# Patient Record
Sex: Female | Born: 1950 | Race: White | Hispanic: No | Smoking: Never smoker
Health system: Southern US, Community
[De-identification: ages and names within clinical notes are randomized; demographics above are authoritative.]

## PROBLEM LIST (undated history)

## (undated) DIAGNOSIS — F411 Generalized anxiety disorder: Secondary | ICD-10-CM

## (undated) DIAGNOSIS — E785 Hyperlipidemia, unspecified: Secondary | ICD-10-CM

## (undated) DIAGNOSIS — R61 Generalized hyperhidrosis: Secondary | ICD-10-CM

## (undated) DIAGNOSIS — J209 Acute bronchitis, unspecified: Secondary | ICD-10-CM

## (undated) DIAGNOSIS — E039 Hypothyroidism, unspecified: Secondary | ICD-10-CM

## (undated) DIAGNOSIS — R269 Unspecified abnormalities of gait and mobility: Secondary | ICD-10-CM

## (undated) DIAGNOSIS — G245 Blepharospasm: Secondary | ICD-10-CM

## (undated) DIAGNOSIS — C801 Malignant (primary) neoplasm, unspecified: Secondary | ICD-10-CM

## (undated) DIAGNOSIS — G4700 Insomnia, unspecified: Secondary | ICD-10-CM

## (undated) DIAGNOSIS — E669 Obesity, unspecified: Secondary | ICD-10-CM

## (undated) DIAGNOSIS — G244 Idiopathic orofacial dystonia: Secondary | ICD-10-CM

## (undated) DIAGNOSIS — K219 Gastro-esophageal reflux disease without esophagitis: Secondary | ICD-10-CM

## (undated) DIAGNOSIS — F329 Major depressive disorder, single episode, unspecified: Secondary | ICD-10-CM

## (undated) DIAGNOSIS — R002 Palpitations: Secondary | ICD-10-CM

## (undated) DIAGNOSIS — H269 Unspecified cataract: Secondary | ICD-10-CM

## (undated) HISTORY — DX: Insomnia, unspecified: G47.00

## (undated) HISTORY — PX: TONSILLECTOMY: SUR1361

## (undated) HISTORY — DX: Acute bronchitis, unspecified: J20.9

## (undated) HISTORY — DX: Obesity, unspecified: E66.9

## (undated) HISTORY — DX: Generalized anxiety disorder: F41.1

## (undated) HISTORY — DX: Hyperlipidemia, unspecified: E78.5

## (undated) HISTORY — DX: Major depressive disorder, single episode, unspecified: F32.9

## (undated) HISTORY — DX: Malignant (primary) neoplasm, unspecified: C80.1

## (undated) HISTORY — DX: Palpitations: R00.2

## (undated) HISTORY — DX: Blepharospasm: G24.5

## (undated) HISTORY — DX: Idiopathic orofacial dystonia: G24.4

## (undated) HISTORY — DX: Gastro-esophageal reflux disease without esophagitis: K21.9

## (undated) HISTORY — DX: Generalized hyperhidrosis: R61

## (undated) HISTORY — DX: Unspecified abnormalities of gait and mobility: R26.9

## (undated) HISTORY — DX: Hypothyroidism, unspecified: E03.9

## (undated) HISTORY — PX: COLONOSCOPY: SHX174

## (undated) HISTORY — DX: Unspecified cataract: H26.9

---

## 1997-08-14 HISTORY — PX: THYROIDECTOMY: SHX17

## 1999-03-31 ENCOUNTER — Other Ambulatory Visit: Admission: RE | Admit: 1999-03-31 | Discharge: 1999-03-31 | Payer: Self-pay | Admitting: Obstetrics & Gynecology

## 1999-12-19 ENCOUNTER — Ambulatory Visit (HOSPITAL_COMMUNITY): Admission: RE | Admit: 1999-12-19 | Discharge: 1999-12-19 | Payer: Self-pay | Admitting: Endocrinology

## 1999-12-19 ENCOUNTER — Encounter: Payer: Self-pay | Admitting: Endocrinology

## 2000-01-23 ENCOUNTER — Encounter: Payer: Self-pay | Admitting: Endocrinology

## 2000-01-23 ENCOUNTER — Ambulatory Visit (HOSPITAL_COMMUNITY): Admission: RE | Admit: 2000-01-23 | Discharge: 2000-01-23 | Payer: Self-pay | Admitting: Endocrinology

## 2000-02-09 ENCOUNTER — Other Ambulatory Visit: Admission: RE | Admit: 2000-02-09 | Discharge: 2000-02-09 | Payer: Self-pay | Admitting: Endocrinology

## 2000-03-05 ENCOUNTER — Encounter (INDEPENDENT_AMBULATORY_CARE_PROVIDER_SITE_OTHER): Payer: Self-pay | Admitting: *Deleted

## 2000-03-05 ENCOUNTER — Ambulatory Visit (HOSPITAL_COMMUNITY): Admission: RE | Admit: 2000-03-05 | Discharge: 2000-03-07 | Payer: Self-pay

## 2000-07-09 ENCOUNTER — Encounter: Payer: Self-pay | Admitting: Endocrinology

## 2000-07-09 ENCOUNTER — Ambulatory Visit (HOSPITAL_COMMUNITY): Admission: RE | Admit: 2000-07-09 | Discharge: 2000-07-09 | Payer: Self-pay | Admitting: Endocrinology

## 2000-07-11 ENCOUNTER — Ambulatory Visit (HOSPITAL_COMMUNITY): Admission: RE | Admit: 2000-07-11 | Discharge: 2000-07-11 | Payer: Self-pay | Admitting: Endocrinology

## 2001-07-01 ENCOUNTER — Ambulatory Visit (HOSPITAL_COMMUNITY): Admission: RE | Admit: 2001-07-01 | Discharge: 2001-07-01 | Payer: Self-pay | Admitting: Endocrinology

## 2001-07-01 ENCOUNTER — Encounter: Payer: Self-pay | Admitting: Endocrinology

## 2001-07-05 ENCOUNTER — Ambulatory Visit (HOSPITAL_COMMUNITY): Admission: RE | Admit: 2001-07-05 | Discharge: 2001-07-05 | Payer: Self-pay | Admitting: Endocrinology

## 2001-07-05 ENCOUNTER — Encounter: Payer: Self-pay | Admitting: Endocrinology

## 2002-02-25 ENCOUNTER — Other Ambulatory Visit: Admission: RE | Admit: 2002-02-25 | Discharge: 2002-02-25 | Payer: Self-pay | Admitting: Obstetrics & Gynecology

## 2002-05-12 ENCOUNTER — Encounter: Payer: Self-pay | Admitting: Endocrinology

## 2002-05-12 ENCOUNTER — Ambulatory Visit (HOSPITAL_COMMUNITY): Admission: RE | Admit: 2002-05-12 | Discharge: 2002-05-12 | Payer: Self-pay | Admitting: Endocrinology

## 2002-05-16 ENCOUNTER — Ambulatory Visit (HOSPITAL_COMMUNITY): Admission: RE | Admit: 2002-05-16 | Discharge: 2002-05-16 | Payer: Self-pay | Admitting: Endocrinology

## 2003-06-22 ENCOUNTER — Ambulatory Visit (HOSPITAL_COMMUNITY): Admission: RE | Admit: 2003-06-22 | Discharge: 2003-06-22 | Payer: Self-pay | Admitting: Specialist

## 2003-06-22 ENCOUNTER — Encounter (INDEPENDENT_AMBULATORY_CARE_PROVIDER_SITE_OTHER): Payer: Self-pay | Admitting: *Deleted

## 2003-06-22 ENCOUNTER — Ambulatory Visit (HOSPITAL_BASED_OUTPATIENT_CLINIC_OR_DEPARTMENT_OTHER): Admission: RE | Admit: 2003-06-22 | Discharge: 2003-06-22 | Payer: Self-pay | Admitting: Specialist

## 2003-12-15 ENCOUNTER — Other Ambulatory Visit: Admission: RE | Admit: 2003-12-15 | Discharge: 2003-12-15 | Payer: Self-pay | Admitting: Obstetrics & Gynecology

## 2004-06-21 ENCOUNTER — Ambulatory Visit: Payer: Self-pay | Admitting: Internal Medicine

## 2004-08-11 ENCOUNTER — Ambulatory Visit: Payer: Self-pay | Admitting: Internal Medicine

## 2004-11-03 ENCOUNTER — Ambulatory Visit: Payer: Self-pay | Admitting: Internal Medicine

## 2005-03-17 ENCOUNTER — Ambulatory Visit: Payer: Self-pay | Admitting: Internal Medicine

## 2005-03-17 ENCOUNTER — Ambulatory Visit (HOSPITAL_COMMUNITY): Admission: RE | Admit: 2005-03-17 | Discharge: 2005-03-17 | Payer: Self-pay | Admitting: Internal Medicine

## 2005-05-31 ENCOUNTER — Ambulatory Visit: Payer: Self-pay | Admitting: Internal Medicine

## 2005-06-15 ENCOUNTER — Other Ambulatory Visit: Admission: RE | Admit: 2005-06-15 | Discharge: 2005-06-15 | Payer: Self-pay | Admitting: Obstetrics & Gynecology

## 2005-07-13 ENCOUNTER — Inpatient Hospital Stay (HOSPITAL_COMMUNITY): Admission: EM | Admit: 2005-07-13 | Discharge: 2005-07-16 | Payer: Self-pay | Admitting: Emergency Medicine

## 2005-07-13 ENCOUNTER — Ambulatory Visit: Payer: Self-pay | Admitting: Internal Medicine

## 2005-07-24 ENCOUNTER — Ambulatory Visit: Payer: Self-pay | Admitting: Internal Medicine

## 2005-08-08 ENCOUNTER — Ambulatory Visit: Payer: Self-pay | Admitting: Internal Medicine

## 2005-08-14 DIAGNOSIS — A0472 Enterocolitis due to Clostridium difficile, not specified as recurrent: Secondary | ICD-10-CM

## 2005-08-14 HISTORY — DX: Enterocolitis due to Clostridium difficile, not specified as recurrent: A04.72

## 2005-08-14 LAB — HM COLONOSCOPY

## 2005-08-16 ENCOUNTER — Ambulatory Visit: Payer: Self-pay | Admitting: Internal Medicine

## 2005-08-23 ENCOUNTER — Ambulatory Visit: Payer: Self-pay | Admitting: Internal Medicine

## 2005-09-05 ENCOUNTER — Ambulatory Visit: Payer: Self-pay | Admitting: Internal Medicine

## 2005-10-05 ENCOUNTER — Ambulatory Visit: Payer: Self-pay | Admitting: Internal Medicine

## 2005-10-24 ENCOUNTER — Ambulatory Visit: Payer: Self-pay | Admitting: Internal Medicine

## 2005-11-08 ENCOUNTER — Ambulatory Visit: Payer: Self-pay | Admitting: Internal Medicine

## 2005-12-01 ENCOUNTER — Ambulatory Visit: Payer: Self-pay | Admitting: Internal Medicine

## 2005-12-01 ENCOUNTER — Encounter: Payer: Self-pay | Admitting: Internal Medicine

## 2005-12-01 ENCOUNTER — Encounter (INDEPENDENT_AMBULATORY_CARE_PROVIDER_SITE_OTHER): Payer: Self-pay | Admitting: Specialist

## 2006-01-01 ENCOUNTER — Ambulatory Visit: Payer: Self-pay | Admitting: Internal Medicine

## 2006-09-18 ENCOUNTER — Ambulatory Visit: Payer: Self-pay | Admitting: Internal Medicine

## 2007-01-17 ENCOUNTER — Ambulatory Visit: Payer: Self-pay | Admitting: Internal Medicine

## 2007-01-17 LAB — CONVERTED CEMR LAB
ALT: 20 units/L (ref 0–40)
AST: 22 units/L (ref 0–37)
Bacteria, UA: NEGATIVE
Basophils Relative: 0.6 % (ref 0.0–1.0)
Bilirubin Urine: NEGATIVE
Bilirubin, Direct: 0.1 mg/dL (ref 0.0–0.3)
Calcium: 9.3 mg/dL (ref 8.4–10.5)
Chloride: 109 meq/L (ref 96–112)
Cholesterol: 244 mg/dL (ref 0–200)
Creatinine, Ser: 0.8 mg/dL (ref 0.4–1.2)
Crystals: NEGATIVE
Eosinophils Relative: 3.9 % (ref 0.0–5.0)
GFR calc non Af Amer: 79 mL/min
Glucose, Bld: 96 mg/dL (ref 70–99)
HCT: 40.7 % (ref 36.0–46.0)
Hemoglobin: 14.5 g/dL (ref 12.0–15.0)
Lymphocytes Relative: 36.3 % (ref 12.0–46.0)
Monocytes Absolute: 0.6 10*3/uL (ref 0.2–0.7)
Monocytes Relative: 9.5 % (ref 3.0–11.0)
Neutro Abs: 2.9 10*3/uL (ref 1.4–7.7)
Neutrophils Relative %: 49.7 % (ref 43.0–77.0)
Nitrite: NEGATIVE
RDW: 12.6 % (ref 11.5–14.6)
Specific Gravity, Urine: 1.025 (ref 1.000–1.03)
Total Bilirubin: 0.9 mg/dL (ref 0.3–1.2)
Total CHOL/HDL Ratio: 7.8
Total Protein, Urine: NEGATIVE mg/dL
Urine Glucose: NEGATIVE mg/dL
Urobilinogen, UA: 0.2 (ref 0.0–1.0)
VLDL: 50 mg/dL — ABNORMAL HIGH (ref 0–40)

## 2007-01-30 ENCOUNTER — Ambulatory Visit: Payer: Self-pay | Admitting: Internal Medicine

## 2007-08-26 ENCOUNTER — Encounter: Payer: Self-pay | Admitting: Internal Medicine

## 2007-12-04 ENCOUNTER — Encounter: Payer: Self-pay | Admitting: Internal Medicine

## 2008-02-21 ENCOUNTER — Encounter: Payer: Self-pay | Admitting: Internal Medicine

## 2008-03-17 ENCOUNTER — Ambulatory Visit: Payer: Self-pay | Admitting: Internal Medicine

## 2008-03-17 DIAGNOSIS — E669 Obesity, unspecified: Secondary | ICD-10-CM

## 2008-03-17 DIAGNOSIS — R61 Generalized hyperhidrosis: Secondary | ICD-10-CM

## 2008-03-17 DIAGNOSIS — F329 Major depressive disorder, single episode, unspecified: Secondary | ICD-10-CM

## 2008-03-17 DIAGNOSIS — F411 Generalized anxiety disorder: Secondary | ICD-10-CM

## 2008-03-17 DIAGNOSIS — F3289 Other specified depressive episodes: Secondary | ICD-10-CM

## 2008-03-17 DIAGNOSIS — F4323 Adjustment disorder with mixed anxiety and depressed mood: Secondary | ICD-10-CM | POA: Insufficient documentation

## 2008-03-17 DIAGNOSIS — E039 Hypothyroidism, unspecified: Secondary | ICD-10-CM

## 2008-03-17 DIAGNOSIS — G4700 Insomnia, unspecified: Secondary | ICD-10-CM | POA: Insufficient documentation

## 2008-03-17 DIAGNOSIS — F419 Anxiety disorder, unspecified: Secondary | ICD-10-CM | POA: Insufficient documentation

## 2008-03-17 HISTORY — DX: Insomnia, unspecified: G47.00

## 2008-03-17 HISTORY — DX: Generalized anxiety disorder: F41.1

## 2008-03-17 HISTORY — DX: Generalized hyperhidrosis: R61

## 2008-03-17 HISTORY — DX: Other specified depressive episodes: F32.89

## 2008-03-17 HISTORY — DX: Major depressive disorder, single episode, unspecified: F32.9

## 2008-03-17 HISTORY — DX: Obesity, unspecified: E66.9

## 2008-03-17 HISTORY — DX: Hypothyroidism, unspecified: E03.9

## 2008-03-23 ENCOUNTER — Ambulatory Visit: Payer: Self-pay | Admitting: Internal Medicine

## 2008-03-24 LAB — CONVERTED CEMR LAB
Alkaline Phosphatase: 81 units/L (ref 39–117)
Bilirubin Urine: NEGATIVE
Bilirubin, Direct: 0.1 mg/dL (ref 0.0–0.3)
CO2: 28 meq/L (ref 19–32)
Crystals: NEGATIVE
GFR calc Af Amer: 95 mL/min
Glucose, Bld: 98 mg/dL (ref 70–99)
HDL: 32.3 mg/dL — ABNORMAL LOW (ref >39.0)
Lymphocytes Relative: 37.7 % (ref 12.0–46.0)
Monocytes Relative: 6.8 % (ref 3.0–12.0)
Mucus, UA: NEGATIVE
Neutrophils Relative %: 51.7 % (ref 43.0–77.0)
Nitrite: NEGATIVE
Platelets: 340 10*3/uL (ref 150–400)
Potassium: 4.1 meq/L (ref 3.5–5.1)
RDW: 12.7 % (ref 11.5–14.6)
Sodium: 144 meq/L (ref 135–145)
Total CHOL/HDL Ratio: 7
Total Protein: 7.2 g/dL (ref 6.0–8.3)
Triglycerides: 336 mg/dL (ref 0–149)
pH: 5.5 (ref 5.0–8.0)

## 2008-10-01 ENCOUNTER — Ambulatory Visit: Payer: Self-pay | Admitting: Internal Medicine

## 2008-10-01 DIAGNOSIS — J209 Acute bronchitis, unspecified: Secondary | ICD-10-CM

## 2008-10-01 DIAGNOSIS — R002 Palpitations: Secondary | ICD-10-CM

## 2008-10-01 HISTORY — DX: Palpitations: R00.2

## 2008-10-01 HISTORY — DX: Acute bronchitis, unspecified: J20.9

## 2009-03-31 ENCOUNTER — Encounter: Payer: Self-pay | Admitting: Internal Medicine

## 2009-06-11 ENCOUNTER — Ambulatory Visit: Payer: Self-pay | Admitting: Internal Medicine

## 2009-06-11 DIAGNOSIS — G245 Blepharospasm: Secondary | ICD-10-CM

## 2009-06-11 DIAGNOSIS — R269 Unspecified abnormalities of gait and mobility: Secondary | ICD-10-CM

## 2009-06-11 HISTORY — DX: Unspecified abnormalities of gait and mobility: R26.9

## 2009-06-11 HISTORY — DX: Blepharospasm: G24.5

## 2009-06-11 LAB — CONVERTED CEMR LAB: Vit D, 25-Hydroxy: 37 ng/mL (ref 30–89)

## 2009-06-14 LAB — CONVERTED CEMR LAB
ALT: 27 units/L (ref 0–35)
BUN: 12 mg/dL (ref 6–23)
Basophils Relative: 0.9 % (ref 0.0–3.0)
Chloride: 104 meq/L (ref 96–112)
Cholesterol: 218 mg/dL — ABNORMAL HIGH (ref 0–200)
Eosinophils Relative: 4.3 % (ref 0.0–5.0)
HCT: 39.9 % (ref 36.0–46.0)
Hemoglobin: 13.7 g/dL (ref 12.0–15.0)
Lymphs Abs: 2.7 10*3/uL (ref 0.7–4.0)
MCV: 97.4 fL (ref 78.0–100.0)
Monocytes Absolute: 0.5 10*3/uL (ref 0.1–1.0)
Neutro Abs: 4.2 10*3/uL (ref 1.4–7.7)
Platelets: 287 10*3/uL (ref 150.0–400.0)
Potassium: 4 meq/L (ref 3.5–5.1)
RBC: 4.1 M/uL (ref 3.87–5.11)
Total Protein: 7.6 g/dL (ref 6.0–8.3)
Triglycerides: 329 mg/dL — ABNORMAL HIGH (ref 0.0–149.0)
WBC: 7.8 10*3/uL (ref 4.5–10.5)

## 2009-06-29 ENCOUNTER — Ambulatory Visit: Payer: Self-pay | Admitting: Internal Medicine

## 2009-07-05 ENCOUNTER — Telehealth (INDEPENDENT_AMBULATORY_CARE_PROVIDER_SITE_OTHER): Payer: Self-pay | Admitting: *Deleted

## 2009-07-12 ENCOUNTER — Encounter: Payer: Self-pay | Admitting: Internal Medicine

## 2010-06-24 ENCOUNTER — Ambulatory Visit: Payer: Self-pay | Admitting: Internal Medicine

## 2010-08-02 ENCOUNTER — Encounter: Payer: Self-pay | Admitting: Internal Medicine

## 2010-09-11 LAB — CONVERTED CEMR LAB: TSH: 0.08 microintl units/mL — ABNORMAL LOW (ref 0.35–5.50)

## 2010-09-13 NOTE — Assessment & Plan Note (Signed)
Summary: rx refill-lb   Vital Signs:  Patient profile:   60 year old female Height:      64 inches Weight:      187 pounds BMI:     32.21 Temp:     98.4 degrees F oral Pulse rate:   72 / minute Pulse rhythm:   regular Resp:     16 per minute BP sitting:   114 / 86  (left arm) Cuff size:   regular  Vitals Entered By: Lanier Prude, Beverly Gust) (June 24, 2010 4:48 PM) CC: f/u Is Patient Diabetic? No Comments pt needs rf on Levothyroxine   CC:  f/u.  History of Present Illness: F/u hypothyroidism   Current Medications (verified): 1)  Levothyroxine Sodium 125 Mcg Tabs (Levothyroxine Sodium) .[name].. 1 By Mouth Once Daily 2)  Alprazolam 0.5 Mg  Tabs (Alprazolam) .... 1/2 Tab Once Daily Prn 3)  Vitamin D3 1000 Unit  Tabs (Cholecalciferol) .[name].. 1 By Mouth Daily 4)  Tylenol Pm Extra Strength 500-25 Mg  Tabs (Diphenhydramine-Apap (Sleep)) .[name].. 1 At [facility] Prn 5)  Carbidopa-Levodopa 10-100 Mg Tabs (Carbidopa-Levodopa) .[name].. 1 By Mouth Bid 6)  Lexapro 10 Mg Tabs (Escitalopram Oxalate) .... Take 1 Tab By Mouth Daily 7)  Tussionex Pennkinetic Er 8-10 Mg/10ml Lqcr (Chlorpheniramine-Hydrocodone) .... 5 Ml By Mouth Bib As Needed Cough  Allergies (verified): 1)  ! Penicillin V Potassium (Penicillin V Potassium)  Review of Systems       Lost wt on diet  Physical Exam  General:  alert and less overweight-appearing Mouth:  Erythematous throat mucosa and intranasal erythema.  Neck:  No deformities, masses, or tenderness noted. Lungs:  Normal respiratory effort, chest expands symmetrically. Lungs are clear to auscultation, no crackles or wheezes. Heart:  Normal rate and regular rhythm. S1 and S2 normal without gallop, murmur, click, rub or other extra sounds. Extremities:  no edema   Impression & Recommendations:  Problem # 1:  HYPOTHYROIDISM (ICD-244.9) Assessment Unchanged  Her updated medication list for this problem includes:    Levothyroxine Sodium 125 Mcg Tabs (Levothyroxine  sodium) .[name]... 1 by mouth once daily  Complete Medication List: 1)  Levothyroxine Sodium 125 Mcg Tabs (Levothyroxine sodium) .[name].. 1 by mouth once daily 2)  Alprazolam 0.5 Mg Tabs (Alprazolam) .... 1/2 tab once daily prn 3)  Vitamin D3 1000 Unit Tabs (Cholecalciferol) .[name].. 1 by mouth daily 4)  Tylenol Pm Extra Strength 500-25 Mg Tabs (Diphenhydramine-apap (sleep)) .[name].. 1 at hs prn 5)  Zoloft 100 Mg Tabs (Sertraline hcl) .[name].. 1 by mouth qd  Patient Instructions: 1)  Please schedule a follow-up appointment in 3 months well w/labs. Prescriptions: LEVOTHYROXINE SODIUM 125 MCG TABS (LEVOTHYROXINE SODIUM) 1 by mouth once daily  #90 Tablet x 3   Entered and Authorized by:   Tresa Garter MD   Signed by:   Tresa Garter MD on 06/24/2010   Method used:   [facility]        [facility] [id number]       [address]       [place], [place]  [address]       Ph: [phone] or [phone]       Fax: [phone]   RxID:   [id number]    Orders Added: 1)  Est. Patient Level III [10626]   Immunization History:  Influenza Immunization History:    Influenza:  historical (04/28/2010)   Immunization History:  Influenza Immunization History:    Influenza:  Historical (04/28/2010)

## 2010-09-23 ENCOUNTER — Encounter (INDEPENDENT_AMBULATORY_CARE_PROVIDER_SITE_OTHER): Payer: Self-pay | Admitting: *Deleted

## 2010-09-23 ENCOUNTER — Other Ambulatory Visit: Payer: BC Managed Care – PPO

## 2010-09-23 ENCOUNTER — Other Ambulatory Visit: Payer: Self-pay | Admitting: Internal Medicine

## 2010-09-23 DIAGNOSIS — E785 Hyperlipidemia, unspecified: Secondary | ICD-10-CM

## 2010-09-23 DIAGNOSIS — Z0000 Encounter for general adult medical examination without abnormal findings: Secondary | ICD-10-CM

## 2010-09-23 LAB — BASIC METABOLIC PANEL
CO2: 30 mEq/L (ref 19–32)
Calcium: 9.3 mg/dL (ref 8.4–10.5)
Chloride: 106 mEq/L (ref 96–112)
Sodium: 142 mEq/L (ref 135–145)

## 2010-09-23 LAB — CBC WITH DIFFERENTIAL/PLATELET
Basophils Relative: 0.6 % (ref 0.0–3.0)
Eosinophils Absolute: 0.2 10*3/uL (ref 0.0–0.7)
Eosinophils Relative: 3.5 % (ref 0.0–5.0)
Hemoglobin: 14.1 g/dL (ref 12.0–15.0)
Lymphocytes Relative: 43.7 % (ref 12.0–46.0)
MCHC: 35.1 g/dL (ref 30.0–36.0)
MCV: 95.5 fl (ref 78.0–100.0)
Neutro Abs: 2.9 10*3/uL (ref 1.4–7.7)
RBC: 4.2 Mil/uL (ref 3.87–5.11)
WBC: 6.5 10*3/uL (ref 4.5–10.5)

## 2010-09-23 LAB — URINALYSIS
Bilirubin Urine: NEGATIVE
Hgb urine dipstick: NEGATIVE
Ketones, ur: NEGATIVE
Leukocytes, UA: NEGATIVE
Nitrite: NEGATIVE
pH: 8 (ref 5.0–8.0)

## 2010-09-23 LAB — LIPID PANEL
Cholesterol: 219 mg/dL — ABNORMAL HIGH (ref 0–200)
HDL: 36.6 mg/dL — ABNORMAL LOW (ref >39.00)
Total CHOL/HDL Ratio: 6
Triglycerides: 203 mg/dL — ABNORMAL HIGH (ref 0.0–149.0)
VLDL: 40.6 mg/dL — ABNORMAL HIGH (ref 0.0–40.0)

## 2010-09-23 LAB — HEPATIC FUNCTION PANEL
ALT: 17 U/L (ref 0–35)
Albumin: 3.8 g/dL (ref 3.5–5.2)
Alkaline Phosphatase: 65 U/L (ref 39–117)
Total Protein: 6.7 g/dL (ref 6.0–8.3)

## 2010-09-26 ENCOUNTER — Encounter: Payer: Self-pay | Admitting: Internal Medicine

## 2010-09-26 ENCOUNTER — Encounter (INDEPENDENT_AMBULATORY_CARE_PROVIDER_SITE_OTHER): Payer: BC Managed Care – PPO | Admitting: Internal Medicine

## 2010-09-26 DIAGNOSIS — Z0000 Encounter for general adult medical examination without abnormal findings: Secondary | ICD-10-CM

## 2010-09-27 ENCOUNTER — Encounter: Payer: Self-pay | Admitting: Internal Medicine

## 2010-10-11 NOTE — Assessment & Plan Note (Signed)
Summary: CPX  BCBS   #  CD   Vital Signs:  Patient profile:   60 year old female Weight:      182 pounds O2 Sat:      98 % Pulse rate:   72 / minute BP sitting:   120 / 80  History of Present Illness: The patient presents for a preventive health examination   Allergies: 1)  ! Penicillin V Potassium (Penicillin V Potassium)  Past History:  Social History: Last updated: 03/17/2008 Occupation: HR manager Married Never Smoked  Past Medical History: Blepharospasm  Dr Debarah Crape Anxiety Depression Dr Nolen Mu Hypothyroidism, post XRT, surgery  Dr Juleen China (Thyroid ca 1999)  Past Surgical History: Thyroidectomy 1999  Family History: Family History Hypertension Family History Depression Family History Diabetes 1st degree relative  Review of Systems  The patient denies anorexia, fever, weight loss, weight gain, vision loss, decreased hearing, hoarseness, chest pain, syncope, dyspnea on exertion, peripheral edema, prolonged cough, headaches, hemoptysis, abdominal pain, melena, hematochezia, severe indigestion/heartburn, hematuria, incontinence, genital sores, muscle weakness, suspicious skin lesions, transient blindness, difficulty walking, depression, unusual weight change, abnormal bleeding, enlarged lymph nodes, angioedema, and breast masses.         Lost wt on diet  Physical Exam  General:  alert and less overweight-appearing Head:  Normocephalic and atraumatic without obvious abnormalities. No apparent alopecia or balding. Eyes:  No corneal or conjunctival inflammation noted. EOMI. Perrla. Ears:  External ear exam shows no significant lesions or deformities.  Otoscopic examination reveals clear canals, tympanic membranes are intact bilaterally without bulging, retraction, inflammation or discharge. Hearing is grossly normal bilaterally. Nose:  External nasal examination shows no deformity or inflammation. Nasal mucosa are pink and moist without lesions or exudates. Mouth:   Erythematous throat mucosa and intranasal erythema.  Neck:  No deformities, masses, or tenderness noted. Lungs:  Normal respiratory effort, chest expands symmetrically. Lungs are clear to auscultation, no crackles or wheezes. Heart:  Normal rate and regular rhythm. S1 and S2 normal without gallop, murmur, click, rub or other extra sounds. Abdomen:  Bowel sounds positive,abdomen soft and non-tender without masses, organomegaly or hernias noted. Msk:  No deformity or scoliosis noted of thoracic or lumbar spine.   Pulses:  R and L carotid,radial,femoral,dorsalis pedis and posterior tibial pulses are full and equal bilaterally Extremities:  No clubbing, cyanosis, edema, or deformity noted with normal full range of motion of all joints.   Neurologic:  No cranial nerve deficits noted. Station and gait are normal. Plantar reflexes are down-going bilaterally. DTRs are symmetrical throughout. Sensory, motor and coordinative functions appear intact. Skin:  Intact without suspicious lesions or rashes Cervical Nodes:  No lymphadenopathy noted Psych:  Cognition and judgment appear intact. Alert and cooperative with normal attention span and concentration. No apparent delusions, illusions, hallucinations   Impression & Recommendations:  Problem # 1:  HEALTH MAINTENANCE EXAM (ICD-V70.0) Health and age related issues were discussed. Available screening tests and vaccinations were discussed as well. Healthy life style including good diet and exercise was discussed.  The labs were reviewed with the patient.  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 1 by mouth once daily Her updated medication list for this problem includes:    Synthroid  100 Mcg Tabs (Levothyroxine sodium) .Marland KitchenMarland KitchenMarland KitchenMarland Kitchen  1 by mouth qd  Problem # 4:  DEPRESSION (ICD-311) Assessment: Improved  Her updated medication list for this problem includes:    Alprazolam 0.5 Mg Tabs (Alprazolam) .[name]... 1/2 tab once daily prn    Zoloft 100 Mg Tabs (Sertraline hcl) .[name]... 1 by mouth qd  Problem # 5:  ANXIETY (ICD-300.00) Assessment: Improved  Her updated medication list for this problem includes:    Alprazolam 0.5 Mg Tabs (Alprazolam) .[name]... 1/2 tab once daily prn    Zoloft 100 Mg Tabs (Sertraline hcl) .[name]... 1 by mouth qd  Problem # 6:  SWEATING (ICD-780.8) Assessment: Improved  Complete Medication List: 1)  Alprazolam 0.5 Mg Tabs (Alprazolam) .... 1/2 tab once daily prn 2)  Zoloft 100 Mg Tabs (Sertraline hcl) .[name].. 1 by mouth qd 3)  Synthroid 100 Mcg Tabs (Levothyroxine sodium) .[name].. 1 by mouth qd 4)  Zolpidem Tartrate 10 Mg Tabs (Zolpidem tartrate) .... 1/2-1 tab at bedtime as needed insomnia 5)  Vitamin D3 1000 Unit Tabs (Cholecalciferol) .[name].. 1 by mouth daily 6)  Tylenol Pm Extra Strength 500-25 Mg Tabs (Diphenhydramine-apap (sleep)) .[name].. 1 at hs prn 7)  Senokot S 8.6-50 Mg Tabs (Sennosides-docusate sodium) .[name].. 1-2 a day 8)  Miralax Powd (Polyethylene glycol 3350) .[name].. 1 scoop once daily as needed for constipation  Patient Instructions: 1)  TSH prior to visit, ICD-9:244.80 2)  Please schedule a follow-up appointment in 6 months. 3)  Valerian root Prescriptions: SYNTHROID 100 MCG TABS (LEVOTHYROXINE SODIUM) 1 by mouth qd Brand medically necessary #30 x 11   Entered and Authorized by:   Tresa Garter MD   Signed by:   Tresa Garter MD on 09/26/2010   Method used:   [facility]        [facility] [id number]       [address]       [place], [place]  [address]       Ph: [phone] or [phone]       Fax: [phone]   RxID:   [id number] MIRALAX  POWD (POLYETHYLENE GLYCOL 3350) 1 scoop once daily as needed for constipation   #1 x 12   Entered and Authorized by:   Tresa Garter MD   Signed by:   Tresa Garter MD on 09/26/2010   Method used:   [facility]        [phone][facility] (retail)       [address]       [place], [place]  [address]       Ph: [phone]       Fax: [phone]   RxID:   [id number] SYNTHROID 100 MCG TABS (LEVOTHYROXINE SODIUM) 1 by mouth qd Brand medically necessary #30 x 11   Entered and Authorized by:   Tresa Garter MD   Signed by:   Tresa Garter MD on 09/26/2010   Method used:   [facility]        [facility] [phone] (retail[facility]       [address]       [place], [place]  [address]       Ph: [phone]       Fax: [phone]   RxID:   [id number]    Orders Added: 1)  Est. Patient 40-64 years [[address]]    Contraindications/Deferment of Procedures/Staging:    Test/Procedure: DPT vaccine  Reason for deferment: declined

## 2010-10-26 ENCOUNTER — Other Ambulatory Visit: Payer: BC Managed Care – PPO

## 2010-12-27 NOTE — Assessment & Plan Note (Signed)
[facility]                           PRIMARY CARE OFFICE NOTE   NAME:Linda Hayes, Linda Hayes                       MRN:          [id number]  DATE:01/30/2007                            DOB:          1951-07-03    The patient is a 60 year old female who presents for a wellness  examination.   PAST MEDICAL HISTORY:  Severe C.diff colitis a couple of years ago,  history of panic attacks, remote history of rheumatic fever, irritable  bowel disease, GERD with hiatal hernia, fibromyalgia, history of thyroid  cancer in 2001, status post thyroidectomy, history of cervical  dysplasia, status post conization, a remote history of mononucleosis.   FAMILY HISTORY:  Father with coronary disease, diabetes, deceased.  Mother is alive with hypertension, depression, and anxiety.   CURRENT MEDICATIONS:  1. Claritin p.r.n.  2. Synthroid 150 mg daily.  3. Zoloft 100 mg daily.   SOCIAL HISTORY:  She does not smoke or drink.  She is married.  Her  stepson was killed in a car accident 2 weeks ago.   HISTORY OF PRESENT ILLNESS:  She has been stressed out lately, gaining  weight, not able to exercise.  Denies chest pain, shortness of breath,  no syncope, no neurological complaints.  The rest of the 18-point review  of systems is negative.  She does have regular GYN care by Dr. Aldona Bar.   PHYSICAL EXAMINATION:  Blood pressure 119/65, pulse 85, temp 98.3,  weight 200 pounds.  She looks well.  HEENT:  Moist mucosa.  NECK:  Supple, no thyromegaly or bruit.  LUNGS:  Clear to auscultation and percussion, no wheezes or rales.  CARDIAC:  S1, S2, no murmur, no gallop.  ABDOMEN:  Soft, nontender, no organomegaly, no mass.  EXTREMITIES:  Lower extremities without edema.  NEUROLOGIC:  She is alert, oriented, and cooperative.  Denies being  depressed.  NECK:  Supple, no masses.  SKIN:  Clear.   LABORATORY DATA:  On January 17, 2007, CBC normal, BMET normal, cholesterol  244, LDL of 160,  TSH 0.15.   EKG with normal sinus rhythm.   Urinalysis normal.   ASSESSMENT AND PLAN:  1. Normal wellness examination.  Age/health related issues discussed.      Healthy lifestyle discussed. Regular GYN care with Dr. Aldona Bar.      Mammogram every year.  She is up to date on colonoscopy exam.      Advised a Tetanus shot.  She will check at home when she had it      last.  I will see her back in 3 months.  2. Weight gain due to stress-related overeating.  She has been on      Zoloft chronically.  She will try to stay on low-calorie      diet/smaller portions.  Will start phentermine 37.5 mg daily in the      morning #30 with 2 refills.  The risks, side effects, benefits, all      discussed.  She will discontinue if chest pain, shortness of      breath, syncope, weakness, etc.  3. Hypothyroidism  status post thyroidectomy for thyroid cancer in      2001.  She has been seeing Dr. Juleen China.  At present will reduce      Synthroid to 137 mcg per day and will recheck TSH in 2-3 months.     Georgina Quint. Plotnikov, MD  Electronically Signed    AVP/MedQ  DD: 01/31/2007  DT: 01/31/2007  Job #: [id number]   cc:   Gerrit Friends. Aldona Bar, M.D.  Brooke Bonito, M.D.

## 2010-12-30 NOTE — Discharge Summary (Signed)
[name], [name] NO.:  [id number]   MEDICAL RECORD NO.:  [id number]          PATIENT TYPE:  INP   LOCATION:  1326                         FACILITY:  [facility]   PHYSICIAN:  Wanda Plump, MD [facility]    DATE OF BIRTH:  12/31/1950   DATE OF ADMISSION:  07/13/2005  DATE OF DISCHARGE:  07/16/2005                                 DISCHARGE SUMMARY   PRIMARY CARE PHYSICIAN:  Georgina Quint. Plotnikov, M.D.   ADMISSION DIAGNOSIS:  Diarrhea, abdominal pain and vomiting.   DISCHARGE DIAGNOSIS:  C Dif colitis.   BRIEF HISTORY AND PHYSICAL:  [name] is a 60 year old female who  developed nausea, vomiting, and diarrhea with mucus, abdominal pain, fever  and chills before the admission to the hospital. She was undergoing an  evaluation by Dr. Brunilda Payor for recurrent urinary tract infection and a CAT scan  was done that revealed pancolitis.   PHYSICAL EXAMINATION:  VITAL SIGNS:  Temperature 101.2, heart rate 110,  respirations 10.  LUNGS:  Clear.  CARDIOVASCULAR:  Regular rate and rhythm.  ABDOMEN:  Soft, very sensitive throughout but rebound.   LABORATORY AND X-RAYS:  C Dif of the abdomen showed pancolitis. Initial CBC  showed a white count of 16.4, hemoglobin of 13.6, platelets of 334. Followup  CBC showed a white count of 10.8 with a hemoglobin of 11, platelets of 257.  The decrease of hemoglobin is thought to be dilutional. Potassium was 3.7,  creatinine 0.9. LFTs were normal. Stool cultures showed C Dif. Other  cultures are pending. We also did white blood cells in the stools and they  were negative. Sed rate was 40 which is high, TSH was normal.   HOSPITAL COURSE:  [name] was admitted to the hospital with pancolitis.  She was started on IV fluids and supportive care. She was prescribed Cipro  and Flagyl. Once the culture showed C Dif, Cipro was discontinued. She also  had a great deal of emotional distress. She was continued with Zoloft and  placed on ativan and this  subsided during this admission. On further  questioning, she did have antibiotics in the recent past for a UTI. Her  father who is now deceased was diagnosed with C Diff. a few weeks ago. At  the time of discharge, she is doing much better but she will be discharged  home with the following instructions:   1.  Continue with Zoloft and Synthroid as before.  2.  Flagyl 500 mg 1 p.o. b.i.d. for the next 7 days to complete 10 day      treatment.  3.  Call your primary care doctor if the symptoms resurface.  4.  Do soft diet including soup, crackers and ginger ale for a few days.      Wanda Plump, MD [facility]  Electronically Signed     JEP/MEDQ  D:  07/16/2005  T:  07/17/2005  Job:  [id number]   cc:   Georgina Quint. Plotnikov, M.D. [facility]  [address]  [place]  [place] [address]

## 2010-12-30 NOTE — H&P (Signed)
[name], Hayes NO.:  [id number]   MEDICAL RECORD NO.:  [id number]          PATIENT TYPE:  EMS   LOCATION:  ED                           FACILITY:  [facility]   PHYSICIAN:  Linda Hayes, M.D. LHCDATE OF BIRTH:  08-10-1951   DATE OF ADMISSION:  07/13/2005  DATE OF DISCHARGE:                                HISTORY & PHYSICAL   CHIEF COMPLAINT:  Severe diarrhea, abdominal pain, vomiting.   HISTORY OF PRESENT ILLNESS:  The patient is a 60 year old female who got  acutely sick on Monday/Tuesday this week with nausea, vomiting x2, and  frequent stools with mucus, abdominal pain, chills, fever, occasional stool  incontinence, nonstop for 3 days. She has been getting progressively worse  with weakness, unable to keep any of her medicines down. She was seen today  by Dr. Brunilda Payor for recurrent urinary tract infection and had a CAT scan done  which revealed pancolitis. Dr. Brunilda Payor called me and asked me to see the  patient.   ALLERGIES:  PENICILLIN and ERYTHROMYCIN.   CURRENT MEDICINES:  Ripley Fraise for a bladder infection by Dr. Aldona Bar for a  week or so.  She is currently on Zoloft 150 mg daily and Synthroid 150 mg  daily; however, unable to keep them down over the past couple of days.   PAST MEDICAL HISTORY:  Depression.   FAMILY HISTORY:  Positive for diabetes and coronary disease.   REVIEW OF SYSTEMS:  Recurrent urinary tract infections lately. No previous  problems with diarrhea or inflammatory bowel disease. Has been extremely  upset lately about the death of her father and the suicide of her nephew a  week ago. Has been having nervous breakdowns. No chest pain, no syncope. The  rest is negative.   PHYSICAL EXAMINATION:  VITAL SIGNS:  Temperature 101.2, heart rate 110,  respirations 18, blood pressure not done, weight 177 pounds.  GENERAL:  She is in mild to moderate acute distress. She is extremely  tearful and upset, appears to be in emotional distress.  HEENT:  With dry oral mucosa.  NECK:  Supple, no meningeal signs.  LUNGS:  Clear, no wheezes or rales.  HEART:  S1, S2, tachycardic.  ABDOMEN:  Soft, sensitive throughout. No rebound symptoms, no masses felt.  LOWER EXTREMITIES:  Without edema.  NEUROLOGIC:  She is alert, oriented, cooperative, very depressed and  tearful.   LABORATORY DATA:  CT scan of the abdomen with pancolitis per Dr. Brunilda Payor. I was  not able to open the CD with my computer.   ASSESSMENT AND PLAN:  1.  Severe diarrhea with pancolitis on CAT scan. Etiology unclear. Obtain C.      difficile, clear liquids, start Flagyl. May need a GI consult.  2.  Pancolitis. Plan as above.  3.  Dehydration. Will treat with IV fluids.  4.  Emotional breakdown/severe grief reaction. Will restart Zoloft, give      Ativan IV.  5.  Abdominal pain due to problem #1. Will use morphine as needed.  6.  Frequent recent urinary tract infections. Under the care of Dr. Brunilda Payor.  ______________________________  Linda Quint Hayes, M.D. [facility]     AVP/MEDQ  D:  07/13/2005  T:  07/13/2005  Job:  [id number]   cc:   Linda Hayes, M.D.  Fax: [phone]   Gerrit Friends. Aldona Bar, M.D.  Fax: [phone]

## 2010-12-30 NOTE — Op Note (Signed)
[place]  Patient:    Linda Hayes, Linda Hayes                       MRN: [id number] Proc. Date: 03/05/00 Adm. Date:  [id number] Attending:  Gennie Alma Dictator:   [phone] CC:         Bernadene Person, M.D.             Amil Amen L. Lenon Ahmadi, M.D.                           Operative Report  CCS# [id number]  PREOPERATIVE DIAGNOSIS:  Dominant cold nodule of left lobe of thyroid.  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  OPERATION PERFORMED:  Total thyroidectomy.  SURGEON:  Milus Mallick, M.D.  ASSISTANT:  Dr. Chevis Pretty.  ANESTHESIA:  General endotracheal.  INDICATIONS FOR PROCEDURE:  Under adequate general endotracheal anesthesia, the patients neck was extended over a shoulder roll.  The neck was prepared and draped in the usual fashion.  The low collar incision was made and carried down through the platysma muscle.  Superior and inferior platysmal flaps were developed using a Bovie electrocoagulation.  The Mahorner self-retaining retractor was inserted.  The middle cervical fascia was exposed and midline was incised longitudinally.  The left-sided strap muscles were retracted off of the left thyroid lobe and the surgical plane of the thyroid was entered. By blunt and sharp dissection, the lobe was freed up.  A middle thyroid vein was divided over quadruple clip technique.  This gave more mobility to the left lobe which was able to be retracted medially and rotated anteriorly. Almost immediately we saw the left inferior parathyroid gland and it was totally within the capsule of the thyroid.  It was excised and placed in saline pending pathologic study of the thyroid nodule.  There was a very large thyroid nodule in the lower pole of the thyroid gland that was approximately 3 to 3.5 cm in diameter.  It was quite soft, however.  Next, superior pole vessels were approached.  The sternothyroid  muscle was dissected off of the superior pole of the thyroid using Bovie electrocoagulation.  The superior pole vessels were then controlled with ligature and clip on the stay side and ligature on the specimen side. Ligatures were 2-0 silk.  This gave great mobility to the left thyroid lobe. The recurrent laryngeal nerve was identified and spared of any injury.  The branches of the inferior thyroid artery were divided over small hemoclips. The attachments of the lobe to the thyroid gland were divided over small hemoclips and also with Bovie electrocoagulation until the isthmus was dissected off the anterior wall of the trachea.  The isthmus was divided using a hemostat.  The hemostat was controlled with horizontal mattress suture of 4-0 Vicryl.  Specimen was sent for frozen section study and was found to be most likely follicular variant of papillary carcinoma.  We then proceeded to do a right thyroid lobectomy as well.  The strap muscles were dissected off of the right thyroid lobe and the thyroid was freed up from the surrounding tissue.  The middle thyroid vein was divided over triple hemoclip technique giving more mobility to the lobe.  The right inferior parathyroid gland was also within the capsule of the thyroid gland and was devascularized as the lobe was dissected up.  It was  saved in iced saline to be autotransplanted at the end of the procedure.  The superior pole vessles were handled similarly on the right side as on the left side.  This freed up the superior pole and gave great mobility to the gland.  The superior parathyroid gland was also identified and it also was intimately involved with the superior pole of the thyroid and some of the thyroid tissue was left adjacent to it so as to protect its blood supply.  The right recurrent laryngeal nerve was identified and spared of any injury.  The anterior thyroid artery branches were divided over small Hemoclips close to the  gland.  The gland was then dissected off of the trachea using electrocoagulation and in some instances, Hemoclips.  The specimen was removed from the operative field.  Hemostasis was ascertained.  Small bleeders around the left recurrent laryngeal nerve were clipped with small Hemoclips.  Some Surgicel was placed adjacent to the recurrent laryngeal nerve on the left. Both the paratracheal fossae were irrigated with sterile saline solution until clear.  The strap muscles were reapproximated in the midline with continuous suture of 3-0 Vicryl.  The right anterior parathyroid gland was then inserted into the right sternocleidomastoid muscle after mincing it into approximately four or five pieces.  The muscle was closed with a figure-of-eight of 4-0 Vicryl.  The platysma layer was reapproximated with interrupted sutures of 4-0 Vicryl and the skin was approximated with a generic skin stapler 35-W.  A sterile dressing was applied.  Estimated blood loss for this procedure was approximately 100 cc.  The patient tolerated the procedure well and left the operating room in satisfactory condition.  DESCRIPTION OF PROCEDURE: DD:  03/05/00 TD:  03/06/00 Job: [id number]

## 2010-12-30 NOTE — Op Note (Signed)
NAMEKHADEJA, ABT                          ACCOUNT NO.:  [id number]   MEDICAL RECORD NO.:  [id number]                   PATIENT TYPE:  AMB   LOCATION:  DSC                                  FACILITY:  [facility]   PHYSICIAN:  Earvin Hansen L. Shon Hough, M.D.           DATE OF BIRTH:  March 25, 1951   DATE OF PROCEDURE:  06/22/2003  DATE OF DISCHARGE:                                 OPERATIVE REPORT   OPERATION PERFORMED:  Bilateral breast reductions using the inferior pedicle  technique.   SURGEON:  Yaakov Guthrie. Shon Hough, M.D.   ASSISTANT:  Alethia Berthold, CFA-PAO   ANESTHESIA:  General.   INDICATIONS FOR PROCEDURE:  The patient is a 61 year old lady who had severe  macromastia, back and shoulder pain secondary to large pendulous breasts,  has intertrigo, pitting of both shoulders, has used lotions and other types  of treatments for the intertriginous changes under her breast tissue.  She  wears special bras and has had increased pain secondary to the pitting of  both shoulders.   DESCRIPTION OF PROCEDURE:  The patient was taken to the operating room.  In  the sitting position, she had drawings done for the inferior pedicle  reduction mammoplasty, re-marking nipple areolar complexes back up to 20 cm  from the suprasternal notch.  The patient demonstrated increased asymmetry  and severe macromastia with the right breast larger than the left.  She also  has accessory breast tissue in the right and left axillary and latissimus  dorsi regions.  She then underwent general anesthesia, intubated orally.  Prep was done to the chest, breast areas in the routine fashion and abdomen  using Hibiclens solution, walled off with sterile towels and drapes so as to  make as sterile field.  0.25% Xylocaine with epinephrine was injected  locally, per side 1:400,000 concentration.  This was allowed to set up  and then the wounds were then scored with #15 blades.  The skin over the  inferior pedicle was  de-epithelialized with #20 blades.  After this, the  medial and lateral fatty dermal pedicles were excised down to underlying  fascia. Again, hemostasis was maintained with the Bovie unit on coagulation.  Next, a new key hole area was debulked and out more laterally, more  accessory breast tissue was removed.  After proper hemostasis the flaps were  transposed and stayed with 3-0 Prolene.  Subcutaneous closure was done with  3-0 Monocryl times two levels and then a running subcuticular stitch of 3-0  Monocryl and 5-0 Monocryl throughout the inverted T. The wounds were drained  with a #10 Blake drain fully fluted, placed in the depths of the wound and  brought out through the lateralmost portion of the incision secured with 3-0  Prolene.  The same procedure was carried out on both sides removing over  100g on the left and over another 100g on the right.  The wounds were  cleansed, Steri-Strips and soft dressings were applied to all the areas  including 4 x 4's ABD's, HypaFix and Xeroform gauze.  She withstood the  procedures very well, left the operating room in good condition, nipple  areolar complexes supple, with excellent blood supply.                                               Yaakov Guthrie. Shon Hough, M.D.    Cathie Hoops  D:  06/22/2003  T:  06/22/2003  Job:  [id number]

## 2011-02-14 ENCOUNTER — Other Ambulatory Visit: Payer: Self-pay | Admitting: Obstetrics & Gynecology

## 2011-03-28 ENCOUNTER — Ambulatory Visit: Payer: BC Managed Care – PPO | Admitting: Internal Medicine

## 2011-05-01 ENCOUNTER — Telehealth: Payer: Self-pay | Admitting: *Deleted

## 2011-05-01 ENCOUNTER — Other Ambulatory Visit (INDEPENDENT_AMBULATORY_CARE_PROVIDER_SITE_OTHER): Payer: BC Managed Care – PPO

## 2011-05-01 ENCOUNTER — Encounter: Payer: Self-pay | Admitting: Internal Medicine

## 2011-05-01 ENCOUNTER — Ambulatory Visit (INDEPENDENT_AMBULATORY_CARE_PROVIDER_SITE_OTHER): Payer: BC Managed Care – PPO | Admitting: Internal Medicine

## 2011-05-01 VITALS — BP 122/76 | HR 74 | Temp 98.4°F | Wt 195.0 lb

## 2011-05-01 DIAGNOSIS — F411 Generalized anxiety disorder: Secondary | ICD-10-CM

## 2011-05-01 DIAGNOSIS — E039 Hypothyroidism, unspecified: Secondary | ICD-10-CM

## 2011-05-01 DIAGNOSIS — J019 Acute sinusitis, unspecified: Secondary | ICD-10-CM

## 2011-05-01 MED ORDER — LEVOFLOXACIN 500 MG PO TABS: 500.0000 mg | ORAL_TABLET | Freq: Every day | ORAL | Status: AC

## 2011-05-01 NOTE — Patient Instructions (Signed)
Take all new medications as prescribed Continue all other medications as before  

## 2011-05-01 NOTE — Assessment & Plan Note (Addendum)
stable overall by hx and exam but c/w hyperthyroid,thyroid med reduced feb 2012, most recent data reviewed with pt, and pt to continue medical treatment as before, f/u as planned with repeat tsh  Lab Results  Component Value Date   TSH 0.04* 09/23/2010

## 2011-05-01 NOTE — Assessment & Plan Note (Signed)
stable overall by hx and exam, most recent data reviewed with pt, and pt to continue medical treatment as before  Lab Results  Component Value Date   WBC 6.5 09/23/2010   HGB 14.1 09/23/2010   HCT 40.1 09/23/2010   PLT 291.0 09/23/2010   CHOL 219* 09/23/2010   TRIG 203.0* 09/23/2010   HDL 36.60* 09/23/2010   LDLDIRECT 147.8 09/23/2010   ALT 17 09/23/2010   AST 20 09/23/2010   NA 142 09/23/2010   K 4.4 09/23/2010   CL 106 09/23/2010   CREATININE 0.8 09/23/2010   BUN 12 09/23/2010   CO2 30 09/23/2010   TSH 0.04* 09/23/2010

## 2011-05-01 NOTE — Progress Notes (Signed)
  Subjective:    Patient ID: Linda Hayes, female    DOB: 03-21-1951, 60 y.o.   MRN: [id number]  HPI   Here with 3 days acute onset fever, facial pain, pressure, general weakness and malaise, and greenish d/c, with slight ST, but little to no cough and Pt denies chest pain, increased sob or doe, wheezing, orthopnea, PND, increased LE swelling, palpitations, dizziness or syncope. May have had some increased allergy symtpoms in the past 2 wks, but not clear as she has not been bothered in the past.  Pt denies chest pain, increased sob or doe, wheezing, orthopnea, PND, increased LE swelling, palpitations, dizziness or syncope.  Pt denies new neurological symptoms such as new headache, or facial or extremity weakness or numbness   Pt denies polydipsia, polyuria. Denies hyper or hypo thyroid symptoms such as voice, skin or hair change.  Past Medical History  Diagnosis Date  . ANXIETY 03/17/2008  . ASTHMATIC BRONCHITIS, ACUTE 10/01/2008  . 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 DEPRESSION 03/17/2008  . GAIT ATAXIA 06/11/2009  . HYPOTHYROIDISM 03/17/2008  . INSOMNIA, PERSISTENT 03/17/2008  . OBESITY 03/17/2008  . Palpitations 10/01/2008  . SWEATING 03/17/2008   Past Surgical History  Procedure Date  . Thyroidectomy 1999    reports that she has never smoked. She does not have any smokeless tobacco history on file. Her alcohol and drug histories not on file. family history includes Depression in her other; Diabetes in her other; and Hypertension in her other. Allergies  Allergen Reactions  . Penicillins    No current outpatient prescriptions on file prior to visit.   Review of Systems Review of Systems  Constitutional: Negative for diaphoresis and unexpected weight change.  HENT: Negative for drooling and tinnitus.   Eyes: Negative for photophobia and visual disturbance.  Respiratory: Negative for choking and stridor.   Gastrointestinal: Negative for vomiting and blood in stool.  Genitourinary: Negative for  hematuria and decreased urine volume.     Objective:   Physical Exam BP 122/76  Pulse 74  Temp(Src) 98.4 F (36.9 C) (Oral)  Wt 195 lb (88.451 kg)  SpO2 97% Physical Exam  VS noted, mild ill appearing Constitutional: Pt appears well-developed and well-nourished.  HENT: Head: Normocephalic.  Right Ear: External ear normal.  Left Ear: External ear normal.  Bilat tm's mild erythema.  Sinus tender bilat.  Pharynx mild erythema Eyes: Conjunctivae and EOM are normal. Pupils are equal, round, and reactive to light.  Neck: Normal range of motion. Neck supple.  Cardiovascular: Normal rate and regular rhythm.   Pulmonary/Chest: Effort normal and breath sounds normal.  Neurological: Pt is alert. No cranial nerve deficit.  Skin: Skin is warm. No erythema.  Psychiatric: Pt behavior is normal. Thought content normal.         Assessment & Plan:

## 2011-05-01 NOTE — Telephone Encounter (Signed)
Fu labs

## 2011-05-08 ENCOUNTER — Ambulatory Visit: Payer: BC Managed Care – PPO | Admitting: Internal Medicine

## 2011-10-17 ENCOUNTER — Other Ambulatory Visit: Payer: Self-pay | Admitting: Internal Medicine

## 2012-04-25 ENCOUNTER — Other Ambulatory Visit: Payer: Self-pay | Admitting: Internal Medicine

## 2012-05-24 ENCOUNTER — Encounter: Payer: Self-pay | Admitting: Internal Medicine

## 2012-05-24 ENCOUNTER — Other Ambulatory Visit: Payer: Self-pay | Admitting: Internal Medicine

## 2012-05-24 ENCOUNTER — Ambulatory Visit (INDEPENDENT_AMBULATORY_CARE_PROVIDER_SITE_OTHER): Payer: BC Managed Care – PPO | Admitting: Internal Medicine

## 2012-05-24 VITALS — BP 124/90 | HR 72 | Temp 97.2°F | Resp 16 | Wt 196.0 lb

## 2012-05-24 DIAGNOSIS — B029 Zoster without complications: Secondary | ICD-10-CM

## 2012-05-24 DIAGNOSIS — E039 Hypothyroidism, unspecified: Secondary | ICD-10-CM

## 2012-05-24 DIAGNOSIS — R21 Rash and other nonspecific skin eruption: Secondary | ICD-10-CM

## 2012-05-24 DIAGNOSIS — M545 Low back pain, unspecified: Secondary | ICD-10-CM

## 2012-05-24 MED ORDER — ACYCLOVIR 800 MG PO TABS: 800.0000 mg | ORAL_TABLET | Freq: Every day | ORAL | Status: DC

## 2012-05-24 MED ORDER — TRIAMCINOLONE ACETONIDE 0.5 % EX CREA: TOPICAL_CREAM | Freq: Three times a day (TID) | CUTANEOUS | Status: DC

## 2012-05-24 MED ORDER — GABAPENTIN 100 MG PO CAPS: 100.0000 mg | ORAL_CAPSULE | Freq: Three times a day (TID) | ORAL | Status: DC | PRN

## 2012-05-24 MED ORDER — IBUPROFEN 600 MG PO TABS: 600.0000 mg | ORAL_TABLET | Freq: Three times a day (TID) | ORAL | Status: DC | PRN

## 2012-05-24 NOTE — Progress Notes (Signed)
Subjective:    Patient ID: Linda Hayes, female    DOB: Aug 01, 1951, 61 y.o.   MRN: [id number]  Rash This is a new problem. The current episode started in the past 7 days. The problem has been gradually worsening since onset. The affected locations include the back (L LS spine). The rash is characterized by blistering, burning and itchiness. She was exposed to nothing. Pertinent negatives include no congestion, cough, diarrhea, eye pain, fatigue, fever, rhinorrhea, shortness of breath or sore throat. The treatment provided no relief. There is no history of eczema.       Past Medical History  Diagnosis Date  . ANXIETY 03/17/2008  . ASTHMATIC BRONCHITIS, ACUTE 10/01/2008  . 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 DEPRESSION 03/17/2008  . GAIT ATAXIA 06/11/2009  . HYPOTHYROIDISM 03/17/2008  . INSOMNIA, PERSISTENT 03/17/2008  . OBESITY 03/17/2008  . Palpitations 10/01/2008  . SWEATING 03/17/2008   Past Surgical History  Procedure Date  . Thyroidectomy 1999    reports that she has never smoked. She does not have any smokeless tobacco history on file. Her alcohol and drug histories not on file. family history includes Depression in her other; Diabetes in her other; and Hypertension in her other. Allergies  Allergen Reactions  . Penicillins    Current Outpatient Prescriptions on File Prior to Visit  Medication Sig Dispense Refill  . ALPRAZolam (XANAX) 0.5 MG tablet 1/2 tablet once daily prn       . cholecalciferol (VITAMIN D) 1000 UNITS tablet Take 1,000 Units by mouth daily.        . sennosides-docusate sodium (SENOKOT-S) 8.6-50 MG tablet Take 1 tablet by mouth daily.        . sertraline (ZOLOFT) 100 MG tablet Take 100 mg by mouth daily.        [name] zolpidem (AMBIEN) 10 MG tablet 1/2 - 1 tablet at bedtime as needed for insomnia       . DISCONTD: SYNTHROID 100 MCG tablet TAKE 1 TABLET BY MOUTH EVERY DAY  30 tablet  0  . diphenhydramine-acetaminophen (TYLENOL PM) 25-500 MG TABS Take 1 tablet by mouth at bedtime  as needed.        . polyethylene glycol powder (GLYCOLAX/MIRALAX) powder Take 17 g by mouth daily.         Review of Systems  Constitutional: Negative.  Negative for fever, chills, diaphoresis, activity change, appetite change, fatigue and unexpected weight change.  HENT: Negative for hearing loss, ear pain, nosebleeds, congestion, sore throat, facial swelling, rhinorrhea, sneezing, mouth sores, trouble swallowing, neck pain, neck stiffness, postnasal drip, sinus pressure and tinnitus.   Eyes: Negative for pain, discharge, redness, itching and visual disturbance.  Respiratory: Negative for cough, chest tightness, shortness of breath, wheezing and stridor.   Cardiovascular: Negative for chest pain, palpitations and leg swelling.  Gastrointestinal: Negative for nausea, diarrhea, constipation, blood in stool, abdominal distention, anal bleeding and rectal pain.  Genitourinary: Negative for dysuria, urgency, frequency, hematuria, flank pain, vaginal bleeding, vaginal discharge, difficulty urinating, genital sores and pelvic pain.  Musculoskeletal: Positive for back pain. Negative for joint swelling, arthralgias and gait problem.  Skin: Positive for rash.  Neurological: Negative for dizziness, tremors, seizures, syncope, speech difficulty, weakness, numbness and headaches.  Hematological: Negative for adenopathy. Does not bruise/bleed easily.  Psychiatric/Behavioral: Negative for suicidal ideas, behavioral problems, disturbed wake/sleep cycle, dysphoric mood and decreased concentration. The patient is not nervous/anxious.        Objective:   Physical Exam  Constitutional: She appears well-developed. No  distress.  HENT:  Head: Normocephalic.  Right Ear: External ear normal.  Left Ear: External ear normal.  Nose: Nose normal.  Mouth/Throat: Oropharynx is clear and moist.  Eyes: Conjunctivae normal are normal. Pupils are equal, round, and reactive to light. Right eye exhibits no discharge. Left  eye exhibits no discharge.  Neck: Normal range of motion. Neck supple. No JVD present. No tracheal deviation present. No thyromegaly present.  Cardiovascular: Normal rate, regular rhythm and normal heart sounds.   Pulmonary/Chest: No stridor. No respiratory distress. She has no wheezes.  Abdominal: Soft. Bowel sounds are normal. She exhibits no distension and no mass. There is no tenderness. There is no rebound and no guarding.  Musculoskeletal: She exhibits no edema and no tenderness.  Lymphadenopathy:    She has no cervical adenopathy.  Neurological: She displays normal reflexes. No cranial nerve deficit. She exhibits normal muscle tone. Coordination normal.  Skin: Rash (R LS spine with  a large area of erythema with vesicles) noted. No erythema.  Psychiatric: She has a normal mood and affect. Her behavior is normal. Judgment and thought content normal.   BP 124/90  Pulse 72  Temp 97.2 F (36.2 C) (Oral)  Resp 16  Wt 196 lb (88.905 kg)         Assessment & Plan:

## 2012-05-26 ENCOUNTER — Encounter: Payer: Self-pay | Admitting: Internal Medicine

## 2012-05-27 DIAGNOSIS — B029 Zoster without complications: Secondary | ICD-10-CM | POA: Insufficient documentation

## 2012-05-27 DIAGNOSIS — R21 Rash and other nonspecific skin eruption: Secondary | ICD-10-CM | POA: Insufficient documentation

## 2012-05-27 DIAGNOSIS — M545 Low back pain, unspecified: Secondary | ICD-10-CM | POA: Insufficient documentation

## 2012-05-27 NOTE — Assessment & Plan Note (Signed)
See meds 

## 2012-05-27 NOTE — Assessment & Plan Note (Signed)
Gabapentin Ibuprofen

## 2012-05-27 NOTE — Assessment & Plan Note (Signed)
Acyclovir x 10 d 

## 2012-06-18 ENCOUNTER — Other Ambulatory Visit (INDEPENDENT_AMBULATORY_CARE_PROVIDER_SITE_OTHER): Payer: BC Managed Care – PPO

## 2012-06-18 DIAGNOSIS — E039 Hypothyroidism, unspecified: Secondary | ICD-10-CM

## 2012-06-18 LAB — TSH: TSH: 2.18 u[IU]/mL (ref 0.35–5.50)

## 2012-06-18 LAB — BASIC METABOLIC PANEL
BUN: 17 mg/dL (ref 6–23)
Calcium: 9.1 mg/dL (ref 8.4–10.5)
Creatinine, Ser: 0.8 mg/dL (ref 0.4–1.2)

## 2012-07-05 ENCOUNTER — Ambulatory Visit (INDEPENDENT_AMBULATORY_CARE_PROVIDER_SITE_OTHER): Payer: BC Managed Care – PPO | Admitting: Internal Medicine

## 2012-07-05 ENCOUNTER — Telehealth: Payer: Self-pay | Admitting: Internal Medicine

## 2012-07-05 ENCOUNTER — Encounter: Payer: Self-pay | Admitting: Internal Medicine

## 2012-07-05 VITALS — BP 124/84 | HR 90 | Temp 97.6°F | Ht 64.0 in | Wt 197.0 lb

## 2012-07-05 DIAGNOSIS — L2089 Other atopic dermatitis: Secondary | ICD-10-CM

## 2012-07-05 DIAGNOSIS — R21 Rash and other nonspecific skin eruption: Secondary | ICD-10-CM

## 2012-07-05 DIAGNOSIS — L209 Atopic dermatitis, unspecified: Secondary | ICD-10-CM

## 2012-07-05 MED ORDER — PREDNISONE 10 MG PO TABS: 10.0000 mg | ORAL_TABLET | Freq: Every day | ORAL | Status: DC

## 2012-07-05 NOTE — Progress Notes (Signed)
Subjective:    Patient ID: Linda Hayes, female    DOB: March 27, 1951, 61 y.o.   MRN: [id number]  HPI  Pt presents to the clinic with c/o rash. The rash is located behind both of her knees. The rash is very itchy, but sore and painful to touch. She has concerns that this may be shingles. She was seen by Dr. Posey Rea a couple of weeks ago for the same type of rash. He gave her triamcinolone cream and Zovirax. The rash went away, and has not had any problems until this new rash developed. She does take hot bathes everyday and feels like the rash is worse after that. She has been applying some of the triamcinolone cream to the area but the rash has not gone away completely. She does not have a history of allergies or asthma.   Review of Systems  Past Medical History  Diagnosis Date  . ANXIETY 03/17/2008  . ASTHMATIC BRONCHITIS, ACUTE 10/01/2008  . 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 DEPRESSION 03/17/2008  . GAIT ATAXIA 06/11/2009  . HYPOTHYROIDISM 03/17/2008  . INSOMNIA, PERSISTENT 03/17/2008  . OBESITY 03/17/2008  . Palpitations 10/01/2008  . SWEATING 03/17/2008    Current Outpatient Prescriptions  Medication Sig Dispense Refill  . acyclovir (ZOVIRAX) 800 MG tablet Take 1 tablet (800 mg total) by mouth 5 (five) times daily.  50 tablet  0  . ALPRAZolam (XANAX) 0.5 MG tablet 1/2 tablet once daily prn       . cholecalciferol (VITAMIN D) 1000 UNITS tablet Take 1,000 Units by mouth daily.        [name] ibuprofen (ADVIL,MOTRIN) 600 MG tablet Take 1 tablet (600 mg total) by mouth every 8 (eight) hours as needed for pain.  60 tablet  0  . sertraline (ZOLOFT) 100 MG tablet Take 100 mg by mouth daily.        [name] SYNTHROID 100 MCG tablet TAKE 1 TABLET BY MOUTH EVERY DAY  30 tablet  5  . triamcinolone cream (KENALOG) 0.5 % Apply topically 3 (three) times daily.  60 g  1  . zolpidem (AMBIEN) 10 MG tablet 1/2 - 1 tablet at bedtime as needed for insomnia       . gabapentin (NEURONTIN) 100 MG capsule Take 1 capsule (100 mg  total) by mouth 3 (three) times daily as needed (pain).  90 capsule  3  . predniSONE (DELTASONE) 10 MG tablet Take 1 tablet (10 mg total) by mouth daily.  10 tablet  0    Allergies  Allergen Reactions  . Penicillins     Family History  Problem Relation Age of Onset  . Hypertension Other   . Depression Other   . Diabetes Other     History   Social History  . Marital Status: Married    Spouse Name: N/A    Number of Children: N/A  . Years of Education: N/A   Occupational History  . Not on file.   Social History Main Topics  . Smoking status: Never Smoker   . Smokeless tobacco: Not on file  . Alcohol Use: Not on file  . Drug Use: Not on file  . Sexually Active: Not on file   Other Topics Concern  . Not on file   Social History Narrative  . No narrative on file     Constitutional: Denies fever, malaise, fatigue, headache or abrupt weight changes.  Skin: Pt reports red rash on the back of both knees. Denies lesions or ulcercations.   No other  specific complaints in a complete review of systems (except as listed in HPI above).     Objective:   Physical Exam  BP 124/84  Pulse 90  Temp 97.6 F (36.4 C) (Oral)  Ht 5\' 4"  (1.626 m)  Wt 197 lb (89.359 kg)  BMI 33.82 kg/m2  SpO2 95% Wt Readings from Last 3 Encounters:  07/05/12 197 lb (89.359 kg)  05/24/12 196 lb (88.905 kg)  05/01/11 195 lb (88.451 kg)    General: Appears her stated age, well developed, well nourished in NAD. Skin:  Erythematous rash with grouped papules noted on bilateral posterior knees. No lesions or ulcerations noted. Cardiovascular: Normal rate and rhythm. S1,S2 noted.  No murmur, rubs or gallops noted. No JVD or BLE edema. No carotid bruits noted. Pulmonary/Chest: Normal effort and positive vesicular breath sounds. No respiratory distress. No wheezes, rales or ronchi noted.       Assessment & Plan:   Atopic Dermatitis, new onset with additional workup required:  Avoid hot baths. Pat  the area dry after bathing. Use moisturizing cream such as Eucerin which can be bought OTC. Continue applying triamcinolone cream to the area, call for refill if needed. Prednisone 10 mg x 10 days.  RTC if symptoms persist or get worse.

## 2012-07-05 NOTE — Telephone Encounter (Signed)
Patient Information:  Caller Name: Ceazia  Phone: [phone]  Patient: Linda Hayes, Linda Hayes  Gender: Female  DOB: 07-04-1951  Age: 61 Years  PCP: Plotnikov, Alex (Adults only)   Symptoms  Reason For Call & Symptoms: shingles?  seen a month ago and took zovirax and cream.  Reviewed Health History In EMR: Yes  Reviewed Medications In EMR: Yes  Reviewed Allergies In EMR: Yes  Date of Onset of Symptoms: 07/02/2012  Guideline(s) Used:  Rash or Redness - Localized  Disposition Per Guideline:   See Today in Office  Reason For Disposition Reached:   Localized rash is very painful (no fever)  Advice Given:  N/A  Office Follow Up:  Does the office need to follow up with this patient?: No  Instructions For The Office: N/A  Appointment Scheduled:  07/05/2012 11:00:00  RN Note:  Appt scheduled 07/05/12 1100 with Ms. Sampson Si, as Dr. Loren Racer schedule is full krs/can

## 2012-07-05 NOTE — Patient Instructions (Addendum)

## 2012-11-12 ENCOUNTER — Ambulatory Visit: Payer: BC Managed Care – PPO | Admitting: Internal Medicine

## 2012-11-12 ENCOUNTER — Ambulatory Visit (INDEPENDENT_AMBULATORY_CARE_PROVIDER_SITE_OTHER): Payer: BC Managed Care – PPO | Admitting: Internal Medicine

## 2012-11-12 ENCOUNTER — Encounter: Payer: Self-pay | Admitting: Internal Medicine

## 2012-11-12 VITALS — BP 100/70 | HR 68 | Temp 98.0°F | Resp 16 | Wt 199.0 lb

## 2012-11-12 DIAGNOSIS — J019 Acute sinusitis, unspecified: Secondary | ICD-10-CM

## 2012-11-12 MED ORDER — DOXYCYCLINE HYCLATE 100 MG PO TABS: 100.0000 mg | ORAL_TABLET | Freq: Two times a day (BID) | ORAL | Status: DC

## 2012-11-12 MED ORDER — METHYLPREDNISOLONE ACETATE 80 MG/ML IJ SUSP
80.0000 mg | Freq: Once | INTRAMUSCULAR | Status: AC
  Administered 2012-11-12: 80 mg via INTRAMUSCULAR

## 2012-11-12 NOTE — Progress Notes (Signed)
Patient ID: Linda Hayes, female   DOB: [birth year], 62 y.o.   MRN: [id number]   Subjective:    Patient ID: Linda Hayes, female    DOB: May 11, 1951, 62 y.o.   MRN: [id number]  Sinusitis This is a new problem. The current episode started 1 to 4 weeks ago. The problem has been gradually worsening since onset. There has been no fever. The pain is mild. Associated symptoms include ear pain. Pertinent negatives include no chills, diaphoresis, headaches, neck pain, sinus pressure or sneezing. Past treatments include oral decongestants.  Otalgia  There is pain in the right ear. The current episode started 1 to 4 weeks ago. Pertinent negatives include no headaches, hearing loss or neck pain. There is no history of a chronic ear infection.       Past Medical History  Diagnosis Date  . ANXIETY 03/17/2008  . ASTHMATIC BRONCHITIS, ACUTE 10/01/2008  . 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 DEPRESSION 03/17/2008  . GAIT ATAXIA 06/11/2009  . HYPOTHYROIDISM 03/17/2008  . INSOMNIA, PERSISTENT 03/17/2008  . OBESITY 03/17/2008  . Palpitations 10/01/2008  . SWEATING 03/17/2008   Past Surgical History  Procedure Laterality Date  . Thyroidectomy  1999    reports that she has never smoked. She does not have any smokeless tobacco history on file. Her alcohol and drug histories are not on file. family history includes Depression in her other; Diabetes in her other; and Hypertension in her other. Allergies  Allergen Reactions  . Penicillins    Current Outpatient Prescriptions on File Prior to Visit  Medication Sig Dispense Refill  . ALPRAZolam (XANAX) 0.5 MG tablet 1/2 tablet once daily prn       . cholecalciferol (VITAMIN D) 1000 UNITS tablet Take 1,000 Units by mouth daily.        [name] ibuprofen (ADVIL,MOTRIN) 600 MG tablet Take 1 tablet (600 mg total) by mouth every 8 (eight) hours as needed for pain.  60 tablet  0  . predniSONE (DELTASONE) 10 MG tablet Take 1 tablet (10 mg total) by mouth daily.  10 tablet  0  . sertraline  (ZOLOFT) 100 MG tablet Take 100 mg by mouth daily.        [name] SYNTHROID 100 MCG tablet TAKE 1 TABLET BY MOUTH EVERY DAY  30 tablet  5  . triamcinolone cream (KENALOG) 0.5 % Apply topically 3 (three) times daily.  60 g  1  . zolpidem (AMBIEN) 10 MG tablet 1/2 - 1 tablet at bedtime as needed for insomnia       . acyclovir (ZOVIRAX) 800 MG tablet Take 1 tablet (800 mg total) by mouth 5 (five) times daily.  50 tablet  0  . gabapentin (NEURONTIN) 100 MG capsule Take 1 capsule (100 mg total) by mouth 3 (three) times daily as needed (pain).  90 capsule  3   No current facility-administered medications on file prior to visit.   Review of Systems  Constitutional: Negative.  Negative for chills, diaphoresis, activity change, appetite change and unexpected weight change.  HENT: Positive for ear pain. Negative for hearing loss, nosebleeds, facial swelling, sneezing, mouth sores, trouble swallowing, neck pain, neck stiffness, postnasal drip, sinus pressure and tinnitus.   Eyes: Negative for discharge, redness, itching and visual disturbance.  Respiratory: Negative for chest tightness, wheezing and stridor.   Cardiovascular: Negative for chest pain, palpitations and leg swelling.  Gastrointestinal: Negative for nausea, constipation, blood in stool, abdominal distention, anal bleeding and rectal pain.  Genitourinary: Negative for dysuria, urgency, frequency, hematuria,  flank pain, vaginal bleeding, vaginal discharge, difficulty urinating, genital sores and pelvic pain.  Musculoskeletal: Positive for back pain. Negative for joint swelling, arthralgias and gait problem.  Neurological: Negative for dizziness, tremors, seizures, syncope, speech difficulty, weakness, numbness and headaches.  Hematological: Negative for adenopathy. Does not bruise/bleed easily.  Psychiatric/Behavioral: Negative for suicidal ideas, behavioral problems, sleep disturbance, dysphoric mood and decreased concentration. The patient is not  nervous/anxious.        Objective:   Physical Exam  Constitutional: She appears well-developed. No distress.  HENT:  Head: Normocephalic.  Right Ear: External ear normal.  Left Ear: External ear normal.  Nose: Nose normal.  Mouth/Throat: Oropharynx is clear and moist.  eryth nasal mucosa L upper eyelid is droopy  Eyes: Conjunctivae are normal. Pupils are equal, round, and reactive to light. Right eye exhibits no discharge. Left eye exhibits no discharge.  Neck: Normal range of motion. Neck supple. No JVD present. No tracheal deviation present. No thyromegaly present.  Cardiovascular: Normal rate, regular rhythm and normal heart sounds.   Pulmonary/Chest: No stridor. No respiratory distress. She has no wheezes.  Abdominal: Soft. Bowel sounds are normal. She exhibits no distension and no mass. There is no tenderness. There is no rebound and no guarding.  Musculoskeletal: She exhibits no edema and no tenderness.  Lymphadenopathy:    She has no cervical adenopathy.  Neurological: She displays normal reflexes. No cranial nerve deficit. She exhibits normal muscle tone. Coordination normal.  blepharospasms  Skin: Rash (R LS spine with  a large area of erythema with vesicles) noted. No erythema.  Psychiatric: She has a normal mood and affect. Her behavior is normal. Judgment and thought content normal.   BP 100/70  Pulse 68  Temp(Src) 98 F (36.7 C) (Oral)  Resp 16  Wt 199 lb (90.266 kg)  BMI 34.14 kg/m2         Assessment & Plan:

## 2012-11-12 NOTE — Assessment & Plan Note (Addendum)
Doxy x 10 d Depo 80 mg im

## 2012-11-12 NOTE — Patient Instructions (Addendum)

## 2012-11-12 NOTE — Addendum Note (Signed)
Addended by: Merrilyn Puma on: 11/12/2012 03:18 PM   Modules accepted: Orders

## 2012-11-15 ENCOUNTER — Other Ambulatory Visit: Payer: Self-pay | Admitting: Internal Medicine

## 2013-03-08 ENCOUNTER — Emergency Department: Payer: Self-pay | Admitting: Emergency Medicine

## 2013-03-08 LAB — URINALYSIS, COMPLETE
Ketone: NEGATIVE
Leukocyte Esterase: NEGATIVE
Nitrite: NEGATIVE
Ph: 5 (ref 4.5–8.0)
Protein: NEGATIVE
Specific Gravity: 1.015 (ref 1.003–1.030)
WBC UR: 1 /HPF (ref 0–5)

## 2013-03-08 LAB — COMPREHENSIVE METABOLIC PANEL
BUN: 13 mg/dL (ref 7–18)
Bilirubin,Total: 0.7 mg/dL (ref 0.2–1.0)
Calcium, Total: 7.6 mg/dL — ABNORMAL LOW (ref 8.5–10.1)
Chloride: 112 mmol/L — ABNORMAL HIGH (ref 98–107)
Creatinine: 0.64 mg/dL (ref 0.60–1.30)
Glucose: 104 mg/dL — ABNORMAL HIGH (ref 65–99)
Potassium: 3.4 mmol/L — ABNORMAL LOW (ref 3.5–5.1)
SGOT(AST): 30 U/L (ref 15–37)

## 2013-03-08 LAB — LIPASE, BLOOD: Lipase: 67 U/L — ABNORMAL LOW (ref 73–393)

## 2013-03-08 LAB — CBC
MCH: 30.6 pg (ref 26.0–34.0)
MCHC: 31.9 g/dL — ABNORMAL LOW (ref 32.0–36.0)
Platelet: 186 10*3/uL (ref 150–440)

## 2013-03-13 ENCOUNTER — Encounter: Payer: Self-pay | Admitting: Internal Medicine

## 2013-03-13 ENCOUNTER — Ambulatory Visit (INDEPENDENT_AMBULATORY_CARE_PROVIDER_SITE_OTHER): Payer: BC Managed Care – PPO | Admitting: Internal Medicine

## 2013-03-13 VITALS — BP 110/76 | HR 72 | Temp 98.1°F | Resp 16 | Wt 200.0 lb

## 2013-03-13 DIAGNOSIS — K529 Noninfective gastroenteritis and colitis, unspecified: Secondary | ICD-10-CM

## 2013-03-13 DIAGNOSIS — E039 Hypothyroidism, unspecified: Secondary | ICD-10-CM

## 2013-03-13 DIAGNOSIS — K5289 Other specified noninfective gastroenteritis and colitis: Secondary | ICD-10-CM

## 2013-03-13 DIAGNOSIS — D649 Anemia, unspecified: Secondary | ICD-10-CM | POA: Insufficient documentation

## 2013-03-13 DIAGNOSIS — G245 Blepharospasm: Secondary | ICD-10-CM

## 2013-03-13 DIAGNOSIS — F411 Generalized anxiety disorder: Secondary | ICD-10-CM

## 2013-03-13 MED ORDER — ONDANSETRON HCL 4 MG PO TABS: 4.0000 mg | ORAL_TABLET | Freq: Three times a day (TID) | ORAL | Status: DC | PRN

## 2013-03-13 NOTE — Assessment & Plan Note (Signed)
7/14 - ?viral 

## 2013-03-13 NOTE — Patient Instructions (Signed)
Nexium 1 a day 

## 2013-03-13 NOTE — Assessment & Plan Note (Signed)
Continue with current prescription therapy as reflected on the Med list.  

## 2013-03-13 NOTE — Progress Notes (Signed)
Subjective:    HPI     F/u ER visit at [facility] last Sat with n/v/d after eating at [facility]. Husband was sick too. C/o weakness and diarrhea twice a day. Imodium helped  C/o anemia Hgb 10.0 - she is a blood donor. Last colon 2007  Past Medical History  Diagnosis Date  . ANXIETY 03/17/2008  . ASTHMATIC BRONCHITIS, ACUTE 10/01/2008  . 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 DEPRESSION 03/17/2008  . GAIT ATAXIA 06/11/2009  . HYPOTHYROIDISM 03/17/2008  . INSOMNIA, PERSISTENT 03/17/2008  . OBESITY 03/17/2008  . Palpitations 10/01/2008  . SWEATING 03/17/2008   Past Surgical History  Procedure Laterality Date  . Thyroidectomy  1999    reports that she has never smoked. She does not have any smokeless tobacco history on file. Her alcohol and drug histories are not on file. family history includes Depression in her other; Diabetes in her other; and Hypertension in her other. Allergies  Allergen Reactions  . Erythromycin     Intolerance - GI  . Penicillins    Current Outpatient Prescriptions on File Prior to Visit  Medication Sig Dispense Refill  . ALPRAZolam (XANAX) 0.5 MG tablet 1/2 tablet once daily prn       . cholecalciferol (VITAMIN D) 1000 UNITS tablet Take 1,000 Units by mouth daily.        [name] ibuprofen (ADVIL,MOTRIN) 600 MG tablet Take 1 tablet (600 mg total) by mouth every 8 (eight) hours as needed for pain.  60 tablet  0  . sertraline (ZOLOFT) 100 MG tablet Take 100 mg by mouth daily.        [name] SYNTHROID 100 MCG tablet TAKE 1 TABLET BY MOUTH EVERY DAY  30 tablet  5  . SYNTHROID 100 MCG tablet TAKE 1 TABLET BY MOUTH EVERY DAY  30 tablet  5  . triamcinolone cream (KENALOG) 0.5 % Apply topically 3 (three) times daily.  60 g  1  . zolpidem (AMBIEN) 10 MG tablet 1/2 - 1 tablet at bedtime as needed for insomnia       . doxycycline (VIBRA-TABS) 100 MG tablet Take 1 tablet (100 mg total) by mouth 2 (two) times daily.  20 tablet  0   No current facility-administered medications on file  prior to visit.   Review of Systems  Constitutional: Negative.  Negative for chills, diaphoresis, activity change, appetite change and unexpected weight change.  HENT: Negative for hearing loss, ear pain, nosebleeds, facial swelling, sneezing, mouth sores, trouble swallowing, neck pain, neck stiffness, postnasal drip, sinus pressure and tinnitus.   Eyes: Negative for discharge, redness, itching and visual disturbance.  Respiratory: Negative for chest tightness, wheezing and stridor.   Cardiovascular: Negative for chest pain, palpitations and leg swelling.  Gastrointestinal: Positive for nausea and diarrhea. Negative for constipation, blood in stool, abdominal distention, anal bleeding and rectal pain.  Genitourinary: Negative for dysuria, urgency, frequency, hematuria, flank pain, vaginal bleeding, vaginal discharge, difficulty urinating, genital sores and pelvic pain.  Musculoskeletal: Negative for back pain, joint swelling, arthralgias and gait problem.  Neurological: Negative for dizziness, tremors, seizures, syncope, speech difficulty, weakness, numbness and headaches.  Hematological: Negative for adenopathy. Does not bruise/bleed easily.  Psychiatric/Behavioral: Negative for suicidal ideas, behavioral problems, sleep disturbance, dysphoric mood and decreased concentration. The patient is not nervous/anxious.        Objective:   Physical Exam  Constitutional: She appears well-developed. No distress.  Obese Tired  HENT:  Head: Normocephalic.  Right Ear: External ear normal.  Left Ear:  External ear normal.  Nose: Nose normal.  Mouth/Throat: Oropharynx is clear and moist.  Eyes: Conjunctivae are normal. Pupils are equal, round, and reactive to light. Right eye exhibits no discharge. Left eye exhibits no discharge.  Neck: Normal range of motion. Neck supple. No JVD present. No tracheal deviation present. No thyromegaly present.  Cardiovascular: Normal rate, regular rhythm and normal  heart sounds.   Pulmonary/Chest: No stridor. No respiratory distress. She has no wheezes.  Abdominal: Soft. Bowel sounds are normal. She exhibits no distension and no mass. There is no tenderness. There is no rebound and no guarding.  Musculoskeletal: She exhibits no edema and no tenderness.  Lymphadenopathy:    She has no cervical adenopathy.  Neurological: She displays normal reflexes. No cranial nerve deficit. She exhibits normal muscle tone. Coordination normal.  Skin: No rash noted. No erythema.  Psychiatric: She has a normal mood and affect. Her behavior is normal. Judgment and thought content normal.   BP 110/76  Pulse 72  Temp(Src) 98.1 F (36.7 C) (Oral)  Resp 16  Wt 200 lb (90.719 kg)  BMI 34.31 kg/m2         Assessment & Plan:

## 2013-03-13 NOTE — Assessment & Plan Note (Signed)
Continue with current prescription therapy as reflected on the Med list. Labs  

## 2013-03-13 NOTE — Assessment & Plan Note (Signed)
7/14 Hgb 10 She is a blood donor She is on iron po Labs in 1 mo

## 2013-03-26 ENCOUNTER — Ambulatory Visit (INDEPENDENT_AMBULATORY_CARE_PROVIDER_SITE_OTHER): Payer: BC Managed Care – PPO | Admitting: *Deleted

## 2013-03-26 DIAGNOSIS — G245 Blepharospasm: Secondary | ICD-10-CM

## 2013-03-26 DIAGNOSIS — K5289 Other specified noninfective gastroenteritis and colitis: Secondary | ICD-10-CM

## 2013-03-26 DIAGNOSIS — D649 Anemia, unspecified: Secondary | ICD-10-CM

## 2013-03-26 DIAGNOSIS — F411 Generalized anxiety disorder: Secondary | ICD-10-CM

## 2013-03-26 DIAGNOSIS — K529 Noninfective gastroenteritis and colitis, unspecified: Secondary | ICD-10-CM

## 2013-03-26 DIAGNOSIS — E039 Hypothyroidism, unspecified: Secondary | ICD-10-CM

## 2013-03-26 LAB — CBC WITH DIFFERENTIAL/PLATELET
Basophils Absolute: 0 10*3/uL (ref 0.0–0.1)
Basophils Relative: 0.6 % (ref 0.0–3.0)
Eosinophils Absolute: 0.1 10*3/uL (ref 0.0–0.7)
Hemoglobin: 13.5 g/dL (ref 12.0–15.0)
Lymphs Abs: 2.5 10*3/uL (ref 0.7–4.0)
MCHC: 33.5 g/dL (ref 30.0–36.0)
MCV: 98.5 fl (ref 78.0–100.0)
Monocytes Absolute: 0.6 10*3/uL (ref 0.1–1.0)
Neutro Abs: 4.6 10*3/uL (ref 1.4–7.7)
RBC: 4.1 Mil/uL (ref 3.87–5.11)
RDW: 14.2 % (ref 11.5–14.6)

## 2013-03-26 LAB — BASIC METABOLIC PANEL
BUN: 19 mg/dL (ref 6–23)
CO2: 24 mEq/L (ref 19–32)
Calcium: 9 mg/dL (ref 8.4–10.5)
Chloride: 108 mEq/L (ref 96–112)
Creatinine, Ser: 1 mg/dL (ref 0.4–1.2)
GFR: 62.04 mL/min (ref >60.00)
Glucose, Bld: 96 mg/dL (ref 70–99)
Potassium: 4.1 mEq/L (ref 3.5–5.1)
Sodium: 140 mEq/L (ref 135–145)

## 2013-03-26 LAB — HEPATIC FUNCTION PANEL
AST: 26 U/L (ref 0–37)
Alkaline Phosphatase: 55 U/L (ref 39–117)
Total Bilirubin: 0.8 mg/dL (ref 0.3–1.2)

## 2013-03-26 LAB — IBC PANEL
Saturation Ratios: 41.1 % (ref 20.0–50.0)
Transferrin: 192.9 mg/dL — ABNORMAL LOW (ref 212.0–360.0)

## 2013-03-26 MED ORDER — LEVOTHYROXINE SODIUM 125 MCG PO TABS: 125.0000 ug | ORAL_TABLET | Freq: Every day | ORAL | Status: DC

## 2013-03-27 ENCOUNTER — Encounter: Payer: Self-pay | Admitting: Internal Medicine

## 2013-04-02 ENCOUNTER — Ambulatory Visit: Payer: BC Managed Care – PPO | Admitting: Internal Medicine

## 2013-06-19 ENCOUNTER — Other Ambulatory Visit: Payer: Self-pay

## 2013-07-27 ENCOUNTER — Other Ambulatory Visit: Payer: Self-pay | Admitting: Internal Medicine

## 2013-08-16 ENCOUNTER — Other Ambulatory Visit: Payer: Self-pay | Admitting: Internal Medicine

## 2013-11-25 ENCOUNTER — Ambulatory Visit (INDEPENDENT_AMBULATORY_CARE_PROVIDER_SITE_OTHER): Payer: Medicare Other | Admitting: Internal Medicine

## 2013-11-25 ENCOUNTER — Encounter: Payer: Self-pay | Admitting: Internal Medicine

## 2013-11-25 VITALS — BP 108/80 | HR 94 | Temp 98.2°F | Resp 18 | Wt 199.5 lb

## 2013-11-25 DIAGNOSIS — B029 Zoster without complications: Secondary | ICD-10-CM

## 2013-11-25 MED ORDER — HYDROCODONE-ACETAMINOPHEN 10-325 MG PO TABS: 1.0000 | ORAL_TABLET | Freq: Three times a day (TID) | ORAL | Status: DC | PRN

## 2013-11-25 MED ORDER — IBUPROFEN 800 MG PO TABS: 800.0000 mg | ORAL_TABLET | Freq: Three times a day (TID) | ORAL | Status: DC | PRN

## 2013-11-25 MED ORDER — ACYCLOVIR 800 MG PO TABS: 800.0000 mg | ORAL_TABLET | Freq: Every day | ORAL | Status: DC

## 2013-11-25 MED ORDER — TRIAMCINOLONE ACETONIDE 0.5 % EX CREA: TOPICAL_CREAM | Freq: Three times a day (TID) | CUTANEOUS | Status: DC

## 2013-11-25 NOTE — Addendum Note (Signed)
Addended by: Crecencio Mc on: 11/25/2013 10:02 PM   Modules accepted: Level of Service

## 2013-11-25 NOTE — Assessment & Plan Note (Signed)
Acyclovir, vicodin prn .  Contact precautions discussed  Patient wants to postpone trip to the beach this week.  Note written.

## 2013-11-25 NOTE — Progress Notes (Addendum)
Patient ID: Linda Hayes, female   DOB: [birth year], 63 y.o.   MRN: [id number]    Patient Active Problem List   Diagnosis Date Noted  . Gastroenteritis, acute 03/13/2013  . Anemia 03/13/2013  . Acute sinusitis 11/12/2012  . Rash of back 05/27/2012  . Herpes zoster 05/27/2012  . Left LBP 05/27/2012  . Acute sinus infection 05/01/2011  . 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 GAIT ATAXIA 06/11/2009  . Palpitations 10/01/2008  . HYPOTHYROIDISM 03/17/2008  . OBESITY 03/17/2008  . ANXIETY 03/17/2008  . INSOMNIA, PERSISTENT 03/17/2008  . DEPRESSION 03/17/2008  . SWEATING 03/17/2008    Subjective:  CC:   Chief Complaint  Patient presents with  . Acute Visit    shingles    HPI:   Linda Hayes is a 63 y.o. female who presents for evaluation of a  Painful blistering rash on right side of face including inside of ear ,  mouth and cheekbone,  The rash started 2 days ago and has become quite painful.  She is using ibuprofen for pain.  She had a Prior episode 2 years ago that occurred on her thoracic region on her back,  Left side. [name]  Has not had the shingles vaccine.    Past Medical History  Diagnosis Date  . ANXIETY 03/17/2008  . ASTHMATIC BRONCHITIS, ACUTE 10/01/2008  . 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 DEPRESSION 03/17/2008  . GAIT ATAXIA 06/11/2009  . HYPOTHYROIDISM 03/17/2008  . INSOMNIA, PERSISTENT 03/17/2008  . OBESITY 03/17/2008  . Palpitations 10/01/2008  . SWEATING 03/17/2008    Past Surgical History  Procedure Laterality Date  . Thyroidectomy  1999       The following portions of the patient's history were reviewed and updated as appropriate: Allergies, current medications, and problem list.    Review of Systems:   Patient denies headache, fevers, malaise, unintentional weight loss, skin rash, eye pain, sinus congestion and sinus pain, sore throat, dysphagia,  hemoptysis , cough, dyspnea, wheezing, chest pain, palpitations, orthopnea, edema, abdominal pain, nausea, melena, diarrhea,  constipation, flank pain, dysuria, hematuria, urinary  Frequency, nocturia, numbness, tingling, seizures,  Focal weakness, Loss of consciousness,  Tremor, insomnia, depression, anxiety, and suicidal ideation.     History   Social History  . Marital Status: Married    Spouse Name: N/A    Number of Children: N/A  . Years of Education: N/A   Occupational History  . Not on file.   Social History Main Topics  . Smoking status: Never Smoker   . Smokeless tobacco: Not on file  . Alcohol Use: Not on file  . Drug Use: Not on file  . Sexual Activity: Not on file   Other Topics Concern  . Not on file   Social History Narrative  . No narrative on file    Objective:  Filed Vitals:   11/25/13 1338  BP: 108/80  Pulse: 94  Temp: 98.2 F (36.8 C)  Resp: 18     General appearance: alert, cooperative and appears stated age Ears: normal TM's and external ear canals both ears Throat: lips, mucosa, and tongue normal; teeth and gums normal Neck: no adenopathy, no carotid bruit, supple, symmetrical, trachea midline and thyroid not enlarged, symmetric, no tenderness/mass/nodules Back: symmetric, no curvature. ROM normal. No CVA tenderness. Lungs: clear to auscultation bilaterally Heart: regular rate and rhythm, S1, S2 normal, no murmur, click, rub or gallop Abdomen: soft, non-tender; bowel sounds normal; no masses,  no organomegaly Pulses: 2+ and symmetric Skin: herpet iformvesicular rash in  facial nerve distribution right side of face. Skin color, texture, turgor normal. N Lymph nodes: Cervical, supraclavicular, and axillary nodes normal.  Assessment and Plan:  Herpes zoster Acyclovir, vicodin prn .  Contact precautions discussed  Patient wants to postpone trip to the beach this week.  Note written.    Updated Medication List Outpatient Encounter Prescriptions as of 11/25/2013  Medication Sig  . ALPRAZolam (XANAX) 0.5 MG tablet 1/2 tablet once daily prn   . cholecalciferol  (VITAMIN D) 1000 UNITS tablet Take 1,000 Units by mouth daily.    [name] ibuprofen (ADVIL,MOTRIN) 800 MG tablet Take 1 tablet (800 mg total) by mouth every 8 (eight) hours as needed.  . sertraline (ZOLOFT) 100 MG tablet Take 100 mg by mouth daily.    [name] SYNTHROID 125 MCG tablet TAKE 1 TABLET (125 MCG TOTAL) BY MOUTH DAILY.  [name] zolpidem (AMBIEN) 10 MG tablet 1/2 - 1 tablet at bedtime as needed for insomnia   . [DISCONTINUED] ibuprofen (ADVIL,MOTRIN) 600 MG tablet Take 1 tablet (600 mg total) by mouth every 8 (eight) hours as needed for pain.  [name] acyclovir (ZOVIRAX) 800 MG tablet Take 1 tablet (800 mg total) by mouth 5 (five) times daily.  [name] HYDROcodone-acetaminophen (NORCO) 10-325 MG per tablet Take 1 tablet by mouth every 8 (eight) hours as needed.  . ondansetron (ZOFRAN) 4 MG tablet Take 1 tablet (4 mg total) by mouth every 8 (eight) hours as needed for nausea.  [name] triamcinolone cream (KENALOG) 0.5 % Apply topically 3 (three) times daily.  . [DISCONTINUED] triamcinolone cream (KENALOG) 0.5 % Apply topically 3 (three) times daily.     No orders of the defined types were placed in this encounter.    No Follow-up on file.

## 2013-11-25 NOTE — Progress Notes (Signed)
Pre-visit discussion using our clinic review tool. No additional management support is needed unless otherwise documented below in the visit note.  

## 2013-11-25 NOTE — Patient Instructions (Addendum)
Your shingles episode should resolve in a week with use of acyclovir  As prescribed  You may use ibuprofen 800 mg up to  three times daily and add vicodin if needed for severe pain  Please see your ophthalmologist if it spreads to involve your eyelid or eye.   You should avoid contact with pregnant women and consider getting the Zostvax (Shingles vaccine) in 6 months

## 2013-12-02 ENCOUNTER — Ambulatory Visit (INDEPENDENT_AMBULATORY_CARE_PROVIDER_SITE_OTHER): Payer: Medicare Other | Admitting: Internal Medicine

## 2013-12-02 ENCOUNTER — Encounter: Payer: Self-pay | Admitting: Internal Medicine

## 2013-12-02 VITALS — BP 130/90 | HR 90 | Temp 97.8°F | Resp 13 | Wt 198.6 lb

## 2013-12-02 DIAGNOSIS — B029 Zoster without complications: Secondary | ICD-10-CM

## 2013-12-02 DIAGNOSIS — B0229 Other postherpetic nervous system involvement: Secondary | ICD-10-CM

## 2013-12-02 MED ORDER — GABAPENTIN 100 MG PO CAPS: ORAL_CAPSULE | ORAL | Status: DC

## 2013-12-02 NOTE — Progress Notes (Signed)
   Subjective:    Patient ID: Linda Hayes, female    DOB: 1951/06/30, 63 y.o.   MRN: [id number]  HPI  She developed shingles in the C2 dermatome 4/13; she was seen on 4/14 and placed on antiviral medication  She now presents because of severe pain in the right ear which she describes as "electric". Vicodin was of no benefit. She's been taking ibuprofen 800 mg at a dose with minimal response  10 days ago she did try to remove wax with bobby pin from the right ear  She questions whether she injured the ear and caused an ear infection.   She has a history of facial tics for which she has taken gabapentin this did not help that. Xanax is of benefit.   Review of Systems  She denies any fever, chills, sweats.  There's been no purulent discharge from the ear     Objective:   Physical Exam General appearance:good health ;well nourished; no acute distress or increased work of breathing is present.  No  lymphadenopathy about the head, neck, or axilla noted.   Eyes: No conjunctival inflammation or lid edema is present. There is no scleral icterus.  Ears:  External ear exam shows no significant lesions or deformities.  Otoscopic examination reveals normal left tympanic membrane. Right tympanic membrane is partially obscured by wax in external canal. The tympanic membrane appears dull. Right canal is exquisitely tender when an attempt was made to remove the wax with a speculum. After gentle gavage the TM could be fully visualized. Dull w/o erythema or exudate Nose:  External nasal examination shows no deformity or inflammation. Nasal mucosa are pink and moist without lesions or exudates. No septal dislocation or deviation.No obstruction to airflow.   Oral exam: Dental hygiene is good; lips and gums are healthy appearing.There is no oropharyngeal erythema or exudate noted.   Neck:  No deformities, thyromegaly, masses, or tenderness noted.   Supple with full range of motion without pain.    Heart:  Normal rate and regular rhythm. S1 and S2 normal without gallop, murmur, click, rub or other extra sounds.   Lungs:Chest clear to auscultation; no wheezes, rhonchi,rales ,or rubs present.No increased work of breathing.    Extremities:  No cyanosis, edema, or clubbing  noted   Neuro: She has intermittent facial spasms, right > left   Skin: Warm & dry ; there is irregular , faint erythematous changes over the second cervical nerve dermatome. No vesicles are present.         Assessment & Plan:  #1 postherpetic neuralgia. No evidence of otitis.  #2 cervical herpes zoster, responsive to the antiviral.  Plan: Titration of gabapentin.

## 2013-12-02 NOTE — Patient Instructions (Signed)
Assess response to the gabapentin one every 8 hours as needed. If it is partially beneficial, it can be increased up to a total of 3 pills every 8 hours as needed. This increase of 1 pill each dose  should take place over 48- 72 hours at least.

## 2013-12-02 NOTE — Progress Notes (Signed)
Pre visit review using our clinic review tool, if applicable. No additional management support is needed unless otherwise documented below in the visit note. 

## 2013-12-05 MED ORDER — ONDANSETRON HCL 4 MG PO TABS: 4.0000 mg | ORAL_TABLET | Freq: Three times a day (TID) | ORAL | Status: DC | PRN

## 2013-12-05 NOTE — Addendum Note (Signed)
Addended by: Crecencio Mc on: 12/05/2013 12:28 PM   Modules accepted: Orders

## 2013-12-17 ENCOUNTER — Encounter: Payer: Self-pay | Admitting: Adult Health

## 2013-12-17 ENCOUNTER — Ambulatory Visit (INDEPENDENT_AMBULATORY_CARE_PROVIDER_SITE_OTHER): Payer: Medicare Other | Admitting: Adult Health

## 2013-12-17 VITALS — BP 116/70 | HR 82 | Temp 98.2°F | Resp 12 | Wt 197.5 lb

## 2013-12-17 DIAGNOSIS — B0229 Other postherpetic nervous system involvement: Secondary | ICD-10-CM

## 2013-12-17 DIAGNOSIS — R059 Cough, unspecified: Secondary | ICD-10-CM | POA: Diagnosis not present

## 2013-12-17 DIAGNOSIS — R05 Cough: Secondary | ICD-10-CM | POA: Diagnosis not present

## 2013-12-17 MED ORDER — BENZONATATE 200 MG PO CAPS: 200.0000 mg | ORAL_CAPSULE | Freq: Three times a day (TID) | ORAL | Status: DC | PRN

## 2013-12-17 MED ORDER — TRAMADOL HCL 50 MG PO TABS: 50.0000 mg | ORAL_TABLET | Freq: Three times a day (TID) | ORAL | Status: DC | PRN

## 2013-12-17 MED ORDER — FLUTICASONE PROPIONATE 50 MCG/ACT NA SUSP: 2.0000 | Freq: Every day | NASAL | Status: DC

## 2013-12-17 MED ORDER — GABAPENTIN 300 MG PO CAPS: ORAL_CAPSULE | ORAL | Status: DC

## 2013-12-17 NOTE — Patient Instructions (Addendum)
  Start using flonase nasal spray - 2 sprays into each nostril daily.  Irrigate sinuses with saline.   Mouth rinse without any alcohol.  For the shingles pain:   Take neurontin 300 mg today, then take 300 mg twice daily tomorrow, then take  300 mg 3 times a day. This is for nerve pain.   Tramadol 50 mg every 8 hours as needed for pain.  Tessalon 200 mg 3 times a day as needed for cough.  You can also try over the counter Delsym or Robitussin with guaifenesin and dextromethorphan.  Please call the office if no improvement within 4-5 days or sooner if needed.

## 2013-12-17 NOTE — Progress Notes (Signed)
Pre visit review using our clinic review tool, if applicable. No additional management support is needed unless otherwise documented below in the visit note. 

## 2013-12-17 NOTE — Progress Notes (Signed)
Subjective:    Patient ID: Linda Hayes, female    DOB: 01-Aug-1951, 63 y.o.   MRN: [id number]  HPI Pt is a 63 y/o female with recent facial shingles who presents with ongoing pain. She also reports that her tongue is sensitive and swollen. No difficulty breathing. She is having dry coughing spells and reports that her sinuses are congested. She has not tried any over the counter medication. She has not taken any cough medication. No fever, chills. No discolored drainage.  Current Outpatient Prescriptions on File Prior to Visit  Medication Sig Dispense Refill  . ALPRAZolam (XANAX) 0.5 MG tablet 1/2 tablet once daily prn       . cholecalciferol (VITAMIN D) 1000 UNITS tablet Take 1,000 Units by mouth daily.        [name] ibuprofen (ADVIL,MOTRIN) 800 MG tablet Take 1 tablet (800 mg total) by mouth every 8 (eight) hours as needed.  60 tablet  0  . sertraline (ZOLOFT) 100 MG tablet Take 100 mg by mouth daily.        [name] SYNTHROID 125 MCG tablet TAKE 1 TABLET (125 MCG TOTAL) BY MOUTH DAILY.  30 tablet  5  . triamcinolone cream (KENALOG) 0.5 % Apply topically 3 (three) times daily.  60 g  1  . zolpidem (AMBIEN) 10 MG tablet 1/2 - 1 tablet at bedtime as needed for insomnia        No current facility-administered medications on file prior to visit.    Review of Systems  Constitutional: Negative for fever and chills.  HENT: Positive for congestion and postnasal drip. Sore throat: throat irritation from coughing.   Respiratory: Positive for cough. Negative for shortness of breath and wheezing.   Skin: Positive for rash (on face from shingles almost resolved).  Psychiatric/Behavioral: Negative.   All other systems reviewed and are negative.      Objective:   Physical Exam  Constitutional: She is oriented to person, place, and time. She appears well-developed and well-nourished. No distress.  Appears uncomfortable from post herpetic neuralgia   HENT:  Head: Normocephalic and atraumatic.  Right  Ear: External ear normal.  Left Ear: External ear normal.  Mouth/Throat: No oropharyngeal exudate.  Cardiovascular: Normal rate, regular rhythm and normal heart sounds.  Exam reveals no gallop.   No murmur heard. Pulmonary/Chest: Effort normal and breath sounds normal. No respiratory distress. She has no wheezes. She has no rales.  Lymphadenopathy:    She has cervical adenopathy.  Neurological: She is alert and oriented to person, place, and time.  Skin: Rash (almost completely resolved on right side of face) noted.  Psychiatric: She has a normal mood and affect. Her behavior is normal. Judgment and thought content normal.      Assessment & Plan:   1. Cough Tessalon for cough. Flonase nasal spray as directed. Saline spray to irrigate sinuses. Pt does not required an antibiotic at this time. She will call if she develops a fever of 101 or greater, chest congestion, purulent looking drainage.  2. Post herpetic neuralgia Pt reports pain at the site of shingles on her face. She has only been taking neurontin 100 mg daily and reports this is not helping. Cannot tolerate hydrocodone cause it makes her sick to her stomach. Start neurontin 300 mg today, then 300 mg bid tomorrow and then 300 mg tid. Advised that this medication may cause sedation. I have also provided her with tramadol 50 mg bid to see if this will also help with  her pain. Discussed that pain associated with shingles can last for 12 month or greater. Please see pt instructions for full POC.

## 2014-02-07 ENCOUNTER — Other Ambulatory Visit: Payer: Self-pay | Admitting: Internal Medicine

## 2014-03-24 ENCOUNTER — Encounter: Payer: Self-pay | Admitting: Internal Medicine

## 2014-03-24 ENCOUNTER — Other Ambulatory Visit: Payer: Self-pay | Admitting: Internal Medicine

## 2014-03-24 ENCOUNTER — Other Ambulatory Visit: Payer: Medicare Other

## 2014-03-24 DIAGNOSIS — Z0000 Encounter for general adult medical examination without abnormal findings: Secondary | ICD-10-CM

## 2014-03-26 ENCOUNTER — Other Ambulatory Visit (INDEPENDENT_AMBULATORY_CARE_PROVIDER_SITE_OTHER): Payer: Medicare Other

## 2014-03-26 DIAGNOSIS — E039 Hypothyroidism, unspecified: Secondary | ICD-10-CM

## 2014-03-26 DIAGNOSIS — Z0000 Encounter for general adult medical examination without abnormal findings: Secondary | ICD-10-CM | POA: Diagnosis not present

## 2014-03-26 LAB — LDL CHOLESTEROL, DIRECT: LDL DIRECT: 153 mg/dL

## 2014-03-26 LAB — CBC WITH DIFFERENTIAL/PLATELET
BASOS PCT: 0.8 % (ref 0.0–3.0)
Basophils Absolute: 0.1 10*3/uL (ref 0.0–0.1)
EOS ABS: 0.2 10*3/uL (ref 0.0–0.7)
EOS PCT: 2.8 % (ref 0.0–5.0)
HEMATOCRIT: 43.6 % (ref 36.0–46.0)
Hemoglobin: 14.9 g/dL (ref 12.0–15.0)
LYMPHS ABS: 3.5 10*3/uL (ref 0.7–4.0)
Lymphocytes Relative: 45.3 % (ref 12.0–46.0)
MCHC: 34.1 g/dL (ref 30.0–36.0)
MCV: 96.3 fl (ref 78.0–100.0)
MONO ABS: 0.5 10*3/uL (ref 0.1–1.0)
Monocytes Relative: 6.7 % (ref 3.0–12.0)
Neutro Abs: 3.4 10*3/uL (ref 1.4–7.7)
Neutrophils Relative %: 44.4 % (ref 43.0–77.0)
Platelets: 342 10*3/uL (ref 150.0–400.0)
RBC: 4.53 Mil/uL (ref 3.87–5.11)
RDW: 13.1 % (ref 11.5–15.5)
WBC: 7.7 10*3/uL (ref 4.0–10.5)

## 2014-03-26 LAB — HEPATIC FUNCTION PANEL
ALBUMIN: 4 g/dL (ref 3.5–5.2)
ALT: 20 U/L (ref 0–35)
AST: 23 U/L (ref 0–37)
Alkaline Phosphatase: 71 U/L (ref 39–117)
BILIRUBIN DIRECT: 0 mg/dL (ref 0.0–0.3)
TOTAL PROTEIN: 7.4 g/dL (ref 6.0–8.3)
Total Bilirubin: 1 mg/dL (ref 0.2–1.2)

## 2014-03-26 LAB — BASIC METABOLIC PANEL
BUN: 13 mg/dL (ref 6–23)
CHLORIDE: 105 meq/L (ref 96–112)
CO2: 28 mEq/L (ref 19–32)
Calcium: 9.4 mg/dL (ref 8.4–10.5)
Creatinine, Ser: 0.9 mg/dL (ref 0.4–1.2)
GFR: 70.1 mL/min (ref >60.00)
Glucose, Bld: 93 mg/dL (ref 70–99)
POTASSIUM: 4 meq/L (ref 3.5–5.1)
Sodium: 139 mEq/L (ref 135–145)

## 2014-03-26 LAB — URINALYSIS, ROUTINE W REFLEX MICROSCOPIC
BILIRUBIN URINE: NEGATIVE
Hgb urine dipstick: NEGATIVE
KETONES UR: NEGATIVE
Nitrite: NEGATIVE
PH: 6.5 (ref 5.0–8.0)
Specific Gravity, Urine: <=1.005 — AB (ref 1.000–1.030)
Total Protein, Urine: NEGATIVE
Urine Glucose: NEGATIVE
Urobilinogen, UA: 0.2 (ref 0.0–1.0)

## 2014-03-26 LAB — LIPID PANEL
CHOL/HDL RATIO: 8
CHOLESTEROL: 239 mg/dL — AB (ref 0–200)
HDL: 31.6 mg/dL — ABNORMAL LOW (ref >39.00)
NonHDL: 207.4
Triglycerides: 347 mg/dL — ABNORMAL HIGH (ref 0.0–149.0)
VLDL: 69.4 mg/dL — ABNORMAL HIGH (ref 0.0–40.0)

## 2014-03-26 LAB — TSH: TSH: 0.44 u[IU]/mL (ref 0.35–4.50)

## 2014-03-31 ENCOUNTER — Encounter: Payer: Self-pay | Admitting: Internal Medicine

## 2014-03-31 ENCOUNTER — Ambulatory Visit (INDEPENDENT_AMBULATORY_CARE_PROVIDER_SITE_OTHER): Payer: Medicare Other | Admitting: Internal Medicine

## 2014-03-31 VITALS — BP 130/80 | HR 68 | Temp 98.2°F | Resp 16 | Ht 64.0 in | Wt 199.0 lb

## 2014-03-31 DIAGNOSIS — Z0000 Encounter for general adult medical examination without abnormal findings: Secondary | ICD-10-CM | POA: Diagnosis not present

## 2014-03-31 DIAGNOSIS — Z2911 Encounter for prophylactic immunotherapy for respiratory syncytial virus (RSV): Secondary | ICD-10-CM | POA: Diagnosis not present

## 2014-03-31 DIAGNOSIS — E669 Obesity, unspecified: Secondary | ICD-10-CM

## 2014-03-31 DIAGNOSIS — B0229 Other postherpetic nervous system involvement: Secondary | ICD-10-CM

## 2014-03-31 DIAGNOSIS — F329 Major depressive disorder, single episode, unspecified: Secondary | ICD-10-CM

## 2014-03-31 DIAGNOSIS — F3289 Other specified depressive episodes: Secondary | ICD-10-CM

## 2014-03-31 DIAGNOSIS — Z23 Encounter for immunization: Secondary | ICD-10-CM | POA: Diagnosis not present

## 2014-03-31 DIAGNOSIS — F411 Generalized anxiety disorder: Secondary | ICD-10-CM

## 2014-03-31 DIAGNOSIS — E785 Hyperlipidemia, unspecified: Secondary | ICD-10-CM | POA: Insufficient documentation

## 2014-03-31 DIAGNOSIS — E039 Hypothyroidism, unspecified: Secondary | ICD-10-CM

## 2014-03-31 NOTE — Assessment & Plan Note (Signed)
Continue with current prescription therapy as reflected on the Med list.  

## 2014-03-31 NOTE — Assessment & Plan Note (Signed)
Pt has declined statins Krill oil, diet, wt loss

## 2014-03-31 NOTE — Assessment & Plan Note (Signed)
Here for medicare wellness/physical  Diet: heart healthy  Physical activity: not sedentary  Depression/mood screen: negative  Hearing: intact to whispered voice  Visual acuity: grossly normal, performs annual eye exam  ADLs: capable  Fall risk: none  Home safety: good  Cognitive evaluation: intact to orientation, naming, recall and repetition  EOL planning: adv directives, full code/ I agree  I have personally reviewed and have noted  1. The patient's medical and social history  2. Their use of alcohol, tobacco or illicit drugs  3. Their current medications and supplements  4. The patient's functional ability including ADL's, fall risks, home safety risks and hearing or visual impairment.  5. Diet and physical activities  6. Evidence for depression or mood disorders    Today patient counseled on age appropriate routine health concerns for screening and prevention, each reviewed and up to date or declined. Immunizations reviewed and up to date or declined. Labs ordered and reviewed. Risk factors for depression reviewed and negative. Hearing function and visual acuity are intact. ADLs screened and addressed as needed. Functional ability and level of safety reviewed and appropriate. Education, counseling and referrals performed based on assessed risks today. Patient provided with a copy of personalized plan for preventive services.  Zostavax given Colon up to date PAP due

## 2014-03-31 NOTE — Patient Instructions (Signed)
Preventive Care for Adults A healthy lifestyle and preventive care can promote health and wellness. Preventive health guidelines for women include the following key practices.  A routine yearly physical is a good way to check with your health care provider about your health and preventive screening. It is a chance to share any concerns and updates on your health and to receive a thorough exam.  Visit your dentist for a routine exam and preventive care every 6 months. Brush your teeth twice a day and floss once a day. Good oral hygiene prevents tooth decay and gum disease.  The frequency of eye exams is based on your age, health, family medical history, use of contact lenses, and other factors. Follow your health care provider's recommendations for frequency of eye exams.  Eat a healthy diet. Foods like vegetables, fruits, whole grains, low-fat dairy products, and lean protein foods contain the nutrients you need without too many calories. Decrease your intake of foods high in solid fats, added sugars, and salt. Eat the right amount of calories for you.Get information about a proper diet from your health care provider, if necessary.  Regular physical exercise is one of the most important things you can do for your health. Most adults should get at least 150 minutes of moderate-intensity exercise (any activity that increases your heart rate and causes you to sweat) each week. In addition, most adults need muscle-strengthening exercises on 2 or more days a week.  Maintain a healthy weight. The body mass index (BMI) is a screening tool to identify possible weight problems. It provides an estimate of body fat based on height and weight. Your health care provider can find your BMI and can help you achieve or maintain a healthy weight.For adults 20 years and older:  A BMI below 18.5 is considered underweight.  A BMI of 18.5 to 24.9 is normal.  A BMI of 25 to 29.9 is considered overweight.  A BMI of  30 and above is considered obese.  Maintain normal blood lipids and cholesterol levels by exercising and minimizing your intake of saturated fat. Eat a balanced diet with plenty of fruit and vegetables. Blood tests for lipids and cholesterol should begin at age 76 and be repeated every 5 years. If your lipid or cholesterol levels are high, you are over 50, or you are at high risk for heart disease, you may need your cholesterol levels checked more frequently.Ongoing high lipid and cholesterol levels should be treated with medicines if diet and exercise are not working.  If you smoke, find out from your health care provider how to quit. If you do not use tobacco, do not start.  Lung cancer screening is recommended for adults aged 22-80 years who are at high risk for developing lung cancer because of a history of smoking. A yearly low-dose CT scan of the lungs is recommended for people who have at least a 30-pack-year history of smoking and are a current smoker or have quit within the past 15 years. A pack year of smoking is smoking an average of 1 pack of cigarettes a day for 1 year (for example: 1 pack a day for 30 years or 2 packs a day for 15 years). Yearly screening should continue until the smoker has stopped smoking for at least 15 years. Yearly screening should be stopped for people who develop a health problem that would prevent them from having lung cancer treatment.  If you are pregnant, do not drink alcohol. If you are breastfeeding,  be very cautious about drinking alcohol. If you are not pregnant and choose to drink alcohol, do not have more than 1 drink per day. One drink is considered to be 12 ounces (355 mL) of beer, 5 ounces (148 mL) of wine, or 1.5 ounces (44 mL) of liquor.  Avoid use of street drugs. Do not share needles with anyone. Ask for help if you need support or instructions about stopping the use of drugs.  High blood pressure causes heart disease and increases the risk of  stroke. Your blood pressure should be checked at least every 1 to 2 years. Ongoing high blood pressure should be treated with medicines if weight loss and exercise do not work.  If you are 75-52 years old, ask your health care provider if you should take aspirin to prevent strokes.  Diabetes screening involves taking a blood sample to check your fasting blood sugar level. This should be done once every 3 years, after age 15, if you are within normal weight and without risk factors for diabetes. Testing should be considered at a younger age or be carried out more frequently if you are overweight and have at least 1 risk factor for diabetes.  Breast cancer screening is essential preventive care for women. You should practice "breast self-awareness." This means understanding the normal appearance and feel of your breasts and may include breast self-examination. Any changes detected, no matter how small, should be reported to a health care provider. Women in their 58s and 30s should have a clinical breast exam (CBE) by a health care provider as part of a regular health exam every 1 to 3 years. After age 16, women should have a CBE every year. Starting at age 53, women should consider having a mammogram (breast X-ray test) every year. Women who have a family history of breast cancer should talk to their health care provider about genetic screening. Women at a high risk of breast cancer should talk to their health care providers about having an MRI and a mammogram every year.  Breast cancer gene (BRCA)-related cancer risk assessment is recommended for women who have family members with BRCA-related cancers. BRCA-related cancers include breast, ovarian, tubal, and peritoneal cancers. Having family members with these cancers may be associated with an increased risk for harmful changes (mutations) in the breast cancer genes BRCA1 and BRCA2. Results of the assessment will determine the need for genetic counseling and  BRCA1 and BRCA2 testing.  Routine pelvic exams to screen for cancer are no longer recommended for nonpregnant women who are considered low risk for cancer of the pelvic organs (ovaries, uterus, and vagina) and who do not have symptoms. Ask your health care provider if a screening pelvic exam is right for you.  If you have had past treatment for cervical cancer or a condition that could lead to cancer, you need Pap tests and screening for cancer for at least 20 years after your treatment. If Pap tests have been discontinued, your risk factors (such as having a new sexual partner) need to be reassessed to determine if screening should be resumed. Some women have medical problems that increase the chance of getting cervical cancer. In these cases, your health care provider may recommend more frequent screening and Pap tests.  The HPV test is an additional test that may be used for cervical cancer screening. The HPV test looks for the virus that can cause the cell changes on the cervix. The cells collected during the Pap test can be  tested for HPV. The HPV test could be used to screen women aged 30 years and older, and should be used in women of any age who have unclear Pap test results. After the age of 30, women should have HPV testing at the same frequency as a Pap test.  Colorectal cancer can be detected and often prevented. Most routine colorectal cancer screening begins at the age of 50 years and continues through age 75 years. However, your health care provider may recommend screening at an earlier age if you have risk factors for colon cancer. On a yearly basis, your health care provider may provide home test kits to check for hidden blood in the stool. Use of a small camera at the end of a tube, to directly examine the colon (sigmoidoscopy or colonoscopy), can detect the earliest forms of colorectal cancer. Talk to your health care provider about this at age 50, when routine screening begins. Direct  exam of the colon should be repeated every 5-10 years through age 75 years, unless early forms of pre-cancerous polyps or small growths are found.  People who are at an increased risk for hepatitis B should be screened for this virus. You are considered at high risk for hepatitis B if:  You were born in a country where hepatitis B occurs often. Talk with your health care provider about which countries are considered high risk.  Your parents were born in a high-risk country and you have not received a shot to protect against hepatitis B (hepatitis B vaccine).  You have HIV or AIDS.  You use needles to inject street drugs.  You live with, or have sex with, someone who has hepatitis B.  You get hemodialysis treatment.  You take certain medicines for conditions like cancer, organ transplantation, and autoimmune conditions.  Hepatitis C blood testing is recommended for all people born from 1945 through 1965 and any individual with known risks for hepatitis C.  Practice safe sex. Use condoms and avoid high-risk sexual practices to reduce the spread of sexually transmitted infections (STIs). STIs include gonorrhea, chlamydia, syphilis, trichomonas, herpes, HPV, and human immunodeficiency virus (HIV). Herpes, HIV, and HPV are viral illnesses that have no cure. They can result in disability, cancer, and death.  You should be screened for sexually transmitted illnesses (STIs) including gonorrhea and chlamydia if:  You are sexually active and are younger than 24 years.  You are older than 24 years and your health care provider tells you that you are at risk for this type of infection.  Your sexual activity has changed since you were last screened and you are at an increased risk for chlamydia or gonorrhea. Ask your health care provider if you are at risk.  If you are at risk of being infected with HIV, it is recommended that you take a prescription medicine daily to prevent HIV infection. This is  called preexposure prophylaxis (PrEP). You are considered at risk if:  You are a heterosexual woman, are sexually active, and are at increased risk for HIV infection.  You take drugs by injection.  You are sexually active with a partner who has HIV.  Talk with your health care provider about whether you are at high risk of being infected with HIV. If you choose to begin PrEP, you should first be tested for HIV. You should then be tested every 3 months for as long as you are taking PrEP.  Osteoporosis is a disease in which the bones lose minerals and strength   with aging. This can result in serious bone fractures or breaks. The risk of osteoporosis can be identified using a bone density scan. Women ages 65 years and over and women at risk for fractures or osteoporosis should discuss screening with their health care providers. Ask your health care provider whether you should take a calcium supplement or vitamin D to reduce the rate of osteoporosis.  Menopause can be associated with physical symptoms and risks. Hormone replacement therapy is available to decrease symptoms and risks. You should talk to your health care provider about whether hormone replacement therapy is right for you.  Use sunscreen. Apply sunscreen liberally and repeatedly throughout the day. You should seek shade when your shadow is shorter than you. Protect yourself by wearing long sleeves, pants, a wide-brimmed hat, and sunglasses year round, whenever you are outdoors.  Once a month, do a whole body skin exam, using a mirror to look at the skin on your back. Tell your health care provider of new moles, moles that have irregular borders, moles that are larger than a pencil eraser, or moles that have changed in shape or color.  Stay current with required vaccines (immunizations).  Influenza vaccine. All adults should be immunized every year.  Tetanus, diphtheria, and acellular pertussis (Td, Tdap) vaccine. Pregnant women should  receive 1 dose of Tdap vaccine during each pregnancy. The dose should be obtained regardless of the length of time since the last dose. Immunization is preferred during the 27th-36th week of gestation. An adult who has not previously received Tdap or who does not know her vaccine status should receive 1 dose of Tdap. This initial dose should be followed by tetanus and diphtheria toxoids (Td) booster doses every 10 years. Adults with an unknown or incomplete history of completing a 3-dose immunization series with Td-containing vaccines should begin or complete a primary immunization series including a Tdap dose. Adults should receive a Td booster every 10 years.  Varicella vaccine. An adult without evidence of immunity to varicella should receive 2 doses or a second dose if she has previously received 1 dose. Pregnant females who do not have evidence of immunity should receive the first dose after pregnancy. This first dose should be obtained before leaving the health care facility. The second dose should be obtained 4-8 weeks after the first dose.  Human papillomavirus (HPV) vaccine. Females aged 13-26 years who have not received the vaccine previously should obtain the 3-dose series. The vaccine is not recommended for use in pregnant females. However, pregnancy testing is not needed before receiving a dose. If a female is found to be pregnant after receiving a dose, no treatment is needed. In that case, the remaining doses should be delayed until after the pregnancy. Immunization is recommended for any person with an immunocompromised condition through the age of 26 years if she did not get any or all doses earlier. During the 3-dose series, the second dose should be obtained 4-8 weeks after the first dose. The third dose should be obtained 24 weeks after the first dose and 16 weeks after the second dose.  Zoster vaccine. One dose is recommended for adults aged 60 years or older unless certain conditions are  present.  Measles, mumps, and rubella (MMR) vaccine. Adults born before 1957 generally are considered immune to measles and mumps. Adults born in 1957 or later should have 1 or more doses of MMR vaccine unless there is a contraindication to the vaccine or there is laboratory evidence of immunity to   each of the three diseases. A routine second dose of MMR vaccine should be obtained at least 28 days after the first dose for students attending postsecondary schools, health care workers, or international travelers. People who received inactivated measles vaccine or an unknown type of measles vaccine during 1963-1967 should receive 2 doses of MMR vaccine. People who received inactivated mumps vaccine or an unknown type of mumps vaccine before 1979 and are at high risk for mumps infection should consider immunization with 2 doses of MMR vaccine. For females of childbearing age, rubella immunity should be determined. If there is no evidence of immunity, females who are not pregnant should be vaccinated. If there is no evidence of immunity, females who are pregnant should delay immunization until after pregnancy. Unvaccinated health care workers born before 1957 who lack laboratory evidence of measles, mumps, or rubella immunity or laboratory confirmation of disease should consider measles and mumps immunization with 2 doses of MMR vaccine or rubella immunization with 1 dose of MMR vaccine.  Pneumococcal 13-valent conjugate (PCV13) vaccine. When indicated, a person who is uncertain of her immunization history and has no record of immunization should receive the PCV13 vaccine. An adult aged 19 years or older who has certain medical conditions and has not been previously immunized should receive 1 dose of PCV13 vaccine. This PCV13 should be followed with a dose of pneumococcal polysaccharide (PPSV23) vaccine. The PPSV23 vaccine dose should be obtained at least 8 weeks after the dose of PCV13 vaccine. An adult aged 19  years or older who has certain medical conditions and previously received 1 or more doses of PPSV23 vaccine should receive 1 dose of PCV13. The PCV13 vaccine dose should be obtained 1 or more years after the last PPSV23 vaccine dose.  Pneumococcal polysaccharide (PPSV23) vaccine. When PCV13 is also indicated, PCV13 should be obtained first. All adults aged 65 years and older should be immunized. An adult younger than age 65 years who has certain medical conditions should be immunized. Any person who resides in a nursing home or long-term care facility should be immunized. An adult smoker should be immunized. People with an immunocompromised condition and certain other conditions should receive both PCV13 and PPSV23 vaccines. People with human immunodeficiency virus (HIV) infection should be immunized as soon as possible after diagnosis. Immunization during chemotherapy or radiation therapy should be avoided. Routine use of PPSV23 vaccine is not recommended for American Indians, [place] Natives, or people younger than 65 years unless there are medical conditions that require PPSV23 vaccine. When indicated, people who have unknown immunization and have no record of immunization should receive PPSV23 vaccine. One-time revaccination 5 years after the first dose of PPSV23 is recommended for people aged 19-64 years who have chronic kidney failure, nephrotic syndrome, asplenia, or immunocompromised conditions. People who received 1-2 doses of PPSV23 before age 65 years should receive another dose of PPSV23 vaccine at age 65 years or later if at least 5 years have passed since the previous dose. Doses of PPSV23 are not needed for people immunized with PPSV23 at or after age 65 years.  Meningococcal vaccine. Adults with asplenia or persistent complement component deficiencies should receive 2 doses of quadrivalent meningococcal conjugate (MenACWY-D) vaccine. The doses should be obtained at least 2 months apart.  Microbiologists working with certain meningococcal bacteria, military recruits, people at risk during an outbreak, and people who travel to or live in countries with a high rate of meningitis should be immunized. A first-year college student up through age   21 years who is living in a residence hall should receive a dose if she did not receive a dose on or after her 16th birthday. Adults who have certain high-risk conditions should receive one or more doses of vaccine.  Hepatitis A vaccine. Adults who wish to be protected from this disease, have certain high-risk conditions, work with hepatitis A-infected animals, work in hepatitis A research labs, or travel to or work in countries with a high rate of hepatitis A should be immunized. Adults who were previously unvaccinated and who anticipate close contact with an international adoptee during the first 60 days after arrival in the [place] from a country with a high rate of hepatitis A should be immunized.  Hepatitis B vaccine. Adults who wish to be protected from this disease, have certain high-risk conditions, may be exposed to blood or other infectious body fluids, are household contacts or sex partners of hepatitis B positive people, are clients or workers in certain care facilities, or travel to or work in countries with a high rate of hepatitis B should be immunized.  Haemophilus influenzae type b (Hib) vaccine. A previously unvaccinated person with asplenia or sickle cell disease or having a scheduled splenectomy should receive 1 dose of Hib vaccine. Regardless of previous immunization, a recipient of a hematopoietic stem cell transplant should receive a 3-dose series 6-12 months after her successful transplant. Hib vaccine is not recommended for adults with HIV infection. Preventive Services / Frequency Ages 64 to 68 years  Blood pressure check.** / Every 1 to 2 years.  Lipid and cholesterol check.** / Every 5 years beginning at age  22.  Clinical breast exam.** / Every 3 years for women in their 88s and 53s.  BRCA-related cancer risk assessment.** / For women who have family members with a BRCA-related cancer (breast, ovarian, tubal, or peritoneal cancers).  Pap test.** / Every 2 years from ages 90 through 51. Every 3 years starting at age 21 through age 56 or 3 with a history of 3 consecutive normal Pap tests.  HPV screening.** / Every 3 years from ages 24 through ages 1 to 46 with a history of 3 consecutive normal Pap tests.  Hepatitis C blood test.** / For any individual with known risks for hepatitis C.  Skin self-exam. / Monthly.  Influenza vaccine. / Every year.  Tetanus, diphtheria, and acellular pertussis (Tdap, Td) vaccine.** / Consult your health care provider. Pregnant women should receive 1 dose of Tdap vaccine during each pregnancy. 1 dose of Td every 10 years.  Varicella vaccine.** / Consult your health care provider. Pregnant females who do not have evidence of immunity should receive the first dose after pregnancy.  HPV vaccine. / 3 doses over 6 months, if 72 and younger. The vaccine is not recommended for use in pregnant females. However, pregnancy testing is not needed before receiving a dose.  Measles, mumps, rubella (MMR) vaccine.** / You need at least 1 dose of MMR if you were born in 1957 or later. You may also need a 2nd dose. For females of childbearing age, rubella immunity should be determined. If there is no evidence of immunity, females who are not pregnant should be vaccinated. If there is no evidence of immunity, females who are pregnant should delay immunization until after pregnancy.  Pneumococcal 13-valent conjugate (PCV13) vaccine.** / Consult your health care provider.  Pneumococcal polysaccharide (PPSV23) vaccine.** / 1 to 2 doses if you smoke cigarettes or if you have certain conditions.  Meningococcal vaccine.** /  1 dose if you are age 19 to 21 years and a first-year college  student living in a residence hall, or have one of several medical conditions, you need to get vaccinated against meningococcal disease. You may also need additional booster doses.  Hepatitis A vaccine.** / Consult your health care provider.  Hepatitis B vaccine.** / Consult your health care provider.  Haemophilus influenzae type b (Hib) vaccine.** / Consult your health care provider. Ages 40 to 64 years  Blood pressure check.** / Every 1 to 2 years.  Lipid and cholesterol check.** / Every 5 years beginning at age 20 years.  Lung cancer screening. / Every year if you are aged 55-80 years and have a 30-pack-year history of smoking and currently smoke or have quit within the past 15 years. Yearly screening is stopped once you have quit smoking for at least 15 years or develop a health problem that would prevent you from having lung cancer treatment.  Clinical breast exam.** / Every year after age 40 years.  BRCA-related cancer risk assessment.** / For women who have family members with a BRCA-related cancer (breast, ovarian, tubal, or peritoneal cancers).  Mammogram.** / Every year beginning at age 40 years and continuing for as long as you are in good health. Consult with your health care provider.  Pap test.** / Every 3 years starting at age 30 years through age 65 or 70 years with a history of 3 consecutive normal Pap tests.  HPV screening.** / Every 3 years from ages 30 years through ages 65 to 70 years with a history of 3 consecutive normal Pap tests.  Fecal occult blood test (FOBT) of stool. / Every year beginning at age 50 years and continuing until age 75 years. You may not need to do this test if you get a colonoscopy every 10 years.  Flexible sigmoidoscopy or colonoscopy.** / Every 5 years for a flexible sigmoidoscopy or every 10 years for a colonoscopy beginning at age 50 years and continuing until age 75 years.  Hepatitis C blood test.** / For all people born from 1945 through  1965 and any individual with known risks for hepatitis C.  Skin self-exam. / Monthly.  Influenza vaccine. / Every year.  Tetanus, diphtheria, and acellular pertussis (Tdap/Td) vaccine.** / Consult your health care provider. Pregnant women should receive 1 dose of Tdap vaccine during each pregnancy. 1 dose of Td every 10 years.  Varicella vaccine.** / Consult your health care provider. Pregnant females who do not have evidence of immunity should receive the first dose after pregnancy.  Zoster vaccine.** / 1 dose for adults aged 60 years or older.  Measles, mumps, rubella (MMR) vaccine.** / You need at least 1 dose of MMR if you were born in 1957 or later. You may also need a 2nd dose. For females of childbearing age, rubella immunity should be determined. If there is no evidence of immunity, females who are not pregnant should be vaccinated. If there is no evidence of immunity, females who are pregnant should delay immunization until after pregnancy.  Pneumococcal 13-valent conjugate (PCV13) vaccine.** / Consult your health care provider.  Pneumococcal polysaccharide (PPSV23) vaccine.** / 1 to 2 doses if you smoke cigarettes or if you have certain conditions.  Meningococcal vaccine.** / Consult your health care provider.  Hepatitis A vaccine.** / Consult your health care provider.  Hepatitis B vaccine.** / Consult your health care provider.  Haemophilus influenzae type b (Hib) vaccine.** / Consult your health care provider. Ages 65   years and over  Blood pressure check.** / Every 1 to 2 years.  Lipid and cholesterol check.** / Every 5 years beginning at age 22 years.  Lung cancer screening. / Every year if you are aged 73-80 years and have a 30-pack-year history of smoking and currently smoke or have quit within the past 15 years. Yearly screening is stopped once you have quit smoking for at least 15 years or develop a health problem that would prevent you from having lung cancer  treatment.  Clinical breast exam.** / Every year after age 4 years.  BRCA-related cancer risk assessment.** / For women who have family members with a BRCA-related cancer (breast, ovarian, tubal, or peritoneal cancers).  Mammogram.** / Every year beginning at age 40 years and continuing for as long as you are in good health. Consult with your health care provider.  Pap test.** / Every 3 years starting at age 9 years through age 34 or 91 years with 3 consecutive normal Pap tests. Testing can be stopped between 65 and 70 years with 3 consecutive normal Pap tests and no abnormal Pap or HPV tests in the past 10 years.  HPV screening.** / Every 3 years from ages 57 years through ages 64 or 45 years with a history of 3 consecutive normal Pap tests. Testing can be stopped between 65 and 70 years with 3 consecutive normal Pap tests and no abnormal Pap or HPV tests in the past 10 years.  Fecal occult blood test (FOBT) of stool. / Every year beginning at age 15 years and continuing until age 17 years. You may not need to do this test if you get a colonoscopy every 10 years.  Flexible sigmoidoscopy or colonoscopy.** / Every 5 years for a flexible sigmoidoscopy or every 10 years for a colonoscopy beginning at age 86 years and continuing until age 71 years.  Hepatitis C blood test.** / For all people born from [birth year] through 1964 and any individual with known risks for hepatitis C.  Osteoporosis screening.** / A one-time screening for women ages 83 years and over and women at risk for fractures or osteoporosis.  Skin self-exam. / Monthly.  Influenza vaccine. / Every year.  Tetanus, diphtheria, and acellular pertussis (Tdap/Td) vaccine.** / 1 dose of Td every 10 years.  Varicella vaccine.** / Consult your health care provider.  Zoster vaccine.** / 1 dose for adults aged 61 years or older.  Pneumococcal 13-valent conjugate (PCV13) vaccine.** / Consult your health care provider.  Pneumococcal  polysaccharide (PPSV23) vaccine.** / 1 dose for all adults aged 28 years and older.  Meningococcal vaccine.** / Consult your health care provider.  Hepatitis A vaccine.** / Consult your health care provider.  Hepatitis B vaccine.** / Consult your health care provider.  Haemophilus influenzae type b (Hib) vaccine.** / Consult your health care provider. ** Family history and personal history of risk and conditions may change your health care provider's recommendations. Document Released: 09/26/2001 Document Revised: 12/15/2013 Document Reviewed: 12/26/2010 [facility] Patient Information 2015 [place], [place]. This information is not intended to replace advice given to you by your health care provider. Make sure you discuss any questions you have with your health care provider.

## 2014-03-31 NOTE — Assessment & Plan Note (Signed)
Dr Caprice Beaver Continue with current prescription therapy as reflected on the Med list.

## 2014-03-31 NOTE — Assessment & Plan Note (Signed)
Options discussed: Contrave and Belviq; info given. She will discuss w/Dr Caprice Beaver

## 2014-03-31 NOTE — Progress Notes (Signed)
Subjective:   The patient is here for a wellness exam. The patient has been doing well overall without major physical or psychological issues going on lately   Wt Readings from Last 3 Encounters:  03/31/14 199 lb (90.266 kg)  12/17/13 197 lb 8 oz (89.585 kg)  12/02/13 198 lb 9.6 oz (90.084 kg)   BP Readings from Last 3 Encounters:  03/31/14 130/80  12/17/13 116/70  12/02/13 130/90       .HPI     Past Medical History  Diagnosis Date  . ANXIETY 03/17/2008  . ASTHMATIC BRONCHITIS, ACUTE 10/01/2008  . 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 DEPRESSION 03/17/2008  . GAIT ATAXIA 06/11/2009  . HYPOTHYROIDISM 03/17/2008  . INSOMNIA, PERSISTENT 03/17/2008  . OBESITY 03/17/2008  . Palpitations 10/01/2008  . SWEATING 03/17/2008   Past Surgical History  Procedure Laterality Date  . Thyroidectomy  1999    reports that she has never smoked. She does not have any smokeless tobacco history on file. Her alcohol and drug histories are not on file. family history includes Depression in her other; Diabetes in her other; Hypertension in her other. Allergies  Allergen Reactions  . Erythromycin     Intolerance - GI  . Gabapentin     cough  . Hydrocodone Nausea And Vomiting  . Penicillins   . Tramadol     n/v   Current Outpatient Prescriptions on File Prior to Visit  Medication Sig Dispense Refill  . ALPRAZolam (XANAX) 0.5 MG tablet 1/2 tablet once daily prn       . cholecalciferol (VITAMIN D) 1000 UNITS tablet Take 1,000 Units by mouth daily.        . fluticasone (FLONASE) 50 MCG/ACT nasal spray Place 2 sprays into both nostrils daily.  16 g  6  . ibuprofen (ADVIL,MOTRIN) 800 MG tablet Take 1 tablet (800 mg total) by mouth every 8 (eight) hours as needed.  60 tablet  0  . sertraline (ZOLOFT) 100 MG tablet Take 100 mg by mouth daily.        [name] SYNTHROID 125 MCG tablet TAKE 1 TABLET (125 MCG TOTAL) BY MOUTH DAILY.  30 tablet  5  . triamcinolone cream (KENALOG) 0.5 % Apply topically 3 (three) times daily.   60 g  1  . zolpidem (AMBIEN) 10 MG tablet 1/2 - 1 tablet at bedtime as needed for insomnia        No current facility-administered medications on file prior to visit.   Review of Systems  Constitutional: Negative.  Negative for chills, diaphoresis, activity change, appetite change and unexpected weight change.  HENT: Negative for ear pain, facial swelling, hearing loss, mouth sores, nosebleeds, postnasal drip, sinus pressure, sneezing, tinnitus and trouble swallowing.   Eyes: Negative for discharge, redness, itching and visual disturbance.  Respiratory: Negative for chest tightness, wheezing and stridor.   Cardiovascular: Negative for chest pain, palpitations and leg swelling.  Gastrointestinal: Negative for nausea, constipation, blood in stool, abdominal distention, anal bleeding and rectal pain.  Genitourinary: Negative for dysuria, urgency, frequency, hematuria, flank pain, vaginal bleeding, vaginal discharge, difficulty urinating, genital sores and pelvic pain.  Musculoskeletal: Negative for arthralgias, back pain, gait problem, joint swelling, neck pain and neck stiffness.  Neurological: Negative for dizziness, tremors, seizures, syncope, speech difficulty, weakness, numbness and headaches.       Tics, facial  Hematological: Negative for adenopathy. Does not bruise/bleed easily.  Psychiatric/Behavioral: Negative for suicidal ideas, behavioral problems, sleep disturbance, dysphoric mood and decreased concentration. The patient is not nervous/anxious.  Objective:   Physical Exam  Constitutional: She appears well-developed. No distress.  HENT:  Head: Normocephalic.  Right Ear: External ear normal.  Left Ear: External ear normal.  Nose: Nose normal.  Mouth/Throat: Oropharynx is clear and moist.  Eyes: Conjunctivae are normal. Pupils are equal, round, and reactive to light. Right eye exhibits no discharge. Left eye exhibits no discharge.  Neck: Normal range of motion. Neck  supple. No JVD present. No tracheal deviation present. No thyromegaly present.  Cardiovascular: Normal rate, regular rhythm and normal heart sounds.   Pulmonary/Chest: No stridor. No respiratory distress. She has no wheezes.  Abdominal: Soft. Bowel sounds are normal. She exhibits no distension and no mass. There is no tenderness. There is no rebound and no guarding.  Musculoskeletal: She exhibits no edema and no tenderness.  Lymphadenopathy:    She has no cervical adenopathy.  Neurological: She displays normal reflexes. No cranial nerve deficit. She exhibits normal muscle tone. Coordination normal.  Skin: No rash noted. No erythema.  Psychiatric: She has a normal mood and affect. Her behavior is normal. Judgment and thought content normal.   Lab Results  Component Value Date   WBC 7.7 03/26/2014   HGB 14.9 03/26/2014   HCT 43.6 03/26/2014   PLT 342.0 03/26/2014   GLUCOSE 93 03/26/2014   CHOL 239* 03/26/2014   TRIG 347.0* 03/26/2014   HDL 31.60* 03/26/2014   LDLDIRECT 153.0 03/26/2014   ALT 20 03/26/2014   AST 23 03/26/2014   NA 139 03/26/2014   K 4.0 03/26/2014   CL 105 03/26/2014   CREATININE 0.9 03/26/2014   BUN 13 03/26/2014   CO2 28 03/26/2014   TSH 0.44 03/26/2014            Assessment & Plan:

## 2014-03-31 NOTE — Progress Notes (Signed)
Pre visit review using our clinic review tool, if applicable. No additional management support is needed unless otherwise documented below in the visit note. 

## 2014-03-31 NOTE — Assessment & Plan Note (Signed)
Resolving

## 2014-05-27 DIAGNOSIS — H02403 Unspecified ptosis of bilateral eyelids: Secondary | ICD-10-CM | POA: Diagnosis not present

## 2014-05-27 DIAGNOSIS — H02831 Dermatochalasis of right upper eyelid: Secondary | ICD-10-CM | POA: Diagnosis not present

## 2014-05-27 DIAGNOSIS — G245 Blepharospasm: Secondary | ICD-10-CM | POA: Diagnosis not present

## 2014-05-27 DIAGNOSIS — H02834 Dermatochalasis of left upper eyelid: Secondary | ICD-10-CM | POA: Diagnosis not present

## 2014-05-29 ENCOUNTER — Other Ambulatory Visit: Payer: Self-pay

## 2014-06-09 DIAGNOSIS — Z23 Encounter for immunization: Secondary | ICD-10-CM | POA: Diagnosis not present

## 2014-08-25 ENCOUNTER — Other Ambulatory Visit: Payer: Self-pay | Admitting: Internal Medicine

## 2015-01-06 DIAGNOSIS — G245 Blepharospasm: Secondary | ICD-10-CM | POA: Diagnosis not present

## 2015-02-09 ENCOUNTER — Ambulatory Visit (INDEPENDENT_AMBULATORY_CARE_PROVIDER_SITE_OTHER): Payer: Medicare Other | Admitting: Internal Medicine

## 2015-02-09 ENCOUNTER — Encounter: Payer: Self-pay | Admitting: Internal Medicine

## 2015-02-09 VITALS — BP 136/94 | HR 94 | Temp 98.1°F | Wt 200.0 lb

## 2015-02-09 DIAGNOSIS — H9201 Otalgia, right ear: Secondary | ICD-10-CM

## 2015-02-09 MED ORDER — ACYCLOVIR 800 MG PO TABS: 800.0000 mg | ORAL_TABLET | Freq: Every day | ORAL | Status: DC

## 2015-02-09 MED ORDER — DOXYCYCLINE HYCLATE 100 MG PO TABS: 100.0000 mg | ORAL_TABLET | Freq: Two times a day (BID) | ORAL | Status: DC

## 2015-02-09 NOTE — Progress Notes (Signed)
Subjective:   C/o R earache/ scalp pain x 1 mo   Wt Readings from Last 3 Encounters:  02/09/15 200 lb (90.719 kg)  03/31/14 199 lb (90.266 kg)  12/17/13 197 lb 8 oz (89.585 kg)   BP Readings from Last 3 Encounters:  02/09/15 136/94  03/31/14 130/80  12/17/13 116/70       .Otalgia  There is pain in the right ear. This is a new problem. The problem has been unchanged. Pertinent negatives include no headaches, hearing loss or neck pain.       Past Medical History  Diagnosis Date  . ANXIETY 03/17/2008  . ASTHMATIC BRONCHITIS, ACUTE 10/01/2008  . 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 DEPRESSION 03/17/2008  . GAIT ATAXIA 06/11/2009  . HYPOTHYROIDISM 03/17/2008  . INSOMNIA, PERSISTENT 03/17/2008  . OBESITY 03/17/2008  . Palpitations 10/01/2008  . SWEATING 03/17/2008   Past Surgical History  Procedure Laterality Date  . Thyroidectomy  1999    reports that she has never smoked. She does not have any smokeless tobacco history on file. Her alcohol and drug histories are not on file. family history includes Depression in her other; Diabetes in her other; Hypertension in her other. Allergies  Allergen Reactions  . Erythromycin     Intolerance - GI  . Gabapentin     cough  . Hydrocodone Nausea And Vomiting  . Penicillins   . Tramadol     n/v   Current Outpatient Prescriptions on File Prior to Visit  Medication Sig Dispense Refill  . cholecalciferol (VITAMIN D) 1000 UNITS tablet Take 1,000 Units by mouth daily.      . fluticasone (FLONASE) 50 MCG/ACT nasal spray Place 2 sprays into both nostrils daily. 16 g 6  . ibuprofen (ADVIL,MOTRIN) 800 MG tablet Take 1 tablet (800 mg total) by mouth every 8 (eight) hours as needed. 60 tablet 0  . sertraline (ZOLOFT) 100 MG tablet Take 100 mg by mouth daily.      [name] triamcinolone cream (KENALOG) 0.5 % Apply topically 3 (three) times daily. (Patient not taking: Reported on 02/09/2015) 60 g 1   No current facility-administered medications on file prior to  visit.   Review of Systems  Constitutional: Negative.  Negative for chills, diaphoresis, activity change, appetite change and unexpected weight change.  HENT: Positive for ear pain. Negative for facial swelling, hearing loss, mouth sores, nosebleeds, postnasal drip, sinus pressure, sneezing, tinnitus and trouble swallowing.   Eyes: Negative for discharge, redness, itching and visual disturbance.  Respiratory: Negative for chest tightness, wheezing and stridor.   Cardiovascular: Negative for chest pain, palpitations and leg swelling.  Gastrointestinal: Negative for nausea, constipation, blood in stool, abdominal distention, anal bleeding and rectal pain.  Genitourinary: Negative for dysuria, urgency, frequency, hematuria, flank pain, vaginal bleeding, vaginal discharge, difficulty urinating, genital sores and pelvic pain.  Musculoskeletal: Negative for back pain, joint swelling, arthralgias, gait problem, neck pain and neck stiffness.  Neurological: Negative for dizziness, tremors, seizures, syncope, speech difficulty, weakness, numbness and headaches.       Tics, facial  Hematological: Negative for adenopathy. Does not bruise/bleed easily.  Psychiatric/Behavioral: Negative for suicidal ideas, behavioral problems, sleep disturbance, dysphoric mood and decreased concentration. The patient is not nervous/anxious.        Objective:   Physical Exam  Constitutional: She appears well-developed. No distress.  HENT:  Head: Normocephalic.  Right Ear: External ear normal.  Left Ear: External ear normal.  Nose: Nose normal.  Mouth/Throat: Oropharynx is clear and moist.  Eyes:  Conjunctivae are normal. Pupils are equal, round, and reactive to light. Right eye exhibits no discharge. Left eye exhibits no discharge.  Neck: Normal range of motion. Neck supple. No JVD present. No tracheal deviation present. No thyromegaly present.  Cardiovascular: Normal rate, regular rhythm and normal heart sounds.    Pulmonary/Chest: No stridor. No respiratory distress. She has no wheezes.  Abdominal: Soft. Bowel sounds are normal. She exhibits no distension and no mass. There is no tenderness. There is no rebound and no guarding.  Musculoskeletal: She exhibits no edema or tenderness.  Lymphadenopathy:    She has no cervical adenopathy.  Neurological: She displays normal reflexes. No cranial nerve deficit. She exhibits normal muscle tone. Coordination normal.  Skin: No rash noted. No erythema.  Psychiatric: She has a normal mood and affect. Her behavior is normal. Judgment and thought content normal.  No rash. No scalp tenderness. TMs WNL  Lab Results  Component Value Date   WBC 7.7 03/26/2014   HGB 14.9 03/26/2014   HCT 43.6 03/26/2014   PLT 342.0 03/26/2014   GLUCOSE 93 03/26/2014   CHOL 239* 03/26/2014   TRIG 347.0* 03/26/2014   HDL 31.60* 03/26/2014   LDLDIRECT 153.0 03/26/2014   ALT 20 03/26/2014   AST 23 03/26/2014   NA 139 03/26/2014   K 4.0 03/26/2014   CL 105 03/26/2014   CREATININE 0.9 03/26/2014   BUN 13 03/26/2014   CO2 28 03/26/2014   TSH 0.44 03/26/2014            Assessment & Plan:

## 2015-02-09 NOTE — Progress Notes (Signed)
Pre visit review using our clinic review tool, if applicable. No additional management support is needed unless otherwise documented below in the visit note. 

## 2015-02-09 NOTE — Assessment & Plan Note (Signed)
R/o Zoster pain vs other Acyclovir empiric Ibuprofen 400 mg tid prn

## 2015-02-16 ENCOUNTER — Other Ambulatory Visit: Payer: Self-pay | Admitting: Internal Medicine

## 2015-03-22 ENCOUNTER — Encounter: Payer: Self-pay | Admitting: Internal Medicine

## 2015-03-22 ENCOUNTER — Other Ambulatory Visit: Payer: Self-pay | Admitting: Internal Medicine

## 2015-03-22 DIAGNOSIS — G245 Blepharospasm: Secondary | ICD-10-CM

## 2015-03-23 ENCOUNTER — Encounter: Payer: Self-pay | Admitting: Neurology

## 2015-04-01 ENCOUNTER — Ambulatory Visit (INDEPENDENT_AMBULATORY_CARE_PROVIDER_SITE_OTHER): Payer: Medicare Other | Admitting: Neurology

## 2015-04-01 ENCOUNTER — Encounter: Payer: Self-pay | Admitting: Neurology

## 2015-04-01 VITALS — BP 110/80 | HR 97 | Ht 64.0 in | Wt 199.0 lb

## 2015-04-01 DIAGNOSIS — G244 Idiopathic orofacial dystonia: Secondary | ICD-10-CM | POA: Diagnosis not present

## 2015-04-01 DIAGNOSIS — G243 Spasmodic torticollis: Secondary | ICD-10-CM | POA: Diagnosis not present

## 2015-04-01 NOTE — Progress Notes (Signed)
Linda Hayes was seen today in neurologic consultation at the request of Walker Kehr, MD.  This patient is accompanied in the office by her spouse who supplements the history.  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  Previously been seen at the [facility] for the same.  I reviewed records from Dr. Algis Greenhouse.  The patient reports that the condition began in approximately  2007.  The pt states that she first went to [facility] and was dx with dry eye.  She was then referred to Dr. Doy Mince and was diagnosed with blepharospasm and she had botox type A.  This seemed to help.  She states that Dr. Doy Mince changed the injections to dysport not long after they started them and that helped as well.  Dr Doy Mince left for [facility] in 2011.  She started seeing Dr. Krista Blue and she did the dysport for about a year but the injections seemed to not be helping.   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 Doy Mince to [facility].  He tried the Dysport again at [facility] for about 6 months and it didn't help so she was referred to Dr. Heide Spark.   She has been seeing her since 2012.  The patient last received Dysport on 01/06/2015.  She states that it only lasts about 2 weeks.  She states that she has tried xeomin as well in 09/2011.  She receives approximately 100 units of Dysport to various areas around the eye as well as the platysma and bilateral SCM.  It appears that she was referred to Dr. Radford Pax for consideration of DBS but the patient did not want such an invasive procedure and did not go to the consultation.   Neuroimaging has previously been performed.  It is not available for my review today.  Pt reports that MRI previously done was normal    ALLERGIES:   Allergies  Allergen Reactions  . Ciprofloxacin     Caused colitis  . Erythromycin     Intolerance - GI  . Gabapentin     cough  . Hydrocodone  Nausea And Vomiting  . Penicillins   . Tramadol     n/v    CURRENT MEDICATIONS:  Outpatient Encounter Prescriptions as of 04/01/2015  Medication Sig  . ALPRAZolam (XANAX) 0.5 MG tablet Take 0.5 mg by mouth daily as needed.  . cholecalciferol (VITAMIN D) 1000 UNITS tablet Take 1,000 Units by mouth daily.    . fluticasone (FLONASE) 50 MCG/ACT nasal spray Place 2 sprays into both nostrils daily.  [name] ibuprofen (ADVIL,MOTRIN) 800 MG tablet Take 1 tablet (800 mg total) by mouth every 8 (eight) hours as needed.  . sertraline (ZOLOFT) 100 MG tablet Take 100 mg by mouth daily.    [name] SYNTHROID 125 MCG tablet TAKE 1 TABLET (125 MCG TOTAL) BY MOUTH DAILY.  [name] zolpidem (AMBIEN CR) 6.25 MG CR tablet Take 6.25 mg by mouth at bedtime.  . [DISCONTINUED] levothyroxine (SYNTHROID) 100 MCG tablet Take 125 mcg by mouth daily.  . [DISCONTINUED] acyclovir (ZOVIRAX) 800 MG tablet Take 1 tablet (800 mg total) by mouth 5 (five) times daily.  . [DISCONTINUED] SYNTHROID 125 MCG tablet TAKE 1 TABLET (125 MCG TOTAL) BY MOUTH DAILY.  . [DISCONTINUED] triamcinolone cream (KENALOG) 0.5 % Apply topically 3 (three) times daily. (Patient not taking: Reported on 02/09/2015)   No facility-administered encounter medications on file as of 04/01/2015.  PAST MEDICAL HISTORY:   Past Medical History  Diagnosis Date  . ANXIETY 03/17/2008  . ASTHMATIC BRONCHITIS, ACUTE 10/01/2008  . 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 DEPRESSION 03/17/2008  . GAIT ATAXIA 06/11/2009  . HYPOTHYROIDISM 03/17/2008  . INSOMNIA, PERSISTENT 03/17/2008  . OBESITY 03/17/2008  . Palpitations 10/01/2008  . SWEATING 03/17/2008  . Meige syndrome (blepharospasm with oromandibular dystonia)     PAST SURGICAL HISTORY:   Past Surgical History  Procedure Laterality Date  . Thyroidectomy  1999    SOCIAL HISTORY:   Social History   Social History  . Marital Status: Married    Spouse Name: N/A  . Number of Children: N/A  . Years of Education: N/A   Occupational History    . Not on file.   Social History Main Topics  . Smoking status: Never Smoker   . Smokeless tobacco: Not on file  . Alcohol Use: 0.0 oz/week    0 Standard drinks or equivalent per week     Comment: one every 6 months  . Drug Use: No  . Sexual Activity: Not on file   Other Topics Concern  . Not on file   Social History Narrative    FAMILY HISTORY:   Family Status  Relation Status Death Age  . Mother Deceased 48    lung failure  . Father Deceased 67    colitis  . Brother Alive     healthy  . Sister Alive     healthy  . Sister Alive     healthy  . Sister Alive     healthy  . Sister Alive     healthy  . Daughter Alive     hyporthyroidism  . Daughter Alive     hypothyroidism  . Daughter Alive     healthy    ROS:  Denies any swallowing difficulties.  A complete 10 system review of systems was obtained and was unremarkable apart from what is mentioned above.  PHYSICAL EXAMINATION:    VITALS:   Filed Vitals:   04/01/15 1330  BP: 110/80  Pulse: 97  Height: 5\' 4"  (1.626 m)  Weight: 199 lb (90.266 kg)    GEN:  Normal appears female in no acute distress.  Appears stated age. HEENT:  Normocephalic, atraumatic. The mucous membranes are moist. The superficial temporal arteries are without ropiness or tenderness. Cardiovascular: Regular rate and rhythm. Lungs: Clear to auscultation bilaterally. Neck/Heme: There are no carotid bruits noted bilaterally.  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  NEUROLOGICAL: Orientation:  The patient is alert and oriented x 3.  Fund of knowledge is appropriate.  Recent and remote memory intact.  Attention span and concentration normal.  Repeats and names without difficulty. Cranial nerves: There is good facial symmetry.  There is significant blepharospasm.  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  The pupils are equal round and reactive to light bilaterally. Fundoscopic exam is attempted, but  even with pulling the lids open, I cannot hold the eyes open long enough to examine the fundus. Extraocular muscles are intact.  Visual fields appear to be full, but she has difficulty participating with formal confrontational testing because of severe blepharospasm.  Speech is fluent and clear.  She has no difficulty with the guttural sounds.  Soft palate rises symmetrically and there is no tongue deviation. Hearing is intact to conversational tone.   Tone: Tone is good throughout. Sensation: Sensation is intact to light touch and pinprick throughout (facial, trunk, extremities).  Vibration is intact at the bilateral big toe. There is no extinction with double simultaneous stimulation. There is no sensory dermatomal level identified. Coordination:  The patient has no difficulty with RAM's or FNF bilaterally. Motor: Strength is 5/5 in the bilateral upper and lower extremities.  Shoulder shrug is equal and symmetric. There is no pronator drift.  There are no fasciculations noted. DTR's: Deep tendon reflexes are 2/4 at the bilateral biceps, triceps, brachioradialis, patella and achilles.  Plantar responses are downgoing bilaterally. Gait and Station: The patient is able to ambulate without difficulty.    IMPRESSION/PLAN  1. Severe meige syndrome with associated blepharospasm, oromandibular dystonia and cervical dystonia  -Long discussion with the patient today.  Greater than 50% of the 60 minute visit in counseling.  She has been receiving Botox in the form of Dysport at [facility] and her last injections were on 01/06/2015.  Unfortunately, the efficacy of these injections has been poor over the last 4 years and it is my suspicion that she has developed neutralizing antibodies.  Generally, the easiest way to test for these is to inject a small amount of Botox into the corrugator or frontalis muscles, but the patient has absolutely no wrinkles to test.  She was interested in seeing if we can find a lab that tests  formally for the neutralizing antibodies.  -I talked to her about Botox type B (Myobloc) as she has tried all of the Botox type A forms and would have neutralizing antibodies presumably to all forms.  However, I also told her that Botox type B injections burn significantly, and she has tolerated Botox type A injections fairly poorly according to her and those don't burn.  I talked to her in detail about Myobloc, and ultimately she decided that this would probably not be a good option.  -At one point, she was referred to Dr. Radford Pax for consideration of DBS for this condition.  She did not go for this consultation.  There are some studies that show that GPI DBS could be of benefit, but the patient really does not want this type of invasive procedure.  -She was offered myomectomy by her ophthalmologist at [facility] and I told her that this certainly could potentially help the holidays, but it is definitely not going to health oromandibular dystonia or cervical dystonia.  She understood that and is going to think about things.  I told her I would contact the movement disorder folks at [facility] and see if anything else is available besides for what I talked to her about and let her know if I heard anything different.  -Much greater than 50% of this visit was spent in counseling with the patient and the family.  Total face to face time:  45 min

## 2015-04-02 ENCOUNTER — Encounter: Payer: Self-pay | Admitting: Neurology

## 2015-04-02 ENCOUNTER — Telehealth: Payer: Self-pay | Admitting: Neurology

## 2015-04-02 NOTE — Telephone Encounter (Signed)
Received information from patient via Mychart. Reply sent.

## 2015-04-02 NOTE — Telephone Encounter (Signed)
Pt decided not to have the blood test done? Call back @ [phone]

## 2015-05-17 ENCOUNTER — Emergency Department
Admission: EM | Admit: 2015-05-17 | Discharge: 2015-05-17 | Disposition: A | Discharge status: Home / Self Care | Payer: Medicare Other | Attending: Emergency Medicine | Admitting: Emergency Medicine

## 2015-05-17 ENCOUNTER — Telehealth: Payer: Self-pay | Admitting: Internal Medicine

## 2015-05-17 ENCOUNTER — Encounter: Payer: Self-pay | Admitting: Emergency Medicine

## 2015-05-17 ENCOUNTER — Emergency Department: Payer: Medicare Other

## 2015-05-17 DIAGNOSIS — J45909 Unspecified asthma, uncomplicated: Secondary | ICD-10-CM | POA: Diagnosis not present

## 2015-05-17 DIAGNOSIS — Z880 Allergy status to penicillin: Secondary | ICD-10-CM | POA: Diagnosis not present

## 2015-05-17 DIAGNOSIS — Z79899 Other long term (current) drug therapy: Secondary | ICD-10-CM | POA: Diagnosis not present

## 2015-05-17 DIAGNOSIS — R0789 Other chest pain: Secondary | ICD-10-CM | POA: Diagnosis not present

## 2015-05-17 DIAGNOSIS — Z7951 Long term (current) use of inhaled steroids: Secondary | ICD-10-CM | POA: Diagnosis not present

## 2015-05-17 DIAGNOSIS — R079 Chest pain, unspecified: Secondary | ICD-10-CM | POA: Diagnosis not present

## 2015-05-17 LAB — BASIC METABOLIC PANEL
Anion gap: 8 (ref 5–15)
BUN: 15 mg/dL (ref 6–20)
CO2: 24 mmol/L (ref 22–32)
CREATININE: 0.85 mg/dL (ref 0.44–1.00)
Calcium: 8.9 mg/dL (ref 8.9–10.3)
Chloride: 104 mmol/L (ref 101–111)
GFR calc Af Amer: >60 mL/min (ref >60)
GLUCOSE: 104 mg/dL — AB (ref 65–99)
POTASSIUM: 3.7 mmol/L (ref 3.5–5.1)
SODIUM: 136 mmol/L (ref 135–145)

## 2015-05-17 LAB — CBC
HCT: 42.6 % (ref 35.0–47.0)
Hemoglobin: 14.6 g/dL (ref 12.0–16.0)
MCH: 31.9 pg (ref 26.0–34.0)
MCHC: 34.2 g/dL (ref 32.0–36.0)
MCV: 93.3 fL (ref 80.0–100.0)
PLATELETS: 334 10*3/uL (ref 150–440)
RBC: 4.56 MIL/uL (ref 3.80–5.20)
RDW: 13.4 % (ref 11.5–14.5)
WBC: 7.7 10*3/uL (ref 3.6–11.0)

## 2015-05-17 LAB — TROPONIN I

## 2015-05-17 NOTE — ED Notes (Signed)
Pt to ed with c/o chest pain that radiates down left arm and left breast.  Pt states pain is dull and achy, denies sob, denies weakness, denies diaphoresis and denies n/v.

## 2015-05-17 NOTE — Discharge Instructions (Signed)

## 2015-05-17 NOTE — Telephone Encounter (Signed)
Per dr Alain Marion, patient can be worked in today at either 12:45 or 1:00---i tried to call patient, left voicemail advising patient to call us back quickly if she still wants to be seen today by dr plotnikov--if patient calls back, please schedule patient at either 12:45 or 1:00 with dr Alain Marion

## 2015-05-17 NOTE — ED Provider Notes (Signed)
[facility] Emergency Department Provider Note  ____________________________________________  Time seen: 1440 p.m.  I have reviewed the triage vital signs and the nursing notes.   HISTORY  Chief Complaint Chest Pain     HPI Linda Hayes is a 64 y.o. female who complains of chest pain in her left chest. This is been present for approximately 3 or 4 days. The female companion with her reports she's been trying to get her to come to the hospital for the past 3 days. The patient reports this discomfort is worse when she moves, especially her left arm. She feels she has some discomfort in the left shoulder and left neck as well. She denies any shortness of breath, nausea, or diaphoresis. She has a chronic neurologic condition which causes a tremor which is notable. She denies any cardiac history.    Past Medical History  Diagnosis Date  . ANXIETY 03/17/2008  . ASTHMATIC BRONCHITIS, ACUTE 10/01/2008  . 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 DEPRESSION 03/17/2008  . GAIT ATAXIA 06/11/2009  . HYPOTHYROIDISM 03/17/2008  . INSOMNIA, PERSISTENT 03/17/2008  . OBESITY 03/17/2008  . Palpitations 10/01/2008  . SWEATING 03/17/2008  . Meige syndrome (blepharospasm with oromandibular dystonia)     Patient Active Problem List   Diagnosis Date Noted  . Earache on right 02/09/2015  . Well adult exam 03/31/2014  . Dyslipidemia 03/31/2014  . Cough 12/17/2013  . Post herpetic neuralgia 12/17/2013  . Gastroenteritis, acute 03/13/2013  . Anemia 03/13/2013  . Acute sinusitis 11/12/2012  . Rash of back 05/27/2012  . Herpes zoster 05/27/2012  . Left LBP 05/27/2012  . Acute sinus infection 05/01/2011  . 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 GAIT ATAXIA 06/11/2009  . Palpitations 10/01/2008  . HYPOTHYROIDISM 03/17/2008  . OBESITY 03/17/2008  . ANXIETY 03/17/2008  . INSOMNIA, PERSISTENT 03/17/2008  . DEPRESSION 03/17/2008  . SWEATING 03/17/2008    Past Surgical History  Procedure Laterality Date   . Thyroidectomy  1999    Current Outpatient Rx  Name  Route  Sig  Dispense  Refill  . ALPRAZolam (XANAX) 0.5 MG tablet   Oral   Take 0.5 mg by mouth daily as needed.         . cholecalciferol (VITAMIN D) 1000 UNITS tablet   Oral   Take 1,000 Units by mouth daily.           [name] ibuprofen (ADVIL,MOTRIN) 800 MG tablet   Oral   Take 1 tablet (800 mg total) by mouth every 8 (eight) hours as needed.   60 tablet   0   . sertraline (ZOLOFT) 100 MG tablet   Oral   Take 150 mg by mouth daily.          [name] SYNTHROID 125 MCG tablet      TAKE 1 TABLET (125 MCG TOTAL) BY MOUTH DAILY.      5     Dispense as written.   . zolpidem (AMBIEN CR) 6.25 MG CR tablet   Oral   Take 6.25 mg by mouth at bedtime.      5   . fluticasone (FLONASE) 50 MCG/ACT nasal spray   Each Nare   Place 2 sprays into both nostrils daily.   16 g   6     Allergies Ciprofloxacin; Erythromycin; Gabapentin; Hydrocodone; Penicillins; and Tramadol  Family History  Problem Relation Age of Onset  . Hypertension Other   . Depression Other   . Diabetes Other     Social History Social History  Substance Use Topics  . Smoking status: Never Smoker   . Smokeless tobacco: None  . Alcohol Use: 0.0 oz/week    0 Standard drinks or equivalent per week     Comment: one every 6 months    Review of Systems  Constitutional: Negative for fever. ENT: Negative for sore throat. Cardiovascular: Positive for chest pain. Respiratory: Negative for shortness of breath. Gastrointestinal: Negative for abdominal pain, vomiting and diarrhea. Genitourinary: Negative for dysuria. Musculoskeletal: No myalgias or injuries. Skin: Negative for rash. Neurological: Negative for headaches   10-point ROS otherwise negative.  ____________________________________________   PHYSICAL EXAM:  VITAL SIGNS: ED Triage Vitals  Enc Vitals Group     BP 05/17/15 1106 133/94 mmHg     Pulse Rate 05/17/15 1106 89     Resp  05/17/15 1106 20     Temp 05/17/15 1106 97.7 F (36.5 C)     Temp Source 05/17/15 1106 Oral     SpO2 05/17/15 1106 97 %     Weight 05/17/15 1106 190 lb (86.183 kg)     Height 05/17/15 1106 5\' 4"  (1.626 m)     Head Cir --      Peak Flow --      Pain Score 05/17/15 1106 4     Pain Loc --      Pain Edu? --      Excl. in [place]? --     Constitutional:  Alert and oriented. Well appearing and in no distress, though the patient has a notable tremor.[name] ENT   Head: Normocephalic and atraumatic. Cardiovascular: Normal rate, regular rhythm, no murmur noted Chest wall: Notable tenderness to the left chest and left pectoralis. This extends into the upper left arm area Respiratory:  Normal respiratory effort, no tachypnea.    Breath sounds are clear and equal bilaterally.  Gastrointestinal: Soft and nontender. No distention.  Back: No muscle spasm, no tenderness, no CVA tenderness. Musculoskeletal: No deformity noted. Nontender with normal range of motion in all extremities.  No noted edema. Neurologic:  Normal speech and language. Notable tremor. Skin:  Skin is warm, dry. No rash noted. Psychiatric: Mood and affect are normal. Speech and behavior are normal.  ____________________________________________    LABS (pertinent positives/negatives)  Labs Reviewed  BASIC METABOLIC PANEL - Abnormal; Notable for the following:    Glucose, Bld 104 (*)    All other components within normal limits  CBC  TROPONIN I     ____________________________________________   EKG  ED ECG REPORT I, Annelle Behrendt W, the attending physician, personally viewed and interpreted this ECG.   Date: 05/17/2015  EKG Time: 1102  Rate: 90  Rhythm: Normal sinus rhythm  Axis: Normal  Intervals: Normal  ST&T Change: Flat T in 3 and aVF    ____________________________________________    RADIOLOGY  Chest x-ray: No acute changes  ____________________________________________   INITIAL IMPRESSION /  ASSESSMENT AND PLAN / ED COURSE  Pertinent labs & imaging results that were available during my care of the patient were reviewed by me and considered in my medical decision making (see chart for details).   Pleasant 64 year old female with 3 days of left-sided chest discomfort. She has not had any nausea, shortness of breath, diaphoresis. She does not complain of any fatigue. The patient has tenderness in the left chest. She and I agreed this is most likely musculoskeletal. Blood tests are currently pending.  ----------------------------------------- 3:38 PM on 05/17/2015 -----------------------------------------  Blood test have returned with a negative troponin, a normal  CBC and metabolic panel.  The patient continues to be comfortable. We have discussed follow-up with her primary physician and consideration for a stress test. The patient reports she would prefer not have a stress test and she does not believe that this discomfort is from her heart. She agrees with my primary suspicion that this is musculoskeletal. ____________________________________________   FINAL CLINICAL IMPRESSION(S) / ED DIAGNOSES  Final diagnoses:  Chest wall pain      Ahmed Prima, MD 05/17/15 1540

## 2015-05-17 NOTE — Telephone Encounter (Signed)
Patient Name: Linda Hayes  DOB: 17-Oct-1950    Initial Comment caller states she has been having chest pain that radiates down into her left arm   Nurse Assessment  Nurse: Verlin Fester RN, Stanton Kidney Date/Time Eilene Ghazi Time): 05/17/2015 9:33:37 AM  Confirm and document reason for call. If symptomatic, describe symptoms. ---Patient states she is having chest pain that is going down her left arm for about 3 days.  Has the patient traveled out of the country within the last 30 days? ---No  Does the patient require triage? ---Yes  Related visit to physician within the last 2 weeks? ---No  Does the PT have any chronic conditions? (i.e. diabetes, asthma, etc.) ---Yes  List chronic conditions. ---"neurological condition"     Guidelines    Guideline Title Affirmed Question Affirmed Notes  Chest Pain [1] Chest pain lasts > 5 minutes AND [2] age > 33    Final Disposition User   Call EMS 911 Now Verlin Fester, RN, Stanton Kidney    Comments  After triage patient states she will not call 911 and she doesn't want to go to ED , states she wants to see her doctor for this   Disagree/Comply: Disagree  Disagree/Comply Reason: Disagree with instructions

## 2015-05-20 DIAGNOSIS — G245 Blepharospasm: Secondary | ICD-10-CM | POA: Diagnosis not present

## 2015-05-24 ENCOUNTER — Encounter: Payer: Self-pay | Admitting: Internal Medicine

## 2015-05-26 ENCOUNTER — Ambulatory Visit: Payer: Medicare Other | Admitting: Internal Medicine

## 2015-06-04 ENCOUNTER — Ambulatory Visit: Payer: Medicare Other | Admitting: Family

## 2015-06-08 DIAGNOSIS — Z23 Encounter for immunization: Secondary | ICD-10-CM | POA: Diagnosis not present

## 2015-06-21 ENCOUNTER — Encounter: Payer: Self-pay | Admitting: Internal Medicine

## 2015-06-22 ENCOUNTER — Other Ambulatory Visit: Payer: Self-pay | Admitting: Internal Medicine

## 2015-06-22 DIAGNOSIS — Z0000 Encounter for general adult medical examination without abnormal findings: Secondary | ICD-10-CM

## 2015-06-22 DIAGNOSIS — F959 Tic disorder, unspecified: Secondary | ICD-10-CM

## 2015-06-22 DIAGNOSIS — D509 Iron deficiency anemia, unspecified: Secondary | ICD-10-CM

## 2015-06-22 DIAGNOSIS — E785 Hyperlipidemia, unspecified: Secondary | ICD-10-CM

## 2015-06-24 ENCOUNTER — Other Ambulatory Visit (INDEPENDENT_AMBULATORY_CARE_PROVIDER_SITE_OTHER): Payer: Medicare Other

## 2015-06-24 DIAGNOSIS — Z0000 Encounter for general adult medical examination without abnormal findings: Secondary | ICD-10-CM

## 2015-06-24 DIAGNOSIS — E785 Hyperlipidemia, unspecified: Secondary | ICD-10-CM

## 2015-06-24 DIAGNOSIS — D509 Iron deficiency anemia, unspecified: Secondary | ICD-10-CM | POA: Diagnosis not present

## 2015-06-24 DIAGNOSIS — F959 Tic disorder, unspecified: Secondary | ICD-10-CM

## 2015-06-24 LAB — URINALYSIS, ROUTINE W REFLEX MICROSCOPIC
Bilirubin Urine: NEGATIVE
Ketones, ur: NEGATIVE
LEUKOCYTES UA: NEGATIVE
Nitrite: POSITIVE — AB
PH: 6 (ref 5.0–8.0)
TOTAL PROTEIN, URINE-UPE24: NEGATIVE
UROBILINOGEN UA: 0.2 (ref 0.0–1.0)
Urine Glucose: NEGATIVE

## 2015-06-24 LAB — HEPATIC FUNCTION PANEL
ALBUMIN: 4 g/dL (ref 3.5–5.2)
ALT: 16 U/L (ref 0–35)
AST: 18 U/L (ref 0–37)
Alkaline Phosphatase: 94 U/L (ref 39–117)
Bilirubin, Direct: 0.1 mg/dL (ref 0.0–0.3)
TOTAL PROTEIN: 7.2 g/dL (ref 6.0–8.3)
Total Bilirubin: 0.4 mg/dL (ref 0.2–1.2)

## 2015-06-24 LAB — CBC WITH DIFFERENTIAL/PLATELET
BASOS PCT: 0.9 % (ref 0.0–3.0)
Basophils Absolute: 0.1 10*3/uL (ref 0.0–0.1)
EOS ABS: 0.2 10*3/uL (ref 0.0–0.7)
Eosinophils Relative: 2.9 % (ref 0.0–5.0)
HCT: 43.2 % (ref 36.0–46.0)
HEMOGLOBIN: 14.6 g/dL (ref 12.0–15.0)
LYMPHS ABS: 3.3 10*3/uL (ref 0.7–4.0)
LYMPHS PCT: 39 % (ref 12.0–46.0)
MCHC: 33.8 g/dL (ref 30.0–36.0)
MCV: 94.5 fl (ref 78.0–100.0)
MONO ABS: 0.5 10*3/uL (ref 0.1–1.0)
Monocytes Relative: 6.5 % (ref 3.0–12.0)
NEUTROS PCT: 50.7 % (ref 43.0–77.0)
Neutro Abs: 4.3 10*3/uL (ref 1.4–7.7)
Platelets: 315 10*3/uL (ref 150.0–400.0)
RBC: 4.57 Mil/uL (ref 3.87–5.11)
RDW: 13 % (ref 11.5–15.5)
WBC: 8.4 10*3/uL (ref 4.0–10.5)

## 2015-06-24 LAB — BASIC METABOLIC PANEL
BUN: 16 mg/dL (ref 6–23)
CALCIUM: 9.2 mg/dL (ref 8.4–10.5)
CO2: 26 meq/L (ref 19–32)
CREATININE: 0.88 mg/dL (ref 0.40–1.20)
Chloride: 107 mEq/L (ref 96–112)
GFR: 68.91 mL/min (ref >60.00)
GLUCOSE: 94 mg/dL (ref 70–99)
Potassium: 3.8 mEq/L (ref 3.5–5.1)
Sodium: 142 mEq/L (ref 135–145)

## 2015-06-24 LAB — LIPID PANEL
CHOL/HDL RATIO: 6
CHOLESTEROL: 198 mg/dL (ref 0–200)
HDL: 31.2 mg/dL — ABNORMAL LOW (ref >39.00)
NonHDL: 167.05
TRIGLYCERIDES: 201 mg/dL — AB (ref 0.0–149.0)
VLDL: 40.2 mg/dL — ABNORMAL HIGH (ref 0.0–40.0)

## 2015-06-24 LAB — TSH: TSH: 0.2 u[IU]/mL — ABNORMAL LOW (ref 0.35–4.50)

## 2015-06-24 LAB — IBC PANEL
IRON: 57 ug/dL (ref 42–145)
Saturation Ratios: 20.4 % (ref 20.0–50.0)
TRANSFERRIN: 200 mg/dL — AB (ref 212.0–360.0)

## 2015-06-24 LAB — LDL CHOLESTEROL, DIRECT: Direct LDL: 123 mg/dL

## 2015-06-28 ENCOUNTER — Encounter: Payer: Self-pay | Admitting: Internal Medicine

## 2015-06-28 ENCOUNTER — Ambulatory Visit (INDEPENDENT_AMBULATORY_CARE_PROVIDER_SITE_OTHER): Payer: Medicare Other | Admitting: Internal Medicine

## 2015-06-28 VITALS — BP 120/90 | HR 92 | Ht 64.0 in | Wt 195.0 lb

## 2015-06-28 DIAGNOSIS — R0989 Other specified symptoms and signs involving the circulatory and respiratory systems: Secondary | ICD-10-CM

## 2015-06-28 DIAGNOSIS — F4323 Adjustment disorder with mixed anxiety and depressed mood: Secondary | ICD-10-CM | POA: Diagnosis not present

## 2015-06-28 DIAGNOSIS — E890 Postprocedural hypothyroidism: Secondary | ICD-10-CM | POA: Diagnosis not present

## 2015-06-28 DIAGNOSIS — G245 Blepharospasm: Secondary | ICD-10-CM

## 2015-06-28 DIAGNOSIS — Z0000 Encounter for general adult medical examination without abnormal findings: Secondary | ICD-10-CM

## 2015-06-28 NOTE — Assessment & Plan Note (Signed)
Dr Caprice Beaver Zoloft Xanax prn  Potential benefits of a long term benzodiazepines  use as well as potential risks  and complications were explained to the patient and were aknowledged.

## 2015-06-28 NOTE — Assessment & Plan Note (Signed)
Here for medicare wellness/physical  Diet: heart healthy  Physical activity: not sedentary  Depression/mood screen: negative  Hearing: intact to whispered voice  Visual acuity: grossly normal, performs annual eye exam  ADLs: capable  Fall risk: low  Home safety: good  Cognitive evaluation: intact to orientation, naming, recall and repetition  EOL planning: adv directives, full code/ I agree  I have personally reviewed and have noted  1. The patient's medical and social history  2. Their use of alcohol, tobacco or illicit drugs  3. Their current medications and supplements  4. The patient's functional ability including ADL's, fall risks, home safety risks and hearing or visual impairment.  5. Diet and physical activities  6. Evidence for depression or mood disorders 7. Roster of providers reviewed    Today patient counseled on age appropriate routine health concerns for screening and prevention, each reviewed and up to date or declined. Immunizations reviewed and up to date or declined. Labs ordered and reviewed. Risk factors for depression reviewed and negative. Hearing function and visual acuity are intact. ADLs screened and addressed as needed. Functional ability and level of safety reviewed and appropriate. Education, counseling and referrals performed based on assessed risks today. Patient provided with a copy of personalized plan for preventive services.

## 2015-06-28 NOTE — Progress Notes (Signed)
Subjective:  Patient ID: Linda Hayes, female    DOB: 06/26/1951  Age: 64 y.o. MRN: [id number]  CC: No chief complaint on file.   HPI Linda Hayes presents for a well exam. C/o: CP x 1 mo continuous w/o irradiation - s/p ER visit in 10/16 (reviewed)  Outpatient Prescriptions Prior to Visit  Medication Sig Dispense Refill  . ALPRAZolam (XANAX) 0.5 MG tablet Take 0.5 mg by mouth daily as needed.    . cholecalciferol (VITAMIN D) 1000 UNITS tablet Take 1,000 Units by mouth daily.      [name] ibuprofen (ADVIL,MOTRIN) 800 MG tablet Take 1 tablet (800 mg total) by mouth every 8 (eight) hours as needed. 60 tablet 0  . sertraline (ZOLOFT) 100 MG tablet Take 150 mg by mouth daily.     [name] SYNTHROID 125 MCG tablet TAKE 1 TABLET (125 MCG TOTAL) BY MOUTH DAILY.  5  . zolpidem (AMBIEN CR) 6.25 MG CR tablet Take 6.25 mg by mouth at bedtime.  5  . fluticasone (FLONASE) 50 MCG/ACT nasal spray Place 2 sprays into both nostrils daily. (Patient not taking: Reported on 06/28/2015) 16 g 6   No facility-administered medications prior to visit.    ROS Review of Systems  Constitutional: Negative for chills, activity change, appetite change, fatigue and unexpected weight change.  HENT: Negative for congestion, mouth sores and sinus pressure.   Eyes: Negative for visual disturbance.  Respiratory: Negative for cough and chest tightness.   Cardiovascular: Positive for chest pain.  Gastrointestinal: Negative for nausea and abdominal pain.  Genitourinary: Negative for frequency, difficulty urinating and vaginal pain.  Musculoskeletal: Negative for back pain and gait problem.  Skin: Negative for pallor and rash.  Neurological: Negative for dizziness, tremors, weakness, numbness and headaches.  Psychiatric/Behavioral: Negative for suicidal ideas, confusion and sleep disturbance.    Objective:  BP 120/90 mmHg  Pulse 92  Ht 5\' 4"  (1.626 m)  Wt 195 lb (88.451 kg)  BMI 33.46 kg/m2  SpO2 93%  BP Readings from  Last 3 Encounters:  06/28/15 120/90  05/17/15 116/84  04/01/15 110/80    Wt Readings from Last 3 Encounters:  06/28/15 195 lb (88.451 kg)  05/17/15 190 lb (86.183 kg)  04/01/15 199 lb (90.266 kg)    Physical Exam  Constitutional: She appears well-developed. No distress.  HENT:  Head: Normocephalic.  Right Ear: External ear normal.  Left Ear: External ear normal.  Nose: Nose normal.  Mouth/Throat: Oropharynx is clear and moist.  Eyes: Conjunctivae are normal. Pupils are equal, round, and reactive to light. Right eye exhibits no discharge. Left eye exhibits no discharge.  Neck: Normal range of motion. Neck supple. No JVD present. No tracheal deviation present. No thyromegaly present.  Cardiovascular: Normal rate, regular rhythm and normal heart sounds.   Pulmonary/Chest: No stridor. No respiratory distress. She has no wheezes. She exhibits tenderness.  Abdominal: Soft. Bowel sounds are normal. She exhibits no distension and no mass. There is no tenderness. There is no rebound and no guarding.  Musculoskeletal: She exhibits no edema or tenderness.  Lymphadenopathy:    She has no cervical adenopathy.  Neurological: She displays normal reflexes. No cranial nerve deficit. She exhibits normal muscle tone. Coordination normal.  Skin: No rash noted. No erythema.  Psychiatric: She has a normal mood and affect. Her behavior is normal. Judgment and thought content normal.  dist sternum is tender - pain is reproducible Tremors and spasms present  Lab Results  Component Value Date  WBC 8.4 06/24/2015   HGB 14.6 06/24/2015   HCT 43.2 06/24/2015   PLT 315.0 06/24/2015   GLUCOSE 94 06/24/2015   CHOL 198 06/24/2015   TRIG 201.0* 06/24/2015   HDL 31.20* 06/24/2015   LDLDIRECT 123.0 06/24/2015   ALT 16 06/24/2015   AST 18 06/24/2015   NA 142 06/24/2015   K 3.8 06/24/2015   CL 107 06/24/2015   CREATININE 0.88 06/24/2015   BUN 16 06/24/2015   CO2 26 06/24/2015   TSH 0.20* 06/24/2015      Dg Chest 2 View  05/17/2015  CLINICAL DATA:  Chest pain for 4 days, LEFT-sided in radiating down LEFT arm, history asthma EXAM: CHEST  2 VIEW COMPARISON:  03/17/2005 FINDINGS: Upper normal heart size. Tortuous aorta. Mediastinal contours and pulmonary vascularity normal. Lungs clear. No pleural effusion or pneumothorax. Osseous structures unremarkable. Surgical clips in the cervical region bilaterally likely reflect prior thyroidectomy. IMPRESSION: No acute abnormalities pre Electronically Signed   By: Lavonia Dana M.D.   On: 05/17/2015 14:30    Assessment & Plan:   Diagnoses and all orders for this visit:  Well adult exam  Chest wall symptom  Postoperative hypothyroidism -     Lipid panel; Future -     TSH; Future -     T4, free; Future  Adjustment disorder with mixed anxiety and depressed mood  Blepharospasm   I am having Ms. Willcut maintain her cholecalciferol, sertraline, ibuprofen, fluticasone, ALPRAZolam, zolpidem, and SYNTHROID.  No orders of the defined types were placed in this encounter.     Follow-up: Return in about 3 months (around 09/28/2015) for a follow-up visit.  Walker Kehr, MD

## 2015-06-28 NOTE — Assessment & Plan Note (Addendum)
2001 Post-op s/p thyroid Ca treatmentChronic  XRT On Levothroid

## 2015-06-28 NOTE — Assessment & Plan Note (Addendum)
11/16 distal costochondritis Ibuprofen and Nexium prn CL stress test suggested

## 2015-06-28 NOTE — Patient Instructions (Addendum)
Preventive Care for Adults, Female A healthy lifestyle and preventive care can promote health and wellness. Preventive health guidelines for women include the following key practices.  A routine yearly physical is a good way to check with your health care provider about your health and preventive screening. It is a chance to share any concerns and updates on your health and to receive a thorough exam.  Visit your dentist for a routine exam and preventive care every 6 months. Brush your teeth twice a day and floss once a day. Good oral hygiene prevents tooth decay and gum disease.  The frequency of eye exams is based on your age, health, family medical history, use of contact lenses, and other factors. Follow your health care provider's recommendations for frequency of eye exams.  Eat a healthy diet. Foods like vegetables, fruits, whole grains, low-fat dairy products, and lean protein foods contain the nutrients you need without too many calories. Decrease your intake of foods high in solid fats, added sugars, and salt. Eat the right amount of calories for you.Get information about a proper diet from your health care provider, if necessary.  Regular physical exercise is one of the most important things you can do for your health. Most adults should get at least 150 minutes of moderate-intensity exercise (any activity that increases your heart rate and causes you to sweat) each week. In addition, most adults need muscle-strengthening exercises on 2 or more days a week.  Maintain a healthy weight. The body mass index (BMI) is a screening tool to identify possible weight problems. It provides an estimate of body fat based on height and weight. Your health care provider can find your BMI and can help you achieve or maintain a healthy weight.For adults 20 years and older:  A BMI below 18.5 is considered underweight.  A BMI of 18.5 to 24.9 is normal.  A BMI of 25 to 29.9 is considered overweight.  A  BMI of 30 and above is considered obese.  Maintain normal blood lipids and cholesterol levels by exercising and minimizing your intake of saturated fat. Eat a balanced diet with plenty of fruit and vegetables. Blood tests for lipids and cholesterol should begin at age 45 and be repeated every 5 years. If your lipid or cholesterol levels are high, you are over 50, or you are at high risk for heart disease, you may need your cholesterol levels checked more frequently.Ongoing high lipid and cholesterol levels should be treated with medicines if diet and exercise are not working.  If you smoke, find out from your health care provider how to quit. If you do not use tobacco, do not start.  Lung cancer screening is recommended for adults aged 45-80 years who are at high risk for developing lung cancer because of a history of smoking. A yearly low-dose CT scan of the lungs is recommended for people who have at least a 30-pack-year history of smoking and are a current smoker or have quit within the past 15 years. A pack year of smoking is smoking an average of 1 pack of cigarettes a day for 1 year (for example: 1 pack a day for 30 years or 2 packs a day for 15 years). Yearly screening should continue until the smoker has stopped smoking for at least 15 years. Yearly screening should be stopped for people who develop a health problem that would prevent them from having lung cancer treatment.  If you are pregnant, do not drink alcohol. If you are  breastfeeding, be very cautious about drinking alcohol. If you are not pregnant and choose to drink alcohol, do not have more than 1 drink per day. One drink is considered to be 12 ounces (355 mL) of beer, 5 ounces (148 mL) of wine, or 1.5 ounces (44 mL) of liquor.  Avoid use of street drugs. Do not share needles with anyone. Ask for help if you need support or instructions about stopping the use of drugs.  High blood pressure causes heart disease and increases the risk  of stroke. Your blood pressure should be checked at least every 1 to 2 years. Ongoing high blood pressure should be treated with medicines if weight loss and exercise do not work.  If you are 55-79 years old, ask your health care provider if you should take aspirin to prevent strokes.  Diabetes screening is done by taking a blood sample to check your blood glucose level after you have not eaten for a certain period of time (fasting). If you are not overweight and you do not have risk factors for diabetes, you should be screened once every 3 years starting at age 45. If you are overweight or obese and you are 40-70 years of age, you should be screened for diabetes every year as part of your cardiovascular risk assessment.  Breast cancer screening is essential preventive care for women. You should practice "breast self-awareness." This means understanding the normal appearance and feel of your breasts and may include breast self-examination. Any changes detected, no matter how small, should be reported to a health care provider. Women in their 20s and 30s should have a clinical breast exam (CBE) by a health care provider as part of a regular health exam every 1 to 3 years. After age 40, women should have a CBE every year. Starting at age 40, women should consider having a mammogram (breast X-ray test) every year. Women who have a family history of breast cancer should talk to their health care provider about genetic screening. Women at a high risk of breast cancer should talk to their health care providers about having an MRI and a mammogram every year.  Breast cancer gene (BRCA)-related cancer risk assessment is recommended for women who have family members with BRCA-related cancers. BRCA-related cancers include breast, ovarian, tubal, and peritoneal cancers. Having family members with these cancers may be associated with an increased risk for harmful changes (mutations) in the breast cancer genes BRCA1 and  BRCA2. Results of the assessment will determine the need for genetic counseling and BRCA1 and BRCA2 testing.  Your health care provider may recommend that you be screened regularly for cancer of the pelvic organs (ovaries, uterus, and vagina). This screening involves a pelvic examination, including checking for microscopic changes to the surface of your cervix (Pap test). You may be encouraged to have this screening done every 3 years, beginning at age 21.  For women ages 30-65, health care providers may recommend pelvic exams and Pap testing every 3 years, or they may recommend the Pap and pelvic exam, combined with testing for human papilloma virus (HPV), every 5 years. Some types of HPV increase your risk of cervical cancer. Testing for HPV may also be done on women of any age with unclear Pap test results.  Other health care providers may not recommend any screening for nonpregnant women who are considered low risk for pelvic cancer and who do not have symptoms. Ask your health care provider if a screening pelvic exam is right for   you.  If you have had past treatment for cervical cancer or a condition that could lead to cancer, you need Pap tests and screening for cancer for at least 20 years after your treatment. If Pap tests have been discontinued, your risk factors (such as having a new sexual partner) need to be reassessed to determine if screening should resume. Some women have medical problems that increase the chance of getting cervical cancer. In these cases, your health care provider may recommend more frequent screening and Pap tests.  Colorectal cancer can be detected and often prevented. Most routine colorectal cancer screening begins at the age of 50 years and continues through age 75 years. However, your health care provider may recommend screening at an earlier age if you have risk factors for colon cancer. On a yearly basis, your health care provider may provide home test kits to check  for hidden blood in the stool. Use of a small camera at the end of a tube, to directly examine the colon (sigmoidoscopy or colonoscopy), can detect the earliest forms of colorectal cancer. Talk to your health care provider about this at age 50, when routine screening begins. Direct exam of the colon should be repeated every 5-10 years through age 75 years, unless early forms of precancerous polyps or small growths are found.  People who are at an increased risk for hepatitis B should be screened for this virus. You are considered at high risk for hepatitis B if:  You were born in a country where hepatitis B occurs often. Talk with your health care provider about which countries are considered high risk.  Your parents were born in a high-risk country and you have not received a shot to protect against hepatitis B (hepatitis B vaccine).  You have HIV or AIDS.  You use needles to inject street drugs.  You live with, or have sex with, someone who has hepatitis B.  You get hemodialysis treatment.  You take certain medicines for conditions like cancer, organ transplantation, and autoimmune conditions.  Hepatitis C blood testing is recommended for all people born from 1945 through 1965 and any individual with known risks for hepatitis C.  Practice safe sex. Use condoms and avoid high-risk sexual practices to reduce the spread of sexually transmitted infections (STIs). STIs include gonorrhea, chlamydia, syphilis, trichomonas, herpes, HPV, and human immunodeficiency virus (HIV). Herpes, HIV, and HPV are viral illnesses that have no cure. They can result in disability, cancer, and death.  You should be screened for sexually transmitted illnesses (STIs) including gonorrhea and chlamydia if:  You are sexually active and are younger than 24 years.  You are older than 24 years and your health care provider tells you that you are at risk for this type of infection.  Your sexual activity has changed  since you were last screened and you are at an increased risk for chlamydia or gonorrhea. Ask your health care provider if you are at risk.  If you are at risk of being infected with HIV, it is recommended that you take a prescription medicine daily to prevent HIV infection. This is called preexposure prophylaxis (PrEP). You are considered at risk if:  You are sexually active and do not regularly use condoms or know the HIV status of your partner(s).  You take drugs by injection.  You are sexually active with a partner who has HIV.  Talk with your health care provider about whether you are at high risk of being infected with HIV. If   you choose to begin PrEP, you should first be tested for HIV. You should then be tested every 3 months for as long as you are taking PrEP.  Osteoporosis is a disease in which the bones lose minerals and strength with aging. This can result in serious bone fractures or breaks. The risk of osteoporosis can be identified using a bone density scan. Women ages 67 years and over and women at risk for fractures or osteoporosis should discuss screening with their health care providers. Ask your health care provider whether you should take a calcium supplement or vitamin D to reduce the rate of osteoporosis.  Menopause can be associated with physical symptoms and risks. Hormone replacement therapy is available to decrease symptoms and risks. You should talk to your health care provider about whether hormone replacement therapy is right for you.  Use sunscreen. Apply sunscreen liberally and repeatedly throughout the day. You should seek shade when your shadow is shorter than you. Protect yourself by wearing long sleeves, pants, a wide-brimmed hat, and sunglasses year round, whenever you are outdoors.  Once a month, do a whole body skin exam, using a mirror to look at the skin on your back. Tell your health care provider of new moles, moles that have irregular borders, moles that  are larger than a pencil eraser, or moles that have changed in shape or color.  Stay current with required vaccines (immunizations).  Influenza vaccine. All adults should be immunized every year.  Tetanus, diphtheria, and acellular pertussis (Td, Tdap) vaccine. Pregnant women should receive 1 dose of Tdap vaccine during each pregnancy. The dose should be obtained regardless of the length of time since the last dose. Immunization is preferred during the 27th-36th week of gestation. An adult who has not previously received Tdap or who does not know her vaccine status should receive 1 dose of Tdap. This initial dose should be followed by tetanus and diphtheria toxoids (Td) booster doses every 10 years. Adults with an unknown or incomplete history of completing a 3-dose immunization series with Td-containing vaccines should begin or complete a primary immunization series including a Tdap dose. Adults should receive a Td booster every 10 years.  Varicella vaccine. An adult without evidence of immunity to varicella should receive 2 doses or a second dose if she has previously received 1 dose. Pregnant females who do not have evidence of immunity should receive the first dose after pregnancy. This first dose should be obtained before leaving the health care facility. The second dose should be obtained 4-8 weeks after the first dose.  Human papillomavirus (HPV) vaccine. Females aged 13-26 years who have not received the vaccine previously should obtain the 3-dose series. The vaccine is not recommended for use in pregnant females. However, pregnancy testing is not needed before receiving a dose. If a female is found to be pregnant after receiving a dose, no treatment is needed. In that case, the remaining doses should be delayed until after the pregnancy. Immunization is recommended for any person with an immunocompromised condition through the age of 61 years if she did not get any or all doses earlier. During the  3-dose series, the second dose should be obtained 4-8 weeks after the first dose. The third dose should be obtained 24 weeks after the first dose and 16 weeks after the second dose.  Zoster vaccine. One dose is recommended for adults aged 30 years or older unless certain conditions are present.  Measles, mumps, and rubella (MMR) vaccine. Adults born  before 1957 generally are considered immune to measles and mumps. Adults born in 1957 or later should have 1 or more doses of MMR vaccine unless there is a contraindication to the vaccine or there is laboratory evidence of immunity to each of the three diseases. A routine second dose of MMR vaccine should be obtained at least 28 days after the first dose for students attending postsecondary schools, health care workers, or international travelers. People who received inactivated measles vaccine or an unknown type of measles vaccine during 1963-1967 should receive 2 doses of MMR vaccine. People who received inactivated mumps vaccine or an unknown type of mumps vaccine before 1979 and are at high risk for mumps infection should consider immunization with 2 doses of MMR vaccine. For females of childbearing age, rubella immunity should be determined. If there is no evidence of immunity, females who are not pregnant should be vaccinated. If there is no evidence of immunity, females who are pregnant should delay immunization until after pregnancy. Unvaccinated health care workers born before 1957 who lack laboratory evidence of measles, mumps, or rubella immunity or laboratory confirmation of disease should consider measles and mumps immunization with 2 doses of MMR vaccine or rubella immunization with 1 dose of MMR vaccine.  Pneumococcal 13-valent conjugate (PCV13) vaccine. When indicated, a person who is uncertain of his immunization history and has no record of immunization should receive the PCV13 vaccine. All adults 65 years of age and older should receive this  vaccine. An adult aged 19 years or older who has certain medical conditions and has not been previously immunized should receive 1 dose of PCV13 vaccine. This PCV13 should be followed with a dose of pneumococcal polysaccharide (PPSV23) vaccine. Adults who are at high risk for pneumococcal disease should obtain the PPSV23 vaccine at least 8 weeks after the dose of PCV13 vaccine. Adults older than 63 years of age who have normal immune system function should obtain the PPSV23 vaccine dose at least 1 year after the dose of PCV13 vaccine.  Pneumococcal polysaccharide (PPSV23) vaccine. When PCV13 is also indicated, PCV13 should be obtained first. All adults aged 65 years and older should be immunized. An adult younger than age 65 years who has certain medical conditions should be immunized. Any person who resides in a nursing home or long-term care facility should be immunized. An adult smoker should be immunized. People with an immunocompromised condition and certain other conditions should receive both PCV13 and PPSV23 vaccines. People with human immunodeficiency virus (HIV) infection should be immunized as soon as possible after diagnosis. Immunization during chemotherapy or radiation therapy should be avoided. Routine use of PPSV23 vaccine is not recommended for American Indians, [place] Natives, or people younger than 65 years unless there are medical conditions that require PPSV23 vaccine. When indicated, people who have unknown immunization and have no record of immunization should receive PPSV23 vaccine. One-time revaccination 5 years after the first dose of PPSV23 is recommended for people aged 19-64 years who have chronic kidney failure, nephrotic syndrome, asplenia, or immunocompromised conditions. People who received 1-2 doses of PPSV23 before age 65 years should receive another dose of PPSV23 vaccine at age 65 years or later if at least 5 years have passed since the previous dose. Doses of PPSV23 are not  needed for people immunized with PPSV23 at or after age 65 years.  Meningococcal vaccine. Adults with asplenia or persistent complement component deficiencies should receive 2 doses of quadrivalent meningococcal conjugate (MenACWY-D) vaccine. The doses should be obtained   at least 2 months apart. Microbiologists working with certain meningococcal bacteria, [place] recruits, people at risk during an outbreak, and people who travel to or live in countries with a high rate of meningitis should be immunized. A first-year college student up through age 34 years who is living in a residence hall should receive a dose if she did not receive a dose on or after her 16th birthday. Adults who have certain high-risk conditions should receive one or more doses of vaccine.  Hepatitis A vaccine. Adults who wish to be protected from this disease, have certain high-risk conditions, work with hepatitis A-infected animals, work in hepatitis A research labs, or travel to or work in countries with a high rate of hepatitis A should be immunized. Adults who were previously unvaccinated and who anticipate close contact with an international adoptee during the first 60 days after arrival in the [place] from a country with a high rate of hepatitis A should be immunized.  Hepatitis B vaccine. Adults who wish to be protected from this disease, have certain high-risk conditions, may be exposed to blood or other infectious body fluids, are household contacts or sex partners of hepatitis B positive people, are clients or workers in certain care facilities, or travel to or work in countries with a high rate of hepatitis B should be immunized.  Haemophilus influenzae type b (Hib) vaccine. A previously unvaccinated person with asplenia or sickle cell disease or having a scheduled splenectomy should receive 1 dose of Hib vaccine. Regardless of previous immunization, a recipient of a hematopoietic stem cell transplant should receive a  3-dose series 6-12 months after her successful transplant. Hib vaccine is not recommended for adults with HIV infection. Preventive Services / Frequency Ages 35 to 4 years  Blood pressure check.** / Every 3-5 years.  Lipid and cholesterol check.** / Every 5 years beginning at age 60.  Clinical breast exam.** / Every 3 years for women in their 71s and 10s.  BRCA-related cancer risk assessment.** / For women who have family members with a BRCA-related cancer (breast, ovarian, tubal, or peritoneal cancers).  Pap test.** / Every 2 years from ages 76 through 26. Every 3 years starting at age 61 through age 76 or 93 with a history of 3 consecutive normal Pap tests.  HPV screening.** / Every 3 years from ages 37 through ages 60 to 51 with a history of 3 consecutive normal Pap tests.  Hepatitis C blood test.** / For any individual with known risks for hepatitis C.  Skin self-exam. / Monthly.  Influenza vaccine. / Every year.  Tetanus, diphtheria, and acellular pertussis (Tdap, Td) vaccine.** / Consult your health care provider. Pregnant women should receive 1 dose of Tdap vaccine during each pregnancy. 1 dose of Td every 10 years.  Varicella vaccine.** / Consult your health care provider. Pregnant females who do not have evidence of immunity should receive the first dose after pregnancy.  HPV vaccine. / 3 doses over 6 months, if 93 and younger. The vaccine is not recommended for use in pregnant females. However, pregnancy testing is not needed before receiving a dose.  Measles, mumps, rubella (MMR) vaccine.** / You need at least 1 dose of MMR if you were born in 1957 or later. You may also need a 2nd dose. For females of childbearing age, rubella immunity should be determined. If there is no evidence of immunity, females who are not pregnant should be vaccinated. If there is no evidence of immunity, females who are  pregnant should delay immunization until after pregnancy.  Pneumococcal  13-valent conjugate (PCV13) vaccine.** / Consult your health care provider.  Pneumococcal polysaccharide (PPSV23) vaccine.** / 1 to 2 doses if you smoke cigarettes or if you have certain conditions.  Meningococcal vaccine.** / 1 dose if you are age 68 to 8 years and a Market researcher living in a residence hall, or have one of several medical conditions, you need to get vaccinated against meningococcal disease. You may also need additional booster doses.  Hepatitis A vaccine.** / Consult your health care provider.  Hepatitis B vaccine.** / Consult your health care provider.  Haemophilus influenzae type b (Hib) vaccine.** / Consult your health care provider. Ages 7 to 53 years  Blood pressure check.** / Every year.  Lipid and cholesterol check.** / Every 5 years beginning at age 25 years.  Lung cancer screening. / Every year if you are aged 11-80 years and have a 30-pack-year history of smoking and currently smoke or have quit within the past 15 years. Yearly screening is stopped once you have quit smoking for at least 15 years or develop a health problem that would prevent you from having lung cancer treatment.  Clinical breast exam.** / Every year after age 48 years.  BRCA-related cancer risk assessment.** / For women who have family members with a BRCA-related cancer (breast, ovarian, tubal, or peritoneal cancers).  Mammogram.** / Every year beginning at age 41 years and continuing for as long as you are in good health. Consult with your health care provider.  Pap test.** / Every 3 years starting at age 65 years through age 37 or 70 years with a history of 3 consecutive normal Pap tests.  HPV screening.** / Every 3 years from ages 72 years through ages 60 to 40 years with a history of 3 consecutive normal Pap tests.  Fecal occult blood test (FOBT) of stool. / Every year beginning at age 21 years and continuing until age 5 years. You may not need to do this test if you get  a colonoscopy every 10 years.  Flexible sigmoidoscopy or colonoscopy.** / Every 5 years for a flexible sigmoidoscopy or every 10 years for a colonoscopy beginning at age 35 years and continuing until age 48 years.  Hepatitis C blood test.** / For all people born from [birth year] through 1965 and any individual with known risks for hepatitis C.  Skin self-exam. / Monthly.  Influenza vaccine. / Every year.  Tetanus, diphtheria, and acellular pertussis (Tdap/Td) vaccine.** / Consult your health care provider. Pregnant women should receive 1 dose of Tdap vaccine during each pregnancy. 1 dose of Td every 10 years.  Varicella vaccine.** / Consult your health care provider. Pregnant females who do not have evidence of immunity should receive the first dose after pregnancy.  Zoster vaccine.** / 1 dose for adults aged 30 years or older.  Measles, mumps, rubella (MMR) vaccine.** / You need at least 1 dose of MMR if you were born in 1957 or later. You may also need a second dose. For females of childbearing age, rubella immunity should be determined. If there is no evidence of immunity, females who are not pregnant should be vaccinated. If there is no evidence of immunity, females who are pregnant should delay immunization until after pregnancy.  Pneumococcal 13-valent conjugate (PCV13) vaccine.** / Consult your health care provider.  Pneumococcal polysaccharide (PPSV23) vaccine.** / 1 to 2 doses if you smoke cigarettes or if you have certain conditions.  Meningococcal vaccine.** /  Consult your health care provider.  Hepatitis A vaccine.** / Consult your health care provider.  Hepatitis B vaccine.** / Consult your health care provider.  Haemophilus influenzae type b (Hib) vaccine.** / Consult your health care provider. Ages 17 years and over  Blood pressure check.** / Every year.  Lipid and cholesterol check.** / Every 5 years beginning at age 61 years.  Lung cancer screening. / Every year if you  are aged 60-80 years and have a 30-pack-year history of smoking and currently smoke or have quit within the past 15 years. Yearly screening is stopped once you have quit smoking for at least 15 years or develop a health problem that would prevent you from having lung cancer treatment.  Clinical breast exam.** / Every year after age 92 years.  BRCA-related cancer risk assessment.** / For women who have family members with a BRCA-related cancer (breast, ovarian, tubal, or peritoneal cancers).  Mammogram.** / Every year beginning at age 18 years and continuing for as long as you are in good health. Consult with your health care provider.  Pap test.** / Every 3 years starting at age 66 years through age 72 or 58 years with 3 consecutive normal Pap tests. Testing can be stopped between 65 and 70 years with 3 consecutive normal Pap tests and no abnormal Pap or HPV tests in the past 10 years.  HPV screening.** / Every 3 years from ages 38 years through ages 45 or 15 years with a history of 3 consecutive normal Pap tests. Testing can be stopped between 65 and 70 years with 3 consecutive normal Pap tests and no abnormal Pap or HPV tests in the past 10 years.  Fecal occult blood test (FOBT) of stool. / Every year beginning at age 43 years and continuing until age 2 years. You may not need to do this test if you get a colonoscopy every 10 years.  Flexible sigmoidoscopy or colonoscopy.** / Every 5 years for a flexible sigmoidoscopy or every 10 years for a colonoscopy beginning at age 88 years and continuing until age 18 years.  Hepatitis C blood test.** / For all people born from [birth year] through 1964 and any individual with known risks for hepatitis C.  Osteoporosis screening.** / A one-time screening for women ages 40 years and over and women at risk for fractures or osteoporosis.  Skin self-exam. / Monthly.  Influenza vaccine. / Every year.  Tetanus, diphtheria, and acellular pertussis (Tdap/Td)  vaccine.** / 1 dose of Td every 10 years.  Varicella vaccine.** / Consult your health care provider.  Zoster vaccine.** / 1 dose for adults aged 35 years or older.  Pneumococcal 13-valent conjugate (PCV13) vaccine.** / Consult your health care provider.  Pneumococcal polysaccharide (PPSV23) vaccine.** / 1 dose for all adults aged 69 years and older.  Meningococcal vaccine.** / Consult your health care provider.  Hepatitis A vaccine.** / Consult your health care provider.  Hepatitis B vaccine.** / Consult your health care provider.  Haemophilus influenzae type b (Hib) vaccine.** / Consult your health care provider. ** Family history and personal history of risk and conditions may change your health care provider's recommendations.   This information is not intended to replace advice given to you by your health care provider. Make sure you discuss any questions you have with your health care provider.   Document Released: 09/26/2001 Document Revised: 08/21/2014 Document Reviewed: 12/26/2010 Elsevier Interactive Patient Education 2016 [place]

## 2015-06-28 NOTE — Progress Notes (Signed)
Pre visit review using our clinic review tool, if applicable. No additional management support is needed unless otherwise documented below in the visit note. 

## 2015-07-13 ENCOUNTER — Other Ambulatory Visit: Payer: Self-pay | Admitting: Obstetrics & Gynecology

## 2015-07-13 DIAGNOSIS — Z6833 Body mass index (BMI) 33.0-33.9, adult: Secondary | ICD-10-CM | POA: Diagnosis not present

## 2015-07-13 DIAGNOSIS — Z124 Encounter for screening for malignant neoplasm of cervix: Secondary | ICD-10-CM | POA: Diagnosis not present

## 2015-07-14 LAB — CYTOLOGY - PAP

## 2015-08-25 DIAGNOSIS — G245 Blepharospasm: Secondary | ICD-10-CM | POA: Diagnosis not present

## 2015-11-25 DIAGNOSIS — M436 Torticollis: Secondary | ICD-10-CM | POA: Diagnosis not present

## 2016-01-11 ENCOUNTER — Encounter: Payer: Self-pay | Admitting: Internal Medicine

## 2016-02-02 DIAGNOSIS — Z1231 Encounter for screening mammogram for malignant neoplasm of breast: Secondary | ICD-10-CM | POA: Diagnosis not present

## 2016-02-02 LAB — HM MAMMOGRAPHY

## 2016-02-03 ENCOUNTER — Encounter: Payer: Self-pay | Admitting: Internal Medicine

## 2016-02-21 ENCOUNTER — Other Ambulatory Visit: Payer: Self-pay | Admitting: Internal Medicine

## 2016-03-02 DIAGNOSIS — R29891 Ocular torticollis: Secondary | ICD-10-CM | POA: Diagnosis not present

## 2016-03-02 DIAGNOSIS — G245 Blepharospasm: Secondary | ICD-10-CM | POA: Diagnosis not present

## 2016-03-02 DIAGNOSIS — M436 Torticollis: Secondary | ICD-10-CM | POA: Diagnosis not present

## 2016-04-05 ENCOUNTER — Other Ambulatory Visit (INDEPENDENT_AMBULATORY_CARE_PROVIDER_SITE_OTHER): Payer: Medicare Other

## 2016-04-05 ENCOUNTER — Encounter: Payer: Self-pay | Admitting: Internal Medicine

## 2016-04-05 ENCOUNTER — Ambulatory Visit (INDEPENDENT_AMBULATORY_CARE_PROVIDER_SITE_OTHER): Payer: Medicare Other | Admitting: Internal Medicine

## 2016-04-05 DIAGNOSIS — G245 Blepharospasm: Secondary | ICD-10-CM

## 2016-04-05 DIAGNOSIS — E890 Postprocedural hypothyroidism: Secondary | ICD-10-CM

## 2016-04-05 LAB — T4, FREE: FREE T4: 0.77 ng/dL (ref 0.60–1.60)

## 2016-04-05 LAB — BASIC METABOLIC PANEL
BUN: 11 mg/dL (ref 6–23)
CALCIUM: 9.6 mg/dL (ref 8.4–10.5)
CHLORIDE: 104 meq/L (ref 96–112)
CO2: 28 meq/L (ref 19–32)
Creatinine, Ser: 0.88 mg/dL (ref 0.40–1.20)
GFR: 68.73 mL/min (ref >60.00)
Glucose, Bld: 96 mg/dL (ref 70–99)
POTASSIUM: 4.3 meq/L (ref 3.5–5.1)
SODIUM: 140 meq/L (ref 135–145)

## 2016-04-05 LAB — HEPATIC FUNCTION PANEL
ALK PHOS: 78 U/L (ref 39–117)
ALT: 20 U/L (ref 0–35)
AST: 23 U/L (ref 0–37)
Albumin: 4.4 g/dL (ref 3.5–5.2)
BILIRUBIN DIRECT: 0.1 mg/dL (ref 0.0–0.3)
TOTAL PROTEIN: 7.8 g/dL (ref 6.0–8.3)
Total Bilirubin: 0.5 mg/dL (ref 0.2–1.2)

## 2016-04-05 LAB — TSH: TSH: 1.83 u[IU]/mL (ref 0.35–4.50)

## 2016-04-05 NOTE — Assessment & Plan Note (Signed)
F/u w/[facility] - Dr Algis Greenhouse

## 2016-04-05 NOTE — Progress Notes (Signed)
Pre visit review using our clinic review tool, if applicable. No additional management support is needed unless otherwise documented below in the visit note. 

## 2016-04-05 NOTE — Progress Notes (Signed)
Subjective:  Patient ID: Linda Hayes, female    DOB: 1950-09-07  Age: 65 y.o. MRN: [id number]  CC: No chief complaint on file.   HPI Linda Hayes presents for tremors - worse and abn thyroid test. C/o worsening shaking, fogginess  Outpatient Medications Prior to Visit  Medication Sig Dispense Refill  . ALPRAZolam (XANAX) 0.5 MG tablet Take 0.5 mg by mouth daily as needed.    . cholecalciferol (VITAMIN D) 1000 UNITS tablet Take 1,000 Units by mouth daily.      [name] ibuprofen (ADVIL,MOTRIN) 800 MG tablet Take 1 tablet (800 mg total) by mouth every 8 (eight) hours as needed. 60 tablet 0  . sertraline (ZOLOFT) 100 MG tablet Take 150 mg by mouth daily.     [name] SYNTHROID 125 MCG tablet TAKE 1 TABLET (125 MCG TOTAL) BY MOUTH DAILY. 30 tablet 3  . zolpidem (AMBIEN CR) 6.25 MG CR tablet Take 6.25 mg by mouth at bedtime.  5  . fluticasone (FLONASE) 50 MCG/ACT nasal spray Place 2 sprays into both nostrils daily. (Patient not taking: Reported on 04/05/2016) 16 g 6  . SYNTHROID 125 MCG tablet TAKE 1 TABLET (125 MCG TOTAL) BY MOUTH DAILY.  5   No facility-administered medications prior to visit.     ROS Review of Systems  Constitutional: Negative for activity change, appetite change, chills, fatigue and unexpected weight change.  HENT: Negative for congestion, mouth sores and sinus pressure.   Eyes: Negative for visual disturbance.  Respiratory: Negative for cough and chest tightness.   Gastrointestinal: Negative for abdominal pain and nausea.  Genitourinary: Negative for difficulty urinating, frequency and vaginal pain.  Musculoskeletal: Negative for back pain and gait problem.  Skin: Negative for pallor and rash.  Neurological: Positive for tremors, facial asymmetry and weakness. Negative for dizziness, numbness and headaches.  Psychiatric/Behavioral: Negative for confusion and sleep disturbance. The patient is nervous/anxious.     Objective:  BP 110/70   Pulse 93   Wt 205 lb (93 kg)    SpO2 94%   BMI 35.19 kg/m   BP Readings from Last 3 Encounters:  04/05/16 110/70  06/28/15 120/90  05/17/15 116/84    Wt Readings from Last 3 Encounters:  04/05/16 205 lb (93 kg)  06/28/15 195 lb (88.5 kg)  05/17/15 190 lb (86.2 kg)    Physical Exam  Constitutional: She appears well-developed. No distress.  HENT:  Head: Normocephalic.  Right Ear: External ear normal.  Left Ear: External ear normal.  Nose: Nose normal.  Mouth/Throat: Oropharynx is clear and moist.  Eyes: Conjunctivae are normal. Pupils are equal, round, and reactive to light. Right eye exhibits no discharge. Left eye exhibits no discharge.  Neck: Normal range of motion. Neck supple. No JVD present. No tracheal deviation present. No thyromegaly present.  Cardiovascular: Normal rate, regular rhythm and normal heart sounds.   Pulmonary/Chest: No stridor. No respiratory distress. She has no wheezes.  Abdominal: Soft. Bowel sounds are normal. She exhibits no distension and no mass. There is no tenderness. There is no rebound and no guarding.  Musculoskeletal: She exhibits no edema or tenderness.  Lymphadenopathy:    She has no cervical adenopathy.  Neurological: She displays normal reflexes. No cranial nerve deficit. She exhibits normal muscle tone. Coordination abnormal.  Skin: No rash noted. No erythema.  Psychiatric: She has a normal mood and affect. Her behavior is normal. Judgment and thought content normal.    Lab Results  Component Value Date   WBC 8.4  06/24/2015   HGB 14.6 06/24/2015   HCT 43.2 06/24/2015   PLT 315.0 06/24/2015   GLUCOSE 94 06/24/2015   CHOL 198 06/24/2015   TRIG 201.0 (H) 06/24/2015   HDL 31.20 (L) 06/24/2015   LDLDIRECT 123.0 06/24/2015   ALT 16 06/24/2015   AST 18 06/24/2015   NA 142 06/24/2015   K 3.8 06/24/2015   CL 107 06/24/2015   CREATININE 0.88 06/24/2015   BUN 16 06/24/2015   CO2 26 06/24/2015   TSH 0.20 (L) 06/24/2015    Dg Chest 2 View  Result Date:  05/17/2015 CLINICAL DATA:  Chest pain for 4 days, LEFT-sided in radiating down LEFT arm, history asthma EXAM: CHEST  2 VIEW COMPARISON:  03/17/2005 FINDINGS: Upper normal heart size. Tortuous aorta. Mediastinal contours and pulmonary vascularity normal. Lungs clear. No pleural effusion or pneumothorax. Osseous structures unremarkable. Surgical clips in the cervical region bilaterally likely reflect prior thyroidectomy. IMPRESSION: No acute abnormalities pre Electronically Signed   By: Lavonia Dana M.D.   On: 05/17/2015 14:30    Assessment & Plan:   There are no diagnoses linked to this encounter. I am having Linda Hayes maintain her cholecalciferol, sertraline, ibuprofen, fluticasone, ALPRAZolam, zolpidem, SYNTHROID, and ALPRAZolam.  Meds ordered this encounter  Medications  . ALPRAZolam (XANAX) 1 MG tablet    Sig: Take 1 tablet by mouth 3 (three) times daily as needed.     Follow-up: No Follow-up on file.  Walker Kehr, MD

## 2016-04-05 NOTE — Assessment & Plan Note (Signed)
S/p surgery and XRT 2001 On Levothroid Labs

## 2016-04-06 ENCOUNTER — Encounter: Payer: Self-pay | Admitting: Internal Medicine

## 2016-05-23 DIAGNOSIS — H5213 Myopia, bilateral: Secondary | ICD-10-CM | POA: Diagnosis not present

## 2016-05-23 DIAGNOSIS — H35371 Puckering of macula, right eye: Secondary | ICD-10-CM | POA: Diagnosis not present

## 2016-05-23 DIAGNOSIS — Z23 Encounter for immunization: Secondary | ICD-10-CM | POA: Diagnosis not present

## 2016-05-23 DIAGNOSIS — H25013 Cortical age-related cataract, bilateral: Secondary | ICD-10-CM | POA: Diagnosis not present

## 2016-05-23 DIAGNOSIS — H35372 Puckering of macula, left eye: Secondary | ICD-10-CM | POA: Diagnosis not present

## 2016-06-17 ENCOUNTER — Ambulatory Visit (HOSPITAL_COMMUNITY)
Admission: EM | Admit: 2016-06-17 | Discharge: 2016-06-17 | Disposition: A | Discharge status: Home / Self Care | Payer: Medicare Other | Attending: Emergency Medicine | Admitting: Emergency Medicine

## 2016-06-17 ENCOUNTER — Encounter (HOSPITAL_COMMUNITY): Payer: Self-pay | Admitting: Emergency Medicine

## 2016-06-17 DIAGNOSIS — J0100 Acute maxillary sinusitis, unspecified: Secondary | ICD-10-CM

## 2016-06-17 MED ORDER — DOXYCYCLINE HYCLATE 100 MG PO CAPS: 100.0000 mg | ORAL_CAPSULE | Freq: Two times a day (BID) | ORAL | 0 refills | Status: DC

## 2016-06-17 NOTE — ED Triage Notes (Signed)
Here for cold sx onset 4-5 days associated w/facial pressure, dry cough, nasal congestion/drainage, HA  Denies fevers, chills  Taking OTC Mucinex, Advil, nasal sprays  A&O x4... NAD

## 2016-06-17 NOTE — ED Provider Notes (Signed)
CSN: [id number]     Arrival date & time 06/17/16  1200 History   First MD Initiated Contact with Patient 06/17/16 1221     Chief Complaint  Patient presents with  . URI   (Consider location/radiation/quality/duration/timing/severity/associated sxs/prior Treatment) 65 year old female accompanied by her husband with concern over nasal congestion, intense left sided sinus pressure and pain for the past 5 days. Continue to get worse. Now more headaches and dry cough. Denies any fever or GI symptoms. Has been taking Mucinex and Advil with minimal relief.    The history is provided by the patient and the spouse.    Past Medical History:  Diagnosis Date  . ANXIETY 03/17/2008  . ASTHMATIC BRONCHITIS, ACUTE 10/01/2008  . 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 DEPRESSION 03/17/2008  . GAIT ATAXIA 06/11/2009  . HYPOTHYROIDISM 03/17/2008  . INSOMNIA, PERSISTENT 03/17/2008  . Meige syndrome (blepharospasm with oromandibular dystonia)   . OBESITY 03/17/2008  . Palpitations 10/01/2008  . SWEATING 03/17/2008   Past Surgical History:  Procedure Laterality Date  . THYROIDECTOMY  1999   Family History  Problem Relation Age of Onset  . Hypertension Other   . Depression Other   . Diabetes Other    Social History  Substance Use Topics  . Smoking status: Never Smoker  . Smokeless tobacco: Never Used  . Alcohol use 0.0 oz/week     Comment: one every 6 months   OB History    Gravida Para Term Preterm AB Living   3             SAB TAB Ectopic Multiple Live Births                 Review of Systems  Constitutional: Positive for fatigue. Negative for appetite change, chills and fever.  HENT: Positive for congestion, postnasal drip and sinus pressure. Negative for ear pain, rhinorrhea, sneezing and sore throat.   Eyes: Negative for discharge.  Respiratory: Positive for cough. Negative for chest tightness, shortness of breath and wheezing.   Cardiovascular: Negative for chest pain.  Gastrointestinal: Negative for  abdominal pain, diarrhea, nausea and vomiting.  Musculoskeletal: Negative for neck pain and neck stiffness.  Skin: Negative for rash.  Neurological: Positive for headaches. Negative for dizziness and light-headedness.    Allergies  Ciprofloxacin; Erythromycin; Gabapentin; Hydrocodone; Penicillins; Tramadol; and Ritalin  [methylphenidate]  Home Medications   Prior to Admission medications   Medication Sig Start Date End Date Taking? Authorizing Provider  ALPRAZolam Duanne Moron) 1 MG tablet Take 1 tablet by mouth 3 (three) times daily as needed. 03/22/16  Yes Historical Provider, MD  ibuprofen (ADVIL,MOTRIN) 800 MG tablet Take 1 tablet (800 mg total) by mouth every 8 (eight) hours as needed. 11/25/13  Yes Crecencio Mc, MD  sertraline (ZOLOFT) 100 MG tablet Take 150 mg by mouth daily.    Yes Historical Provider, MD  SYNTHROID 125 MCG tablet TAKE 1 TABLET (125 MCG TOTAL) BY MOUTH DAILY. 02/22/16  Yes Evie Lacks Plotnikov, MD  zolpidem (AMBIEN CR) 6.25 MG CR tablet Take 6.25 mg by mouth at bedtime. 02/02/15  Yes Historical Provider, MD  cholecalciferol (VITAMIN D) 1000 UNITS tablet Take 1,000 Units by mouth daily.      Historical Provider, MD  doxycycline (VIBRAMYCIN) 100 MG capsule Take 1 capsule (100 mg total) by mouth 2 (two) times daily. 06/17/16 06/24/16  Katy Apo, NP  fluticasone (FLONASE) 50 MCG/ACT nasal spray Place 2 sprays into both nostrils daily. Patient not taking: Reported on 06/17/2016  12/17/13   Raquel Dagoberto Ligas, NP   Meds Ordered and Administered this Visit  Medications - No data to display  BP 135/87 (BP Location: Right Arm)   Pulse 93   Temp 97.7 F (36.5 C) (Oral)   Resp 20   SpO2 96%  No data found.   Physical Exam  Constitutional: She is oriented to person, place, and time. She appears well-developed and well-nourished. No distress.  HENT:  Head: Normocephalic and atraumatic.  Right Ear: Hearing, tympanic membrane, external ear and ear canal normal.  Left Ear: Hearing,  tympanic membrane, external ear and ear canal normal.  Nose: Mucosal edema present. Right sinus exhibits no maxillary sinus tenderness and no frontal sinus tenderness. Left sinus exhibits maxillary sinus tenderness and frontal sinus tenderness.  Mouth/Throat: Uvula is midline and mucous membranes are normal. Posterior oropharyngeal erythema present.  Neck: Normal range of motion. Neck supple.  Cardiovascular: Normal rate, regular rhythm and normal heart sounds.   Pulmonary/Chest: Effort normal and breath sounds normal. She has no wheezes. She has no rales.  Lymphadenopathy:    She has no cervical adenopathy.  Neurological: She is alert and oriented to person, place, and time. She displays tremor (due to chronic health neurological issue).  Skin: Skin is warm and dry. Capillary refill takes less than 2 seconds.  Psychiatric: She has a normal mood and affect. Her behavior is normal. Judgment and thought content normal.    Urgent Care Course   Clinical Course    Procedures (including critical care time)  Labs Review Labs Reviewed - No data to display  Imaging Review No results found.   Visual Acuity Review  Right Eye Distance:   Left Eye Distance:   Bilateral Distance:    Right Eye Near:   Left Eye Near:    Bilateral Near:         MDM   1. Acute non-recurrent maxillary sinusitis    Start Doxycycline 100mg  twice a day as directed. Continue Mucinex as directed. Restart Flonase (patient has medication at home) twice a day. Increase fluid intake to help loosen up mucus. May take Advil as needed for pain. Follow-up with your primary care provider in 3 days if not improving.      Katy Apo, NP 06/17/16 1239

## 2016-06-17 NOTE — Discharge Instructions (Signed)
Start Doxycycline 100mg  twice a day as directed. Continue Mucinex as directed. Restart Flonase twice a day. May take Advil as needed for pain. Follow-up with your primary care provider in 3 days if not improving.

## 2016-06-18 ENCOUNTER — Encounter: Payer: Self-pay | Admitting: Internal Medicine

## 2016-06-19 ENCOUNTER — Other Ambulatory Visit: Payer: Self-pay | Admitting: Internal Medicine

## 2016-07-04 DIAGNOSIS — H35373 Puckering of macula, bilateral: Secondary | ICD-10-CM | POA: Diagnosis not present

## 2016-07-04 DIAGNOSIS — H3581 Retinal edema: Secondary | ICD-10-CM | POA: Diagnosis not present

## 2016-07-04 DIAGNOSIS — G244 Idiopathic orofacial dystonia: Secondary | ICD-10-CM | POA: Diagnosis not present

## 2016-07-04 DIAGNOSIS — H43813 Vitreous degeneration, bilateral: Secondary | ICD-10-CM | POA: Diagnosis not present

## 2016-07-19 ENCOUNTER — Other Ambulatory Visit: Payer: Self-pay | Admitting: Internal Medicine

## 2016-07-20 NOTE — Telephone Encounter (Signed)
Rx printed. Faxed to pharmacy.

## 2016-08-19 ENCOUNTER — Other Ambulatory Visit: Payer: Self-pay | Admitting: Internal Medicine

## 2016-08-22 ENCOUNTER — Encounter: Payer: Self-pay | Admitting: Internal Medicine

## 2016-08-23 NOTE — Telephone Encounter (Signed)
  Please advise- ok to Rf Alprazolam?   From: Lupita Raider  Sent: 08/22/2016  5:20 PM  To: [facility] Clinical [facility]  Subject: Non-Urgent Medical Question             It is past time for my xanax to be refilled. I need to know if you have received a refill request  From [facility] in [place]. Thanks

## 2016-08-24 ENCOUNTER — Other Ambulatory Visit: Payer: Self-pay | Admitting: Internal Medicine

## 2016-08-24 NOTE — Telephone Encounter (Signed)
Notified pt refill has been called into [facility] spoke w/anna pharmacist.../lmb

## 2016-09-01 NOTE — Telephone Encounter (Signed)
Called into pharm  

## 2016-09-05 ENCOUNTER — Telehealth: Payer: Self-pay | Admitting: Neurology

## 2016-09-05 NOTE — Telephone Encounter (Signed)
Reviewed opth notes from [place].  Injections only lasting a week or so.  In addition, she used dysport which I don't use.  I would much rather see opth do her injections but is she seeing neuro as well as opth at [place]?  If not, Dr. Mervyn Skeeters at [facility] may be of value for her.

## 2016-09-05 NOTE — Telephone Encounter (Signed)
PT called and wants to come here for her botox injections/Dawn CB# [phone]

## 2016-09-05 NOTE — Telephone Encounter (Signed)
Spoke with patient.  She states that ophthalmology is doing her injections. She has had them done in the past by neurology. She states she doesn't think they are working as well because it doesn't feel like ophthalmology injects as deep into the muscle as the neurologist did.  Her husband is not doing well healthwise and would like a doctor closer to home to do injections for her.  She states she would like to come in to discuss. Please advise if okay to schedule appt to talk or if she should see local ophthalmologist?

## 2016-09-05 NOTE — Telephone Encounter (Signed)
I am absolutely happy to see her but not sure that I will be able to assist.

## 2016-09-05 NOTE — Telephone Encounter (Signed)
See last visit note from 2016. You had informed her that these would not be helpful for her. Is this worth another visit with you to discuss?

## 2016-09-05 NOTE — Telephone Encounter (Signed)
Appt made with patient.  

## 2016-09-26 NOTE — Progress Notes (Signed)
Linda Hayes was seen today in neurologic consultation at the request of Walker Kehr, MD.  This patient is accompanied in the office by her spouse who supplements the history.  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  Previously been seen at the [facility] for the same.  I reviewed records from Dr. Algis Greenhouse.  The patient reports that the condition began in approximately  2007.  The pt states that she first went to [facility] and was dx with dry eye.  She was then referred to Dr. Doy Mince and was diagnosed with blepharospasm and she had botox type A.  This seemed to help.  She states that Dr. Doy Mince changed the injections to dysport not long after they started them and that helped as well.  Dr Doy Mince left for [facility] in 2011.  She started seeing Dr. Krista Blue and she did the dysport for about a year but the injections seemed to not be helping.   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 Doy Mince to [facility].  He tried the Dysport again at [facility] for about 6 months and it didn't help so she was referred to Dr. Heide Spark.   She has been seeing her since 2012.  The patient last received Dysport on 01/06/2015.  She states that it only lasts about 2 weeks.  She states that she has tried xeomin as well in 09/2011.  She receives approximately 100 units of Dysport to various areas around the eye as well as the platysma and bilateral SCM.  It appears that she was referred to Dr. Radford Pax for consideration of DBS but the patient did not want such an invasive procedure and did not go to the consultation.  09/28/16 update:  The patient is seen today.  I have not seen her in about 2 years.  She has very significant Meige syndrome.  She has been injected with Botox by ophthalmology most recently, but has requested to get those injections locally.  She has not found these injections to be very helpful.  Her care  everywhere updates, last injections appear to be 03/02/2016.  Notes indicate from prior injections that they were only lasting 1 week.  Unfortunately, detailed notes were not present.  Does state that 300 units of Dysport were used around orbicularis oris, SCM and platysma.   Neuroimaging has previously been performed.  It is not available for my review today.  Pt reports that MRI previously done was normal    ALLERGIES:   Allergies  Allergen Reactions  . Ciprofloxacin     Caused colitis  . Erythromycin     Intolerance - GI  . Gabapentin     cough  . Hydrocodone Nausea And Vomiting  . Penicillins   . Tramadol     n/v  . Ritalin  [Methylphenidate] Palpitations    CURRENT MEDICATIONS:  Outpatient Encounter Prescriptions as of 09/28/2016  Medication Sig  . ALPRAZolam (XANAX) 1 MG tablet TAKE 1 TABLET BY MOUTH 3 TIMES A DAY  . cholecalciferol (VITAMIN D) 1000 UNITS tablet Take 1,000 Units by mouth daily.    [name] ibuprofen (ADVIL,MOTRIN) 800 MG tablet Take 1 tablet (800 mg total) by mouth every 8 (eight) hours as needed.  . sertraline (ZOLOFT) 100 MG tablet TAKE 1 & 1/2 TABLET BY MOUTH DAILY  . SYNTHROID 125 MCG tablet TAKE 1 TABLET (125 MCG TOTAL) BY MOUTH DAILY.  [name]  zolpidem (AMBIEN CR) 6.25 MG CR tablet TAKE 1 TABLET BY MOUTH AT BEDTIME  . [DISCONTINUED] fluticasone (FLONASE) 50 MCG/ACT nasal spray Place 2 sprays into both nostrils daily. (Patient not taking: Reported on 06/17/2016)   No facility-administered encounter medications on file as of 09/28/2016.     PAST MEDICAL HISTORY:   Past Medical History:  Diagnosis Date  . ANXIETY 03/17/2008  . ASTHMATIC BRONCHITIS, ACUTE 10/01/2008  . 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 DEPRESSION 03/17/2008  . GAIT ATAXIA 06/11/2009  . HYPOTHYROIDISM 03/17/2008  . INSOMNIA, PERSISTENT 03/17/2008  . Meige syndrome (blepharospasm with oromandibular dystonia)   . OBESITY 03/17/2008  . Palpitations 10/01/2008  . SWEATING 03/17/2008    PAST SURGICAL HISTORY:     Past Surgical History:  Procedure Laterality Date  . THYROIDECTOMY  1999    SOCIAL HISTORY:   Social History   Social History  . Marital status: Married    Spouse name: N/A  . Number of children: N/A  . Years of education: N/A   Occupational History  . Not on file.   Social History Main Topics  . Smoking status: Never Smoker  . Smokeless tobacco: Never Used  . Alcohol use 0.0 oz/week     Comment: one every 6 months  . Drug use: No  . Sexual activity: Not on file   Other Topics Concern  . Not on file   Social History Narrative  . No narrative on file    FAMILY HISTORY:   Family Status  Relation Status  . Mother Deceased at age 17   lung failure  . Father Deceased at age 37   colitis  . Brother Alive   healthy  . Sister Alive   healthy  . Sister Alive   healthy  . Sister Alive   healthy  . Sister Alive   healthy  . Daughter Alive   hyporthyroidism  . Daughter Alive   hypothyroidism  . Daughter Alive   healthy  . Other     ROS:  Denies any swallowing difficulties.  A complete 10 system review of systems was obtained and was unremarkable apart from what is mentioned above.  PHYSICAL EXAMINATION:    VITALS:   Vitals:   09/28/16 0758  BP: 128/60  Pulse: 89  Weight: 207 lb (93.9 kg)  Height: 5\' 4"  (1.626 m)    GEN:  Normal appears female in no acute distress.  Appears stated age. HEENT:  Normocephalic, atraumatic. The mucous membranes are moist. The superficial temporal arteries are without ropiness or tenderness. Cardiovascular: Regular rate and rhythm. Lungs: Clear to auscultation bilaterally. Neck/Heme: There are no carotid bruits noted bilaterally.  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  Head titubation can be quite significant with SCM on the right visibly twitching.    NEUROLOGICAL: Orientation:  The patient is alert and oriented x 3.   Cranial nerves: There is good facial symmetry.   There is significant blepharospasm.  She cannot keep her eyes open.  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   Extraocular muscles are intact.  Visual fields appear to be full, but she has difficulty participating with formal confrontational testing because of severe blepharospasm.  Speech is fluent and clear.   Soft palate rises symmetrically and there is no tongue deviation. Hearing is intact to conversational tone.   Tone: Tone is good throughout. Sensation: Sensation is intact to light touch and pinprick throughout (facial, trunk, extremities). Vibration is intact at the bilateral big  toe. There is no extinction with double simultaneous stimulation. There is no sensory dermatomal level identified. Coordination:  The patient has no difficulty with RAM's or FNF bilaterally. Motor: Strength is 5/5 in the bilateral upper and lower extremities.  Shoulder shrug is equal and symmetric. There is no pronator drift.  There are no fasciculations noted. Gait and Station: The patient is able to ambulate without difficulty.    IMPRESSION/PLAN  1. Severe meige syndrome with associated blepharospasm, oromandibular dystonia and cervical dystonia  -Long discussion with the patient today.  Greater than 50% of the 25 minute visit in counseling.  She has been receiving Botox in the form of Dysport at [facility].  Unfortunately, the efficacy of these injections has been poor over the last several years. She may have developed some Ab's against it.   Generally, the easiest way to test for these is to inject a small amount of Botox into the corrugator or frontalis muscles, but the patient has absolutely no wrinkles to test.  We talked about this previously, and regardless she thinks she wants to continue with Botox even though it really is not working well.  -I talked to her about Botox type B (Myobloc) as she has tried all of the Botox type A forms and would have neutralizing antibodies presumably to all forms.  However, I also told  her that Botox type B injections burn significantly, and she has tolerated Botox type A injections fairly poorly according to her and those don't burn.  I talked to her in detail about Myobloc, and ultimately she decided that this would probably not be a good option.  -She thinks that Botox worked better when she had it injected by neurology in the past as opposed to ophthalmology.  In general, I prefer Onobotulinum toxin type A in the face to Dysport, just because of spread of medicine, so I will try that on her.  She will not only need the orbicularis oculi mm done but also R SCM, L splenius capitus.  -At one point, she was referred to Dr. Radford Pax for consideration of DBS for this condition.  She did not go for this consultation.  There are some studies that show that GPI DBS could be of benefit, but the patient really does not want this type of invasive procedure.  -will add Splenius capitus on the left and continue right SCM in addition to orbicularis.  Talked about black box warning.

## 2016-09-28 ENCOUNTER — Ambulatory Visit (INDEPENDENT_AMBULATORY_CARE_PROVIDER_SITE_OTHER): Payer: Medicare Other | Admitting: Neurology

## 2016-09-28 ENCOUNTER — Encounter: Payer: Self-pay | Admitting: Neurology

## 2016-09-28 ENCOUNTER — Telehealth: Payer: Self-pay | Admitting: Neurology

## 2016-09-28 VITALS — BP 128/60 | HR 89 | Ht 64.0 in | Wt 207.0 lb

## 2016-09-28 DIAGNOSIS — G244 Idiopathic orofacial dystonia: Secondary | ICD-10-CM | POA: Diagnosis not present

## 2016-09-28 DIAGNOSIS — G243 Spasmodic torticollis: Secondary | ICD-10-CM | POA: Diagnosis not present

## 2016-09-28 NOTE — Telephone Encounter (Signed)
Spoke with patient and made her aware of her Botox appts.

## 2016-10-16 ENCOUNTER — Other Ambulatory Visit: Payer: Self-pay | Admitting: Internal Medicine

## 2016-10-18 NOTE — Progress Notes (Addendum)
Subjective:   Linda Hayes is a 66 y.o. female who presents for Medicare Annual (Subsequent) preventive examination.  Review of Systems:  No ROS.  Medicare Wellness Visit.  Cardiac Risk Factors include: dyslipidemia;sedentary lifestyle;family history of premature cardiovascular disease Sleep patterns: no sleep issues, feels rested on waking and sleeps 7-8 hours nightly.   Home Safety/Smoke Alarms:  Feels safe in home. Smoke alarms in place.   Living environment; residence and Firearm Safety: 1-story house/ trailer, firearms stored safely lives with husband. Seat Belt Safety/Bike Helmet: Wears seat belt.   Counseling:   Eye Exam-  Patient goes routinely to [facility] ophthalmology Dental- patient reports it has been several years, dental resources given   Female:   Pap-    Last 07/12/16,  negative  Mammo-   Last 02/02/16, BI-RADS category 2: Benign      Dexa scan- none, patient educated regarding bone density scan, she declined referral      CCS- Last 08/14/05, patient educated regarding she passed 10 year recall, she declined referral       Objective:     Vitals: BP 122/84   Pulse 87   Resp 18   Ht 5\' 4"  (1.626 m)   Wt 208 lb (94.3 kg)   SpO2 98%   BMI 35.70 kg/m   Body mass index is 35.7 kg/m.   Tobacco History  Smoking Status  . Never Smoker  Smokeless Tobacco  . Never Used     Counseling given: Not Answered   Past Medical History:  Diagnosis Date  . ANXIETY 03/17/2008  . ASTHMATIC BRONCHITIS, ACUTE 10/01/2008  . 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 DEPRESSION 03/17/2008  . GAIT ATAXIA 06/11/2009  . HYPOTHYROIDISM 03/17/2008  . INSOMNIA, PERSISTENT 03/17/2008  . Meige syndrome (blepharospasm with oromandibular dystonia)   . OBESITY 03/17/2008  . Palpitations 10/01/2008  . SWEATING 03/17/2008   Past Surgical History:  Procedure Laterality Date  . THYROIDECTOMY  1999   Family History  Problem Relation Age of Onset  . Hypertension Other   . Depression Other   . Diabetes  Other    History  Sexual Activity  . Sexual activity: Yes    Outpatient Encounter Prescriptions as of 10/19/2016  Medication Sig  . ALPRAZolam (XANAX) 1 MG tablet TAKE 1 TABLET BY MOUTH 3 TIMES A DAY  . cholecalciferol (VITAMIN D) 1000 UNITS tablet Take 1,000 Units by mouth daily.    [name] ibuprofen (ADVIL,MOTRIN) 800 MG tablet Take 1 tablet (800 mg total) by mouth every 8 (eight) hours as needed.  . sertraline (ZOLOFT) 100 MG tablet TAKE 1 & 1/2 TABLET BY MOUTH DAILY  . SYNTHROID 125 MCG tablet TAKE 1 TABLET (125 MCG TOTAL) BY MOUTH DAILY.  [name] zolpidem (AMBIEN CR) 6.25 MG CR tablet TAKE 1 TABLET BY MOUTH AT BEDTIME   No facility-administered encounter medications on file as of 10/19/2016.     Activities of Daily Living In your present state of health, do you have any difficulty performing the following activities: 10/19/2016  Hearing? N  Vision? Y  Difficulty concentrating or making decisions? N  Walking or climbing stairs? N  Dressing or bathing? N  Doing errands, shopping? N  Preparing Food and eating ? N  Using the Toilet? N  In the past six months, have you accidently leaked urine? N  Do you have problems with loss of bowel control? N  Managing your Medications? N  Managing your Finances? N  Housekeeping or managing your Housekeeping? N  Some recent  data might be hidden    Patient Care Team: Cassandria Anger, MD as PCP - General Gatha Mayer, MD as Attending Physician (Gastroenterology) Sheralyn Boatman, MD as Consulting Physician (Psychiatry) Heron Nay (Inactive) as Referring Physician    Assessment:    Physical assessment deferred to PCP.  Exercise Activities and Dietary recommendations Current Exercise Habits: The patient does not participate in regular exercise at present (patient states she wants to start to walk when the weather gets warm), Exercise limited by: None identified  Diet (meal preparation, eat out, water intake, caffeinated beverages, dairy  products, fruits and vegetables): in general, a "healthy" diet  , well balanced  Drinks 4-6 cups of water per day.  Limits soda, multiple servings of fruit daily, 1-2 serving of vegetable daily  Educated and encouraged patient to maintain a low cholesterol, low sugar diet, and to increase increase water and vegetable intake. Goals    . Start to walk again          Start to walk when it gets warm. Continue to take time for myself daily of where I read and relax.      Fall Risk Fall Risk  10/19/2016 09/28/2016  Falls in the past year? Yes No  Number falls in past yr: 1 -  Injury with Fall? No -  Risk for fall due to : Impaired vision -  Follow up Falls prevention discussed -   Depression Screen PHQ 2/9 Scores 10/19/2016  PHQ - 2 Score 1     Cognitive Function       Ad8 score reviewed for issues:  Issues making decisions: no  Less interest in hobbies / activities: no  Repeats questions, stories (family complaining): no  Trouble using ordinary gadgets (microwave, computer, phone): no  Forgets the month or year: no  Mismanaging finances: no  Remembering appts:no  Daily problems with thinking and/or memory: no Ad8 score is= 0     Immunization History  Administered Date(s) Administered  . Influenza Whole 04/28/2010  . Influenza,inj,Quad PF,36+ Mos 06/12/2015, 05/23/2016  . Influenza-Unspecified 05/14/2012, 05/14/2013  . Zoster 03/31/2014   Screening Tests Health Maintenance  Topic Date Due  . COLONOSCOPY  10/19/2017 (Originally 08/15/2015)  . Hepatitis C Screening  10/19/2017 (Originally [birth year])  . HIV Screening  10/19/2017 (Originally 02/24/1967)  . TETANUS/TDAP  10/20/2017 (Originally 02/24/1971)  . MAMMOGRAM  02/01/2018  . PAP SMEAR  07/12/2018  . INFLUENZA VACCINE  Completed      Plan:     Continue to eat heart healthy diet (full of fruits, vegetables, whole grains, lean protein, water--limit salt, fat, and sugar intake) and increase physical activity as  tolerated.  Continue doing brain stimulating activities (puzzles, reading, adult coloring books, staying active) to keep memory sharp.    During the course of the visit the patient was educated and counseled about the following appropriate screening and preventive services:   Vaccines to include Pneumoccal, Influenza, Hepatitis B, Td, Zostavax, HCV  Cardiovascular Disease  Colorectal cancer screening  Bone density screening  Diabetes screening  Glaucoma screening  Mammography/PAP  Nutrition counseling   Patient Instructions (the written plan) was given to the patient.   Michiel Cowboy, RN  10/19/2016  Medical screening examination/treatment/procedure(s) were performed by non-physician practitioner and as supervising physician I was immediately available for consultation/collaboration. I agree with above. Walker Kehr, MD

## 2016-10-18 NOTE — Progress Notes (Signed)
Pre visit review using our clinic review tool, if applicable. No additional management support is needed unless otherwise documented below in the visit note. 

## 2016-10-19 ENCOUNTER — Ambulatory Visit (INDEPENDENT_AMBULATORY_CARE_PROVIDER_SITE_OTHER): Payer: Medicare Other | Admitting: *Deleted

## 2016-10-19 VITALS — BP 122/84 | HR 87 | Resp 18 | Ht 64.0 in | Wt 208.0 lb

## 2016-10-19 DIAGNOSIS — Z0000 Encounter for general adult medical examination without abnormal findings: Secondary | ICD-10-CM | POA: Diagnosis not present

## 2016-10-19 NOTE — Patient Instructions (Addendum)
Continue to eat heart healthy diet (full of fruits, vegetables, whole grains, lean protein, water--limit salt, fat, and sugar intake) and increase physical activity as tolerated.  Continue doing brain stimulating activities (puzzles, reading, adult coloring books, staying active) to keep memory sharp.   Linda Hayes , Thank you for taking time to come for your Medicare Wellness Visit. I appreciate your ongoing commitment to your health goals. Please review the following plan we discussed and let me know if I can assist you in the future.   These are the goals we discussed: Goals    . Start to walk again          Start to walk when it gets warm. Continue to take time for myself daily of where I read and relax.       This is a list of the screening recommended for you and due dates:  Health Maintenance  Topic Date Due  . Colon Cancer Screening  10/19/2017*  .  Hepatitis C: One time screening is recommended by Center for Disease Control  (CDC) for  adults born from [birth year] through 1964.   10/19/2017*  . HIV Screening  10/19/2017*  . Tetanus Vaccine  10/20/2017*  . Mammogram  02/01/2018  . Pap Smear  07/12/2018  . Flu Shot  Completed  *Topic was postponed. The date shown is not the original due date.

## 2016-11-01 ENCOUNTER — Telehealth: Payer: Self-pay | Admitting: Neurology

## 2016-11-01 NOTE — Telephone Encounter (Signed)
Left message on machine for patient to call back. To see if she can move to Friday at 10:45 am. Awaiting call back.

## 2016-11-01 NOTE — Telephone Encounter (Signed)
-----   Message from [place], DO sent at 11/01/2016  7:43 AM EDT ----- See if she wants to move to the open slot.  Will make day easier ----- Message ----- From: Annamaria Helling, CMA Sent: 11/01/2016   7:37 AM To: Eustace Quail Tat, DO  I think it was full when I made the appt.   ----- Message ----- From: Ludwig Clarks, DO Sent: 10/31/2016   5:02 PM To: Annamaria Helling, CMA  Is there a reason she isn't on the botox day itself?

## 2016-11-02 ENCOUNTER — Ambulatory Visit (INDEPENDENT_AMBULATORY_CARE_PROVIDER_SITE_OTHER): Payer: Medicare Other | Admitting: Neurology

## 2016-11-02 DIAGNOSIS — G243 Spasmodic torticollis: Secondary | ICD-10-CM

## 2016-11-02 DIAGNOSIS — G245 Blepharospasm: Secondary | ICD-10-CM

## 2016-11-02 DIAGNOSIS — G244 Idiopathic orofacial dystonia: Secondary | ICD-10-CM | POA: Diagnosis not present

## 2016-11-02 MED ORDER — ONABOTULINUMTOXINA 100 UNITS IJ SOLR
230.0000 [IU] | Freq: Once | INTRAMUSCULAR | Status: AC
  Administered 2016-11-02: 230 [IU] via INTRAMUSCULAR

## 2016-11-02 NOTE — Procedures (Signed)
[facility]   Procedure Note Botox  Attending: Dr. Wells Guiles Linda Hayes  Preoperative Diagnosis(es): Blepharospasm, cervical dystonia, meige syndrome    Consent obtained from: The patient Benefits discussed included, but were not limited to ability to open eyes and therefore see better.  Risk discussed included, but were not limited pain and discomfort, bleeding, bruising, excessive weakness, venous thrombosis, muscle atrophy and drooping of eyelids, dysphagia.  A copy of the patient medication guide was given to the patient which explains the blackbox warning.  Patients identity and treatment sites confirmed Yes.  .  Details of Procedure: Skin was cleaned with alcohol.  A 30 gauge, 1/2 inch needle was introduced to the target muscle.  Prior to injection, the needle plunger was aspirated to make sure the needle was not within a blood vessel.  There was no blood retrieved on aspiration.    Following is a summary of the muscles injected  And the amount of Botulinum toxin used:   Dilution 0.9% preservative free saline mixed with 100 u Botox type A to make 5 U per 0.1cc (2 cc of saline used per 100 U botox)  Injections  Location Left  Right Units  Upper lateral orbicularis oculi  2.5 2.5 5.0  Upper medial orbicularis oculi  2.5 2.5 5.0  Lateral orbicularis oculi  5.0 5.0 10.0  Lower lateral orbicularis oculi  2.5 2.5 5.0  Lower medial orbicularis oculi 2.5 2.5 5.0  Trapezius          TOTAL UNITS:   30.0   Agent: Botulinum Type A ( Onobotulinum Toxin type A ).  1 vials of Botox were used, each containing 50 units and freshly diluted with 2 mL of sterile, non-perserved saline   Procedure Note Botox  Attending: Dr. Wells Guiles Linda Hayes  Patients identity and treatment sites confirmed Yes.  .  Details of Procedure: Skin was cleaned with alcohol.    Prior to injection, the needle plunger was aspirated to make sure the needle was not within a blood vessel.  There was no blood retrieved on  aspiration.    Following is a summary of the muscles injected  And the amount of Botulinum toxin used:   Dilution 0.9% preservative free saline mixed with 100 u Botox type A to make 10 U per 0.1cc  Injections  Location Left  Right Units Number of sites        Sternocleidomastoid  60 60 1  Splenius Capitus, posterior approach 100  100 1  Splenius Capitus, lateral approach 40  40 1  Levator Scapulae      Trapezius            TOTAL UNITS:   200    Agent: Botulinum Type A ( Onobotulinum Toxin type A ).  2 vials of Botox were used, each containing 100 units and freshly diluted with 1 mL of sterile, non-preserved saline   Total injected (Units): 200 units in the neck, 30 units in the face  Total wasted (Units): 70   Pt tolerated procedure well without complications.   Reinjection is anticipated in 3 months.

## 2016-11-16 ENCOUNTER — Other Ambulatory Visit: Payer: Self-pay | Admitting: Internal Medicine

## 2016-11-18 ENCOUNTER — Encounter: Payer: Self-pay | Admitting: Internal Medicine

## 2016-11-21 ENCOUNTER — Other Ambulatory Visit: Payer: Self-pay | Admitting: Internal Medicine

## 2016-11-21 NOTE — Telephone Encounter (Signed)
Called pharmacy spoke w/Alex gave MD approval.../lmb

## 2016-11-24 NOTE — Telephone Encounter (Signed)
rx phoned in

## 2016-11-28 NOTE — Progress Notes (Signed)
Linda Hayes was seen today in neurologic consultation at the request of Walker Kehr, MD.  This patient is accompanied in the office by her spouse who supplements the history.  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  Previously been seen at the [facility] for the same.  I reviewed records from Dr. Algis Greenhouse.  The patient reports that the condition began in approximately  2007.  The pt states that she first went to [place] and was dx with dry eye.  She was then referred to Dr. Doy Mince and was diagnosed with blepharospasm and she had botox type A.  This seemed to help.  She states that Dr. Doy Mince changed the injections to dysport not long after they started them and that helped as well.  Dr Doy Mince left for [facility] in 2011.  She started seeing Dr. Krista Blue and she did the dysport for about a year but the injections seemed to not be helping.   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 Doy Mince to [facility].  He tried the Dysport again at [facility] for about 6 months and it didn't help so she was referred to Dr. Heide Spark.   She has been seeing her since 2012.  The patient last received Dysport on 01/06/2015.  She states that it only lasts about 2 weeks.  She states that she has tried xeomin as well in 09/2011.  She receives approximately 100 units of Dysport to various areas around the eye as well as the platysma and bilateral SCM.  It appears that she was referred to Dr. Radford Pax for consideration of DBS but the patient did not want such an invasive procedure and did not go to the consultation.  09/28/16 update:  The patient is seen today.  I have not seen her in about 2 years.  She has very significant Meige syndrome.  She has been injected with Botox by ophthalmology most recently, but has requested to get those injections locally.  She has not found these injections to be very helpful.  Her care  everywhere updates, last injections appear to be 03/02/2016.  Notes indicate from prior injections that they were only lasting 1 week.  Unfortunately, detailed notes were not present.  Does state that 300 units of Dysport were used around orbicularis oris, SCM and platysma.  11/30/16 update:  Patient seen today.  She is accompanied by her husband who supplements the history.  While she has done Botox for years, her first course with me was on 11/02/2016.  The patient states that the botox lasted 3 days.  She doesn't wish to proceed in the future.   Neuroimaging has previously been performed.  It is not available for my review today.  Pt reports that MRI previously done was normal    ALLERGIES:   Allergies  Allergen Reactions  . Ciprofloxacin     Caused colitis  . Erythromycin     Intolerance - GI  . Gabapentin     cough  . Hydrocodone Nausea And Vomiting  . Penicillins   . Tramadol     n/v  . Ritalin  [Methylphenidate] Palpitations    CURRENT MEDICATIONS:  Outpatient Encounter Prescriptions as of 11/30/2016  Medication Sig  . ALPRAZolam (XANAX) 1 MG tablet TAKE 1 TABLET BY MOUTH 3 TIMES A DAY  . cholecalciferol (VITAMIN D) 1000 UNITS tablet Take 1,000 Units by mouth daily.    [name]  ibuprofen (ADVIL,MOTRIN) 800 MG tablet Take 1 tablet (800 mg total) by mouth every 8 (eight) hours as needed.  . sertraline (ZOLOFT) 100 MG tablet TAKE 1 & 1/2 TABLET BY MOUTH DAILY  . SYNTHROID 125 MCG tablet TAKE 1 TABLET (125 MCG TOTAL) BY MOUTH DAILY.  [name] zolpidem (AMBIEN CR) 6.25 MG CR tablet TAKE 1 TABLET BY MOUTH AT BEDTIME   No facility-administered encounter medications on file as of 11/30/2016.     PAST MEDICAL HISTORY:   Past Medical History:  Diagnosis Date  . ANXIETY 03/17/2008  . ASTHMATIC BRONCHITIS, ACUTE 10/01/2008  . 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 DEPRESSION 03/17/2008  . GAIT ATAXIA 06/11/2009  . HYPOTHYROIDISM 03/17/2008  . INSOMNIA, PERSISTENT 03/17/2008  . Meige syndrome (blepharospasm  with oromandibular dystonia)   . OBESITY 03/17/2008  . Palpitations 10/01/2008  . SWEATING 03/17/2008    PAST SURGICAL HISTORY:   Past Surgical History:  Procedure Laterality Date  . THYROIDECTOMY  1999    SOCIAL HISTORY:   Social History   Social History  . Marital status: Married    Spouse name: N/A  . Number of children: N/A  . Years of education: N/A   Occupational History  . Not on file.   Social History Main Topics  . Smoking status: Never Smoker  . Smokeless tobacco: Never Used  . Alcohol use 0.0 oz/week     Comment: one every 6 months  . Drug use: No  . Sexual activity: Yes   Other Topics Concern  . Not on file   Social History Narrative  . No narrative on file    FAMILY HISTORY:   Family Status  Relation Status  . Mother Deceased at age 31   lung failure  . Father Deceased at age 23   colitis  . Brother Alive   healthy  . Sister Alive   healthy  . Sister Alive   healthy  . Sister Alive   healthy  . Sister Alive   healthy  . Daughter Alive   hyporthyroidism  . Daughter Alive   hypothyroidism  . Daughter Alive   healthy  . Other     ROS:  Denies any swallowing difficulties.  A complete 10 system review of systems was obtained and was unremarkable apart from what is mentioned above.  PHYSICAL EXAMINATION:    VITALS:   Vitals:   11/30/16 1355  BP: 108/68  Pulse: 96  SpO2: 95%  Weight: 207 lb (93.9 kg)  Height: 5\' 4"  (1.626 m)    GEN:  Normal appears female in no acute distress.  Appears stated age. HEENT:  Normocephalic, atraumatic. The mucous membranes are moist. The superficial temporal arteries are without ropiness or tenderness. Cardiovascular: Regular rate and rhythm. Lungs: Clear to auscultation bilaterally. Neck/Heme: There are no carotid bruits noted bilaterally.  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  Head titubation can be quite significant with SCM on the right visibly  twitching.    NEUROLOGICAL: Orientation:  The patient is alert and oriented x 3.   Cranial nerves: There is good facial symmetry.  There is significant blepharospasm. Her eyes were more open than previously. 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   Extraocular muscles are intact.  Visual fields appear to be full, but she has difficulty participating with formal confrontational testing because of severe blepharospasm.  Speech is fluent and clear.   Soft palate rises symmetrically and there is no tongue deviation. Hearing is intact to conversational  tone.   Tone: Tone is good throughout. Sensation: Sensation is intact to light touch throughout Coordination:  The patient has no difficulty with RAM's or FNF bilaterally. Motor: Strength is 5/5 in the bilateral upper and lower extremities.  Shoulder shrug is equal and symmetric. There is no pronator drift.  There are no fasciculations noted. Gait and Station: The patient is able to ambulate without difficulty.    IMPRESSION/PLAN  1. Severe meige syndrome with associated blepharospasm, oromandibular dystonia and cervical dystonia  -she likely has built antibodies against onobotunlinum toxin type A.  She doesn't tolerate injections well either.  We did discuss whether it would be fruitful to pursue DBS (for cervical dystonia, not sure if it would help other aspect of meige) but she doesn't wish to do that.    -I talked to her about Botox type B (Myobloc) as she has tried all of the Botox type A forms and would have neutralizing antibodies presumably to all forms.  However, I also told her that Botox type B injections burn significantly, and she has tolerated Botox type A injections fairly poorly according to her and those don't burn.  I talked to her in detail about Myobloc, and ultimately she decided that this would probably not be a good option.  -She wants to follow up in 6-8 months just to see if there are new options.

## 2016-11-30 ENCOUNTER — Encounter: Payer: Self-pay | Admitting: Neurology

## 2016-11-30 ENCOUNTER — Ambulatory Visit (INDEPENDENT_AMBULATORY_CARE_PROVIDER_SITE_OTHER): Payer: Medicare Other | Admitting: Neurology

## 2016-11-30 VITALS — BP 108/68 | HR 96 | Ht 64.0 in | Wt 207.0 lb

## 2016-11-30 DIAGNOSIS — G243 Spasmodic torticollis: Secondary | ICD-10-CM | POA: Diagnosis not present

## 2016-11-30 DIAGNOSIS — G244 Idiopathic orofacial dystonia: Secondary | ICD-10-CM | POA: Diagnosis not present

## 2016-12-08 ENCOUNTER — Encounter: Payer: Self-pay | Admitting: Internal Medicine

## 2016-12-12 ENCOUNTER — Telehealth: Payer: Self-pay

## 2016-12-12 NOTE — Telephone Encounter (Signed)
-----   Message from Cassandria Anger, MD sent at 12/10/2016  3:44 PM EDT ----- Inez Catalina, Please arrange for a Cologuard test Thanks, AP

## 2016-12-12 NOTE — Telephone Encounter (Signed)
cologuard test ordered.

## 2016-12-18 ENCOUNTER — Other Ambulatory Visit: Payer: Self-pay | Admitting: Internal Medicine

## 2016-12-18 DIAGNOSIS — Z1212 Encounter for screening for malignant neoplasm of rectum: Secondary | ICD-10-CM | POA: Diagnosis not present

## 2016-12-18 DIAGNOSIS — Z1211 Encounter for screening for malignant neoplasm of colon: Secondary | ICD-10-CM | POA: Diagnosis not present

## 2016-12-18 LAB — COLOGUARD: COLOGUARD: NEGATIVE

## 2016-12-19 ENCOUNTER — Encounter: Payer: Self-pay | Admitting: Internal Medicine

## 2016-12-19 ENCOUNTER — Other Ambulatory Visit: Payer: Self-pay | Admitting: Internal Medicine

## 2016-12-19 MED ORDER — ALPRAZOLAM 1 MG PO TABS: 1.0000 mg | ORAL_TABLET | Freq: Three times a day (TID) | ORAL | 0 refills | Status: DC

## 2016-12-19 NOTE — Telephone Encounter (Signed)
Pt sent md mychart msg checking on status for her alprazolam see below MD response.[facility].../lmb  Lucy  Pls call in Rx  The pt has to see me q 6 mo  Thx  ----- Message -----  From: Earnstine Regal, MA  Sent: 12/19/2016  9:32 AM  To: Cassandria Anger, MD  Subject: FW: Non-Urgent Medical Question               ----- Message -----  From: Lupita Raider  Sent: 12/19/2016  9:19 AM  To: [facility] Clinical Pool  Subject: Non-Urgent Medical Question             Checking to make sure you [facility]?

## 2016-12-20 ENCOUNTER — Encounter: Payer: Self-pay | Admitting: Internal Medicine

## 2016-12-23 LAB — COLOGUARD: Cologuard: NEGATIVE

## 2016-12-28 NOTE — Telephone Encounter (Signed)
Test came back negative

## 2017-01-02 ENCOUNTER — Encounter: Payer: Self-pay | Admitting: Internal Medicine

## 2017-01-02 ENCOUNTER — Other Ambulatory Visit (INDEPENDENT_AMBULATORY_CARE_PROVIDER_SITE_OTHER): Payer: Medicare Other

## 2017-01-02 ENCOUNTER — Ambulatory Visit (INDEPENDENT_AMBULATORY_CARE_PROVIDER_SITE_OTHER): Payer: Medicare Other | Admitting: Internal Medicine

## 2017-01-02 VITALS — BP 108/70 | HR 87 | Temp 97.8°F | Ht 64.0 in | Wt 209.0 lb

## 2017-01-02 DIAGNOSIS — G245 Blepharospasm: Secondary | ICD-10-CM | POA: Diagnosis not present

## 2017-01-02 DIAGNOSIS — R7989 Other specified abnormal findings of blood chemistry: Secondary | ICD-10-CM

## 2017-01-02 DIAGNOSIS — E890 Postprocedural hypothyroidism: Secondary | ICD-10-CM

## 2017-01-02 DIAGNOSIS — Z0000 Encounter for general adult medical examination without abnormal findings: Secondary | ICD-10-CM

## 2017-01-02 DIAGNOSIS — F411 Generalized anxiety disorder: Secondary | ICD-10-CM

## 2017-01-02 LAB — CBC WITH DIFFERENTIAL/PLATELET
BASOS PCT: 1.1 % (ref 0.0–3.0)
Basophils Absolute: 0.1 10*3/uL (ref 0.0–0.1)
EOS ABS: 0.2 10*3/uL (ref 0.0–0.7)
EOS PCT: 2.5 % (ref 0.0–5.0)
HCT: 42.8 % (ref 36.0–46.0)
HEMOGLOBIN: 14.8 g/dL (ref 12.0–15.0)
Lymphocytes Relative: 35.5 % (ref 12.0–46.0)
Lymphs Abs: 2.8 10*3/uL (ref 0.7–4.0)
MCHC: 34.7 g/dL (ref 30.0–36.0)
MCV: 94.1 fl (ref 78.0–100.0)
MONO ABS: 0.5 10*3/uL (ref 0.1–1.0)
Monocytes Relative: 6.9 % (ref 3.0–12.0)
Neutro Abs: 4.2 10*3/uL (ref 1.4–7.7)
Neutrophils Relative %: 54 % (ref 43.0–77.0)
Platelets: 330 10*3/uL (ref 150.0–400.0)
RBC: 4.54 Mil/uL (ref 3.87–5.11)
RDW: 13.3 % (ref 11.5–15.5)
WBC: 7.8 10*3/uL (ref 4.0–10.5)

## 2017-01-02 LAB — LIPID PANEL
CHOL/HDL RATIO: 6
Cholesterol: 228 mg/dL — ABNORMAL HIGH (ref 0–200)
HDL: 35.6 mg/dL — ABNORMAL LOW (ref >39.00)
NONHDL: 192.22
TRIGLYCERIDES: 257 mg/dL — AB (ref 0.0–149.0)
VLDL: 51.4 mg/dL — ABNORMAL HIGH (ref 0.0–40.0)

## 2017-01-02 LAB — BASIC METABOLIC PANEL
BUN: 13 mg/dL (ref 6–23)
CHLORIDE: 106 meq/L (ref 96–112)
CO2: 28 mEq/L (ref 19–32)
CREATININE: 0.84 mg/dL (ref 0.40–1.20)
Calcium: 9.2 mg/dL (ref 8.4–10.5)
GFR: 72.36 mL/min (ref >60.00)
Glucose, Bld: 98 mg/dL (ref 70–99)
POTASSIUM: 4.3 meq/L (ref 3.5–5.1)
Sodium: 142 mEq/L (ref 135–145)

## 2017-01-02 LAB — URINALYSIS
BILIRUBIN URINE: NEGATIVE
HGB URINE DIPSTICK: NEGATIVE
KETONES UR: NEGATIVE
Leukocytes, UA: NEGATIVE
Nitrite: NEGATIVE
Specific Gravity, Urine: <=1.005 — AB (ref 1.000–1.030)
TOTAL PROTEIN, URINE-UPE24: NEGATIVE
URINE GLUCOSE: NEGATIVE
UROBILINOGEN UA: 0.2 (ref 0.0–1.0)
pH: 6.5 (ref 5.0–8.0)

## 2017-01-02 LAB — LDL CHOLESTEROL, DIRECT: LDL DIRECT: 142 mg/dL

## 2017-01-02 LAB — HEPATIC FUNCTION PANEL
ALT: 17 U/L (ref 0–35)
AST: 19 U/L (ref 0–37)
Albumin: 4.2 g/dL (ref 3.5–5.2)
Alkaline Phosphatase: 73 U/L (ref 39–117)
BILIRUBIN DIRECT: 0.1 mg/dL (ref 0.0–0.3)
BILIRUBIN TOTAL: 0.5 mg/dL (ref 0.2–1.2)
TOTAL PROTEIN: 7.4 g/dL (ref 6.0–8.3)

## 2017-01-02 LAB — T4, FREE: FREE T4: 1.17 ng/dL (ref 0.60–1.60)

## 2017-01-02 LAB — TSH: TSH: 0.24 u[IU]/mL — AB (ref 0.35–4.50)

## 2017-01-02 NOTE — Assessment & Plan Note (Signed)
Labs

## 2017-01-02 NOTE — Assessment & Plan Note (Signed)
Xanax prn  Potential benefits of a long term benzodiazepines  use as well as potential risks  and complications were explained to the patient and were aknowledged. 

## 2017-01-02 NOTE — Assessment & Plan Note (Signed)
Here for medicare wellness/physical  Diet: heart healthy  Physical activity: not sedentary  Depression/mood screen: situational stress w/illness Hearing: intact to whispered voice  Visual acuity: grossly normal w/glasses, performs annual eye exam  ADLs: capable  Fall risk: low  Home safety: good  Cognitive evaluation: intact to orientation, naming, recall and repetition  EOL planning: adv directives, full code/ I agree  I have personally reviewed and have noted  1. The patient's medical and social history  2. Their use of alcohol, tobacco or illicit drugs  3. Their current medications and supplements  4. The patient's functional ability including ADL's, fall risks, home safety risks and hearing or visual impairment.  5. Diet and physical activities  6. Evidence for depression or mood disorders 7. Roster of providers reviewed    Today patient counseled on age appropriate routine health concerns for screening and prevention, each reviewed and up to date or declined. Immunizations reviewed and up to date or declined. Labs ordered and reviewed. Risk factors for depression reviewed and negative. Hearing function and visual acuity are intact. ADLs screened and addressed as needed. Functional ability and level of safety reviewed and appropriate. Education, counseling and referrals performed based on assessed risks today. Patient provided with a copy of personalized plan for preventive services.  Not driving Cologuard (-) 5/18

## 2017-01-02 NOTE — Progress Notes (Signed)
Subjective:  Patient ID: Linda Hayes, female    DOB: 1951/03/14  Age: 66 y.o. MRN: [id number]  CC: No chief complaint on file.   HPI Linda Hayes presents for a well exam  Outpatient Medications Prior to Visit  Medication Sig Dispense Refill  . ALPRAZolam (XANAX) 1 MG tablet Take 1 tablet (1 mg total) by mouth 3 (three) times daily. 90 tablet 0  . cholecalciferol (VITAMIN D) 1000 UNITS tablet Take 1,000 Units by mouth daily.      [name] ibuprofen (ADVIL,MOTRIN) 800 MG tablet Take 1 tablet (800 mg total) by mouth every 8 (eight) hours as needed. 60 tablet 0  . sertraline (ZOLOFT) 100 MG tablet TAKE 1 & 1/2 TABLET BY MOUTH DAILY 45 tablet 5  . SYNTHROID 125 MCG tablet TAKE 1 TABLET (125 MCG TOTAL) BY MOUTH DAILY. 30 tablet 5  . zolpidem (AMBIEN CR) 6.25 MG CR tablet TAKE 1 TABLET BY MOUTH AT BEDTIME 30 tablet 1   No facility-administered medications prior to visit.     ROS Review of Systems  Constitutional: Negative for activity change, appetite change, chills, fatigue and unexpected weight change.  HENT: Negative for congestion, mouth sores and sinus pressure.   Eyes: Negative for visual disturbance.  Respiratory: Negative for cough and chest tightness.   Gastrointestinal: Negative for abdominal pain and nausea.  Genitourinary: Negative for difficulty urinating, frequency and vaginal pain.  Musculoskeletal: Negative for back pain and gait problem.  Skin: Negative for pallor and rash.  Neurological: Positive for tremors. Negative for dizziness, weakness, numbness and headaches.  Psychiatric/Behavioral: Negative for confusion, sleep disturbance and suicidal ideas. The patient is nervous/anxious.     Objective:  BP 108/70 (BP Location: Right Arm, Patient Position: Sitting, Cuff Size: Large)   Pulse 87   Temp 97.8 F (36.6 C) (Oral)   Ht 5\' 4"  (1.626 m)   Wt 209 lb (94.8 kg)   SpO2 97%   BMI 35.87 kg/m   BP Readings from Last 3 Encounters:  01/02/17 108/70  11/30/16  108/68  10/19/16 122/84    Wt Readings from Last 3 Encounters:  01/02/17 209 lb (94.8 kg)  11/30/16 207 lb (93.9 kg)  10/19/16 208 lb (94.3 kg)    Physical Exam  Constitutional: She appears well-developed. No distress.  HENT:  Head: Normocephalic.  Right Ear: External ear normal.  Left Ear: External ear normal.  Nose: Nose normal.  Mouth/Throat: Oropharynx is clear and moist.  Eyes: Conjunctivae are normal. Pupils are equal, round, and reactive to light. Right eye exhibits no discharge. Left eye exhibits no discharge.  Neck: Normal range of motion. Neck supple. No JVD present. No tracheal deviation present. No thyromegaly present.  Cardiovascular: Normal rate, regular rhythm and normal heart sounds.   Pulmonary/Chest: No stridor. No respiratory distress. She has no wheezes.  Abdominal: Soft. Bowel sounds are normal. She exhibits no distension and no mass. There is no tenderness. There is no rebound and no guarding.  Musculoskeletal: She exhibits no edema or tenderness.  Lymphadenopathy:    She has no cervical adenopathy.  Neurological: She displays normal reflexes. No cranial nerve deficit. She exhibits normal muscle tone. Coordination abnormal.  Skin: No rash noted. No erythema.  Psychiatric: She has a normal mood and affect. Her behavior is normal. Judgment and thought content normal.  head tremor  Lab Results  Component Value Date   WBC 8.4 06/24/2015   HGB 14.6 06/24/2015   HCT 43.2 06/24/2015   PLT 315.0 06/24/2015  GLUCOSE 96 04/05/2016   CHOL 198 06/24/2015   TRIG 201.0 (H) 06/24/2015   HDL 31.20 (L) 06/24/2015   LDLDIRECT 123.0 06/24/2015   ALT 20 04/05/2016   AST 23 04/05/2016   NA 140 04/05/2016   K 4.3 04/05/2016   CL 104 04/05/2016   CREATININE 0.88 04/05/2016   BUN 11 04/05/2016   CO2 28 04/05/2016   TSH 1.83 04/05/2016    No results found.  Assessment & Plan:   There are no diagnoses linked to this encounter. I am having Ms. Funches maintain her  cholecalciferol, ibuprofen, sertraline, SYNTHROID, zolpidem, and ALPRAZolam.  No orders of the defined types were placed in this encounter.    Follow-up: No Follow-up on file.  Walker Kehr, MD

## 2017-01-02 NOTE — Assessment & Plan Note (Signed)
F/u w/dr Tat 

## 2017-01-03 ENCOUNTER — Encounter: Payer: Medicare Other | Admitting: Internal Medicine

## 2017-01-16 ENCOUNTER — Other Ambulatory Visit: Payer: Self-pay | Admitting: Internal Medicine

## 2017-01-16 IMAGING — CR DG CHEST 2V
1 series · 2 of 2 positions shown · non-contrast
Comparison: 03/17/2005

CLINICAL DATA: Chest pain for 4 days, LEFT-sided in radiating down
LEFT arm, history asthma

EXAM:
CHEST  2 VIEW

[Series 1: dg chest 2 view · 0.14mm/px · 2 of 2 slices shown]
[im 1/2]
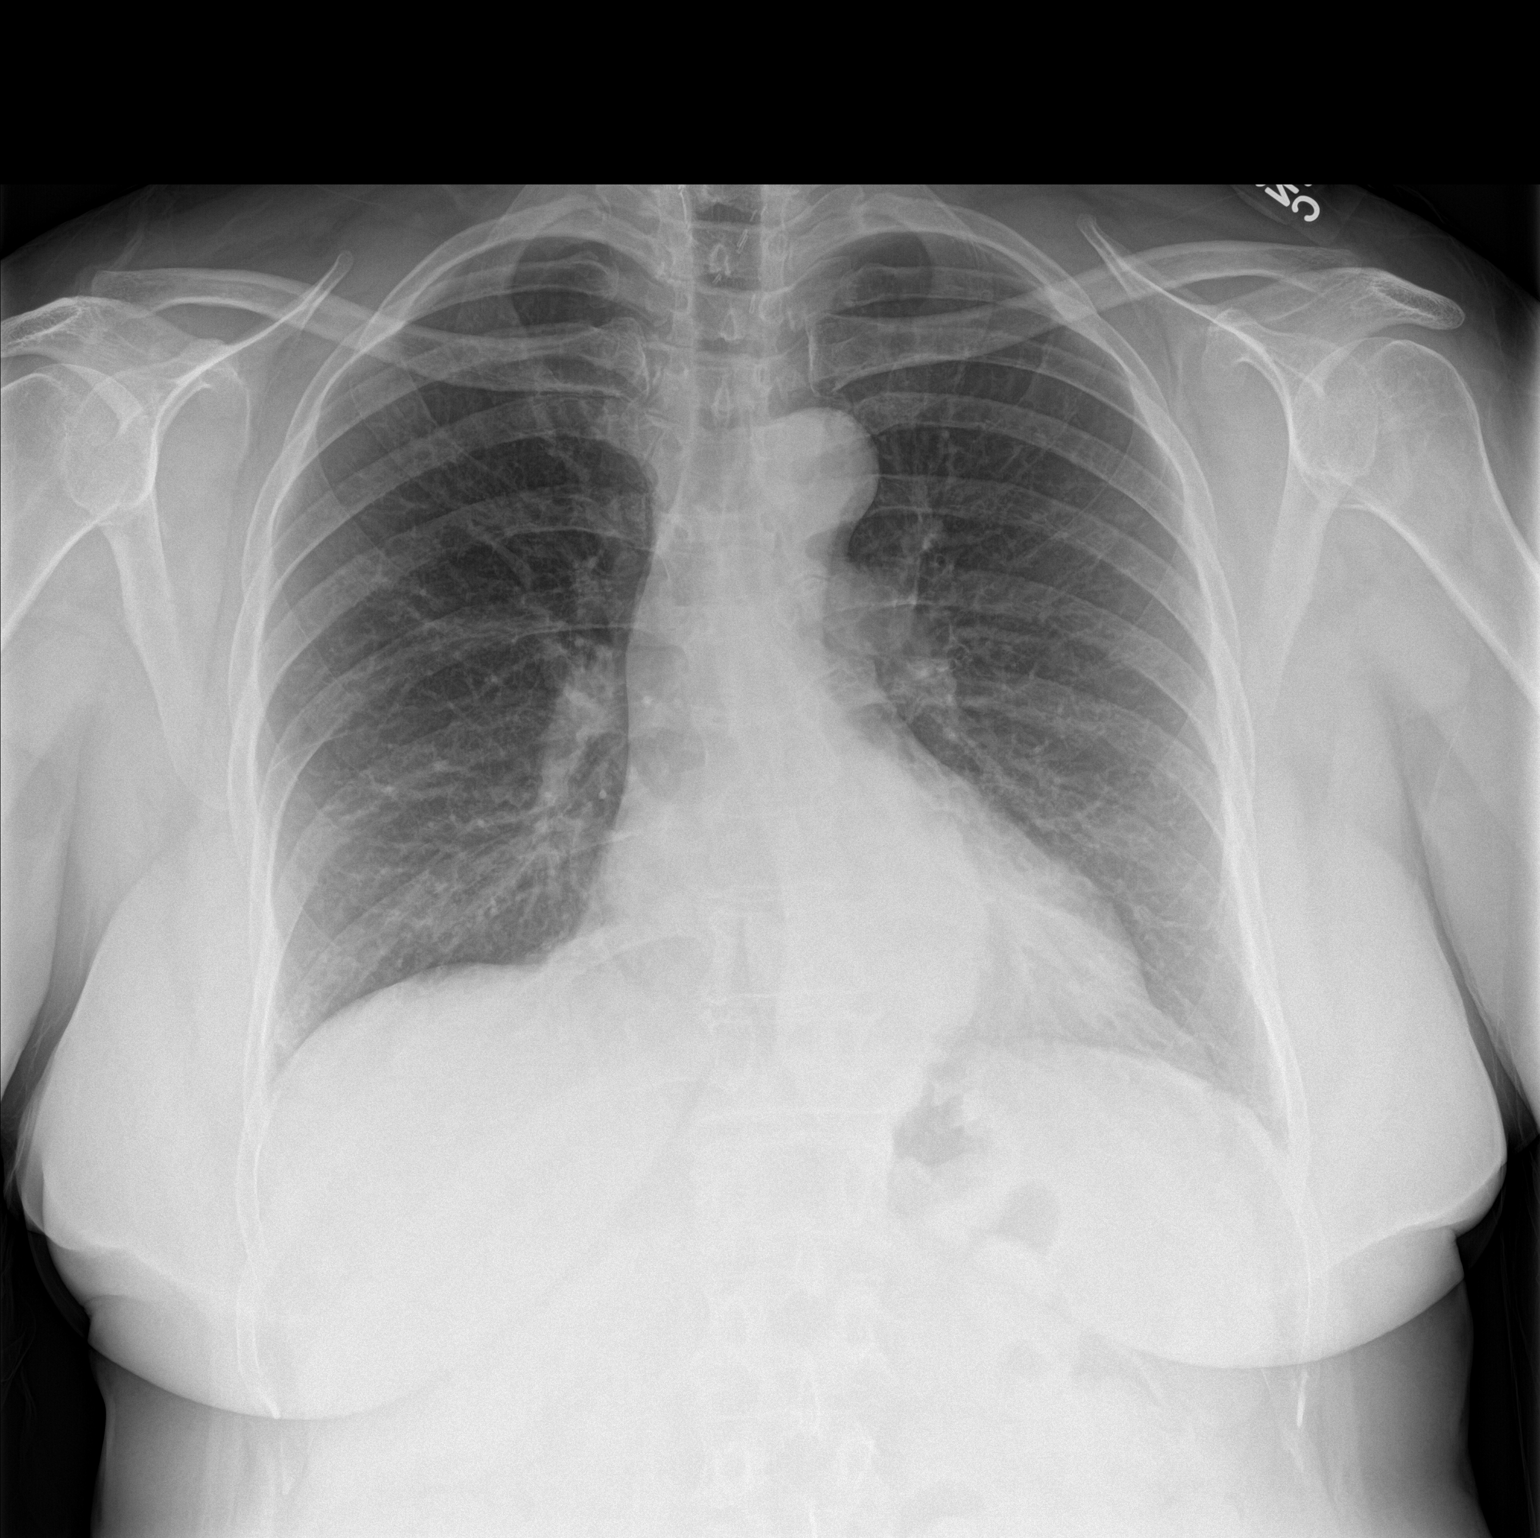
[im 2/2]
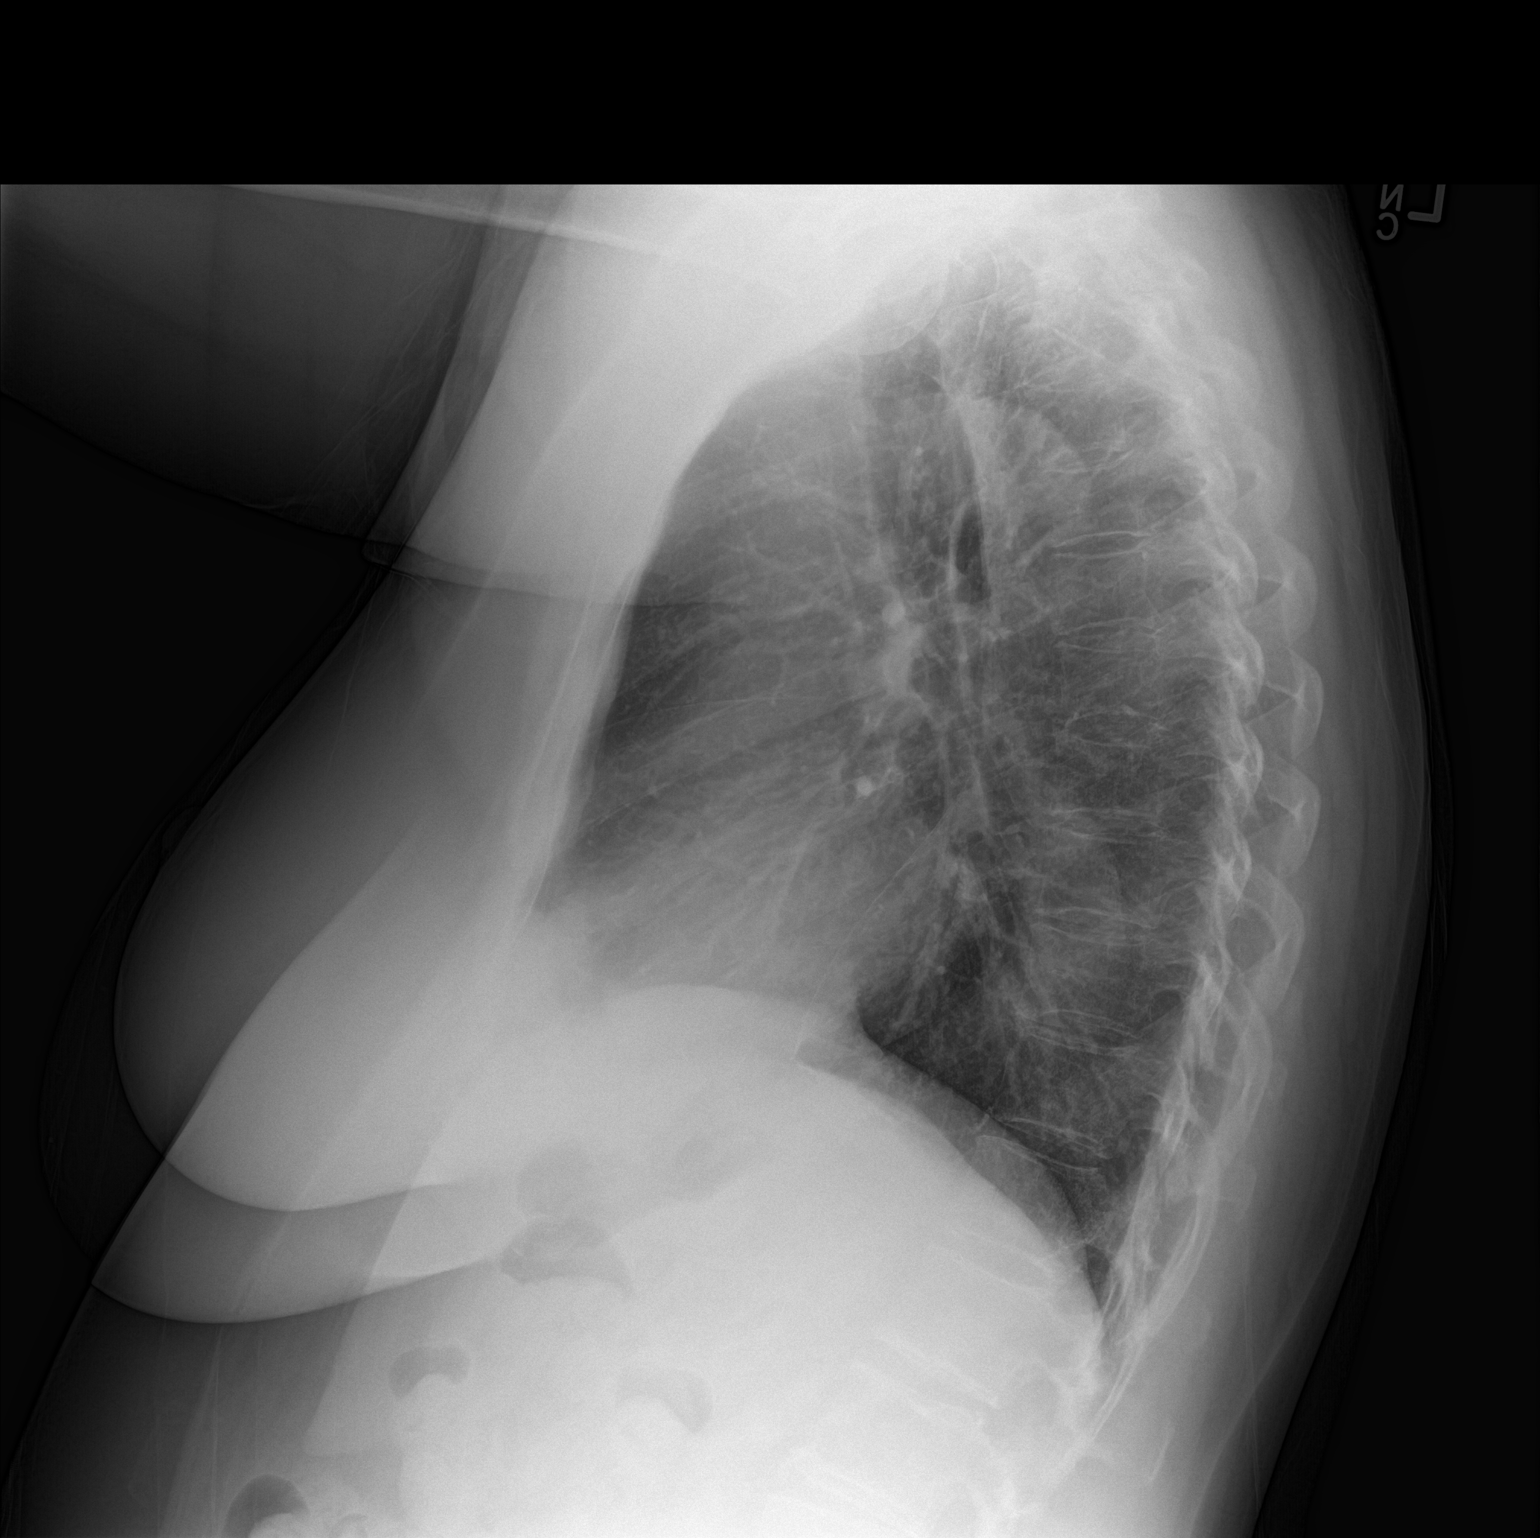

[2 of 2 positions shown; findings below may reference images not displayed]

FINDINGS: Upper normal heart size.

Tortuous aorta.

Mediastinal contours and pulmonary vascularity normal.

Lungs clear.

No pleural effusion or pneumothorax.

Osseous structures unremarkable.

Surgical clips in the cervical region bilaterally likely reflect
prior thyroidectomy.
IMPRESSION: No acute abnormalities pre

## 2017-01-17 ENCOUNTER — Encounter: Payer: Self-pay | Admitting: Internal Medicine

## 2017-01-18 ENCOUNTER — Other Ambulatory Visit: Payer: Self-pay | Admitting: Internal Medicine

## 2017-01-18 MED ORDER — ALPRAZOLAM 1 MG PO TABS: 1.0000 mg | ORAL_TABLET | Freq: Three times a day (TID) | ORAL | 2 refills | Status: DC

## 2017-01-19 ENCOUNTER — Other Ambulatory Visit: Payer: Self-pay | Admitting: Internal Medicine

## 2017-01-19 NOTE — Telephone Encounter (Signed)
Pt called in about this med.  She said that pharmacy doesn't have it  [facility] [place]

## 2017-01-19 NOTE — Telephone Encounter (Signed)
ALPRAZolam (XANAX) 1 MG tablet [[id number]]  Order Details  Dose: 1 mg Route: Oral Frequency: 3 times daily  Dispense Quantity:  90 tablet Refills:  2 Fills remaining:  --        Sig: Take 1 tablet (1 mg total) by mouth 3 (three) times daily.       Written Date:  01/18/17 Expiration Date:  07/17/17    Start Date:  01/18/17 End Date:  --         Ordering Provider:  -- DEA #:  -- NPI:  --   Authorizing Provider:  Cassandria Anger, MD DEA #:  [id number] NPI:  [id number]   Ordering User:  Cassandria Anger, MD            Original Order:  ALPRAZolam Duanne Moron) 1 MG tablet [[id number]]    [id number] [place] DEA #:  [id number]    Pharmacy Comments:  --       Fill quantity remaining:  -- Fill quantity used:  --            Order Class   Phone In   MD ok refill to be phone in not sure if it was done. [facility] spoke [facility]/Aaron gave MD authorization.../lm,b

## 2017-01-25 ENCOUNTER — Ambulatory Visit: Payer: Medicare Other | Admitting: Neurology

## 2017-02-16 ENCOUNTER — Other Ambulatory Visit: Payer: Self-pay | Admitting: Internal Medicine

## 2017-02-17 ENCOUNTER — Other Ambulatory Visit: Payer: Self-pay | Admitting: Internal Medicine

## 2017-02-19 ENCOUNTER — Encounter: Payer: Self-pay | Admitting: Internal Medicine

## 2017-02-19 NOTE — Telephone Encounter (Signed)
Routing to dr plotnikov, please advise, thanks 

## 2017-02-20 ENCOUNTER — Other Ambulatory Visit: Payer: Self-pay | Admitting: Internal Medicine

## 2017-02-26 DIAGNOSIS — J019 Acute sinusitis, unspecified: Secondary | ICD-10-CM | POA: Diagnosis not present

## 2017-02-27 ENCOUNTER — Encounter: Payer: Self-pay | Admitting: Internal Medicine

## 2017-02-27 NOTE — Progress Notes (Signed)
Abstracted and sent to scan  

## 2017-03-07 ENCOUNTER — Encounter: Payer: Self-pay | Admitting: Internal Medicine

## 2017-03-07 ENCOUNTER — Ambulatory Visit (INDEPENDENT_AMBULATORY_CARE_PROVIDER_SITE_OTHER): Payer: Medicare Other | Admitting: Internal Medicine

## 2017-03-07 ENCOUNTER — Telehealth: Payer: Self-pay | Admitting: Internal Medicine

## 2017-03-07 DIAGNOSIS — B370 Candidal stomatitis: Secondary | ICD-10-CM | POA: Diagnosis not present

## 2017-03-07 DIAGNOSIS — J019 Acute sinusitis, unspecified: Secondary | ICD-10-CM

## 2017-03-07 DIAGNOSIS — K209 Esophagitis, unspecified without bleeding: Secondary | ICD-10-CM

## 2017-03-07 MED ORDER — FLUCONAZOLE 100 MG PO TABS: ORAL_TABLET | ORAL | 1 refills | Status: DC

## 2017-03-07 NOTE — Assessment & Plan Note (Signed)
Diflucan Use [employer] Peroxicare tooth paste

## 2017-03-07 NOTE — Progress Notes (Signed)
Subjective:  Patient ID: Linda Hayes, female    DOB: 1951-01-20  Age: 66 y.o. MRN: [id number]  CC: Acute Visit (possible thrush in mouth causing sore throat, is on abt )   HPI Linda Hayes presents for URI last week - she went to [facility] and took Doxy. C/o ST, trouble swallowing since this weekend. Finished doxy 2 d ago  Outpatient Medications Prior to Visit  Medication Sig Dispense Refill  . ALPRAZolam (XANAX) 1 MG tablet Take 1 tablet (1 mg total) by mouth 3 (three) times daily. 90 tablet 2  . cholecalciferol (VITAMIN D) 1000 UNITS tablet Take 1,000 Units by mouth daily.      [name] ibuprofen (ADVIL,MOTRIN) 800 MG tablet Take 1 tablet (800 mg total) by mouth every 8 (eight) hours as needed. 60 tablet 0  . sertraline (ZOLOFT) 100 MG tablet TAKE 1 & 1/2 TABLET BY MOUTH DAILY 45 tablet 5  . SYNTHROID 125 MCG tablet TAKE 1 TABLET (125 MCG TOTAL) BY MOUTH DAILY. 30 tablet 5  . zolpidem (AMBIEN CR) 6.25 MG CR tablet TAKE 1 TABLET AT BEDTIME 30 tablet 1   No facility-administered medications prior to visit.     ROS Review of Systems  HENT: Positive for sore throat.   Cardiovascular: Positive for chest pain.  Neurological: Positive for tremors.    Objective:  BP 124/90 (BP Location: Left Arm, Patient Position: Sitting, Cuff Size: Normal)   Pulse 100   Temp 97.7 F (36.5 C) (Oral)   Resp 14   Ht 5\' 4"  (1.626 m)   Wt 206 lb (93.4 kg)   SpO2 99%   BMI 35.36 kg/m   BP Readings from Last 3 Encounters:  03/07/17 124/90  01/02/17 108/70  11/30/16 108/68    Wt Readings from Last 3 Encounters:  03/07/17 206 lb (93.4 kg)  01/02/17 209 lb (94.8 kg)  11/30/16 207 lb (93.9 kg)    Physical Exam  Constitutional: She appears well-developed. No distress.  HENT:  Head: Normocephalic.  Right Ear: External ear normal.  Left Ear: External ear normal.  Nose: Nose normal.  Mouth/Throat: Oropharynx is clear and moist.  Eyes: Pupils are equal, round, and reactive to light. Conjunctivae are  normal. Right eye exhibits no discharge. Left eye exhibits no discharge.  Neck: Normal range of motion. Neck supple. No JVD present. No tracheal deviation present. No thyromegaly present.  Cardiovascular: Normal rate, regular rhythm and normal heart sounds.   Pulmonary/Chest: No stridor. No respiratory distress. She has no wheezes.  Abdominal: Soft. Bowel sounds are normal. She exhibits no distension and no mass. There is no tenderness. There is no rebound and no guarding.  Musculoskeletal: She exhibits no edema or tenderness.  Lymphadenopathy:    She has no cervical adenopathy.  Neurological: She displays normal reflexes. No cranial nerve deficit. She exhibits normal muscle tone. Coordination abnormal.  Skin: No rash noted. No erythema.  Psychiatric: She has a normal mood and affect. Her behavior is normal. Judgment and thought content normal.   Thrush  Lab Results  Component Value Date   WBC 7.8 01/02/2017   HGB 14.8 01/02/2017   HCT 42.8 01/02/2017   PLT 330.0 01/02/2017   GLUCOSE 98 01/02/2017   CHOL 228 (H) 01/02/2017   TRIG 257.0 (H) 01/02/2017   HDL 35.60 (L) 01/02/2017   LDLDIRECT 142.0 01/02/2017   ALT 17 01/02/2017   AST 19 01/02/2017   NA 142 01/02/2017   K 4.3 01/02/2017   CL 106 01/02/2017  CREATININE 0.84 01/02/2017   BUN 13 01/02/2017   CO2 28 01/02/2017   TSH 0.24 (L) 01/02/2017    No results found.  Assessment & Plan:   There are no diagnoses linked to this encounter. I am having Ms. Huebsch maintain her cholecalciferol, ibuprofen, SYNTHROID, ALPRAZolam, sertraline, zolpidem, and doxycycline.  Meds ordered this encounter  Medications  . doxycycline (VIBRA-TABS) 100 MG tablet    Sig: TAKE 1 TABLET (100 MG TOTAL) BY MOUTH 2 (TWO) TIMES A DAY FOR 7 DAYS. NOT FOR PREGNANCY/LACTATION    Refill:  0     Follow-up: No Follow-up on file.  Walker Kehr, MD

## 2017-03-07 NOTE — Assessment & Plan Note (Addendum)
Probable OTC Nexium qd Diflucan

## 2017-03-07 NOTE — Patient Instructions (Signed)
Use [employer] Peroxicare tooth paste Nexium OTC x 1-2 weeks

## 2017-03-07 NOTE — Telephone Encounter (Signed)
[facility] - Client TELEPHONE [facility]  Patient Name: Linda Hayes  DOB: 1951/02/29    Initial Comment Caller states her tongue is burning and it is hard to swallow.    Nurse Assessment  Nurse: Harlow Mares, RN, Suanne Marker Date/Time (Eastern Time): 03/07/2017 7:57:00 AM  Confirm and document reason for call. If symptomatic, describe symptoms. ---Caller states her tongue is burning and it is hard to swallow. Had cold last week and was seen in [facility] last week, Doxycycline 100mg , she has completed. Her tongue became sore with white patches and her throat feels raw. Reports that she feels really bad this morning and having a hard time swallowing.  Does the patient have any new or worsening symptoms? ---Yes  Will a triage be completed? ---Yes  Related visit to physician within the last 2 weeks? ---Yes  Does the PT have any chronic conditions? (i.e. diabetes, asthma, etc.) ---Yes  List chronic conditions. ---hypothyroidism; anxiety/depression;  Is this a behavioral health or substance abuse call? ---No     Guidelines    Guideline Title Affirmed Question Affirmed Notes  Sore Throat SEVERE (e.g., excruciating) throat pain    Final Disposition User   See Physician within 24 Hours [place], RN, Suanne Marker    Comments  Appt scheduled for 3pm today with Dr. Alain Marion at the [place] office.   Referrals  REFERRED TO PCP OFFICE   Disagree/Comply: Comply

## 2017-03-08 ENCOUNTER — Encounter: Payer: Self-pay | Admitting: Internal Medicine

## 2017-03-08 NOTE — Assessment & Plan Note (Signed)
Recent - resolved Probiotic

## 2017-03-20 ENCOUNTER — Other Ambulatory Visit: Payer: Self-pay | Admitting: Internal Medicine

## 2017-03-21 ENCOUNTER — Telehealth: Payer: Self-pay | Admitting: Internal Medicine

## 2017-03-21 MED ORDER — ZOLPIDEM TARTRATE ER 6.25 MG PO TBCR: 6.2500 mg | EXTENDED_RELEASE_TABLET | Freq: Every day | ORAL | 3 refills | Status: DC

## 2017-03-21 NOTE — Telephone Encounter (Signed)
Patient is requesting [facility]ill on [facility] [place]

## 2017-03-21 NOTE — Telephone Encounter (Signed)
OK to fill this/these prescription(s) with additional refills x3  Thank you!  

## 2017-03-21 NOTE — Telephone Encounter (Signed)
Check [place] registry [facility] pls advise if ok to refill,.../lmb

## 2017-03-21 NOTE — Telephone Encounter (Signed)
Notified pt refill has been approved [facility]...Linda Hayes

## 2017-04-17 ENCOUNTER — Other Ambulatory Visit: Payer: Self-pay | Admitting: General Practice

## 2017-04-17 ENCOUNTER — Other Ambulatory Visit: Payer: Self-pay | Admitting: Internal Medicine

## 2017-04-17 MED ORDER — SYNTHROID 125 MCG PO TABS: ORAL_TABLET | ORAL | 5 refills | Status: DC

## 2017-04-18 ENCOUNTER — Other Ambulatory Visit: Payer: Self-pay | Admitting: Internal Medicine

## 2017-04-19 NOTE — Telephone Encounter (Signed)
Check [place] registry last filled 03/20/2017...Linda Hayes

## 2017-04-20 ENCOUNTER — Encounter: Payer: Self-pay | Admitting: Internal Medicine

## 2017-04-20 NOTE — Telephone Encounter (Signed)
Pt called regarding this, was informed we are waiting on approval.

## 2017-04-23 NOTE — Telephone Encounter (Signed)
MD ok [facility],,/lmb

## 2017-04-23 NOTE — Telephone Encounter (Signed)
MD approved refill see attached msg on the email 04/20/17. [facility] w/Dahl[facility] pharmacist gave MD authorization...Johny Chess   Plotnikov, Evie Lacks, MD  to Karren Cobble, CMA       11:46 PM  OK to fill this/these prescription(s) with additional refills x3   Thank you!

## 2017-04-23 NOTE — Telephone Encounter (Signed)
Pt called back checking on this. She said that she is on her last pill. She is needing this sent as soon as possible.

## 2017-05-23 DIAGNOSIS — Z23 Encounter for immunization: Secondary | ICD-10-CM | POA: Diagnosis not present

## 2017-07-15 ENCOUNTER — Other Ambulatory Visit: Payer: Self-pay | Admitting: Internal Medicine

## 2017-07-17 NOTE — Telephone Encounter (Signed)
ERROR

## 2017-07-30 ENCOUNTER — Other Ambulatory Visit: Payer: Self-pay | Admitting: Internal Medicine

## 2017-08-14 ENCOUNTER — Other Ambulatory Visit: Payer: Self-pay | Admitting: Internal Medicine

## 2017-08-15 NOTE — Telephone Encounter (Signed)
Routing to dr plotnikov, please advise, thanks 

## 2017-09-12 ENCOUNTER — Other Ambulatory Visit: Payer: Self-pay | Admitting: Internal Medicine

## 2017-10-01 ENCOUNTER — Encounter: Payer: Self-pay | Admitting: Internal Medicine

## 2017-10-01 ENCOUNTER — Ambulatory Visit (INDEPENDENT_AMBULATORY_CARE_PROVIDER_SITE_OTHER): Payer: Medicare Other | Admitting: Internal Medicine

## 2017-10-01 DIAGNOSIS — G245 Blepharospasm: Secondary | ICD-10-CM

## 2017-10-01 DIAGNOSIS — E890 Postprocedural hypothyroidism: Secondary | ICD-10-CM

## 2017-10-01 DIAGNOSIS — J019 Acute sinusitis, unspecified: Secondary | ICD-10-CM

## 2017-10-01 MED ORDER — CEFDINIR 300 MG PO CAPS
300.0000 mg | ORAL_CAPSULE | Freq: Two times a day (BID) | ORAL | 0 refills | Status: DC
Start: 2017-10-01 — End: 2017-10-05

## 2017-10-01 NOTE — Progress Notes (Signed)
Subjective:  Patient ID: Linda Hayes, female    DOB: 01-23-1951  Age: 67 y.o. MRN: [id number]  CC: No chief complaint on file.   HPI Linda Hayes presents for sinusitis sx's x 2 weeks  Outpatient Medications Prior to Visit  Medication Sig Dispense Refill  . ALPRAZolam (XANAX) 1 MG tablet Take 1 tablet (1 mg total) by mouth 3 (three) times daily as needed. 90 tablet 5  . cholecalciferol (VITAMIN D) 1000 UNITS tablet Take 1,000 Units by mouth daily.      [name] ibuprofen (ADVIL,MOTRIN) 800 MG tablet Take 1 tablet (800 mg total) by mouth every 8 (eight) hours as needed. 60 tablet 0  . sertraline (ZOLOFT) 100 MG tablet Take 1.5 tablets (150 mg total) by mouth daily. Annual appt due in May must see provider for future refills 45 tablet 3  . SYNTHROID 125 MCG tablet TAKE 1 TABLET (125 MCG TOTAL) BY MOUTH DAILY. 30 tablet 5  . zolpidem (AMBIEN CR) 6.25 MG CR tablet TAKE 1 TABLET AT BEDTIME 30 tablet 5  . doxycycline (VIBRA-TABS) 100 MG tablet TAKE 1 TABLET (100 MG TOTAL) BY MOUTH 2 (TWO) TIMES A DAY FOR 7 DAYS. NOT FOR PREGNANCY/LACTATION  0  . fluconazole (DIFLUCAN) 100 MG tablet Take 2 tabs on day#1, then 1 tab daily on Days #2-10 11 tablet 1   No facility-administered medications prior to visit.     ROS Review of Systems  Constitutional: Positive for fatigue. Negative for activity change, appetite change, chills and unexpected weight change.  HENT: Positive for congestion, postnasal drip and sinus pressure. Negative for mouth sores.   Eyes: Negative for visual disturbance.  Respiratory: Negative for cough and chest tightness.   Gastrointestinal: Negative for abdominal pain and nausea.  Genitourinary: Negative for difficulty urinating, frequency and vaginal pain.  Musculoskeletal: Negative for back pain and gait problem.  Skin: Negative for pallor and rash.  Neurological: Positive for headaches. Negative for dizziness, tremors, weakness and numbness.  Psychiatric/Behavioral: Negative  for confusion and sleep disturbance.    Objective:  BP 106/72 (BP Location: Left Arm, Patient Position: Sitting, Cuff Size: Large)   Pulse (!) 102   Temp 98 F (36.7 C) (Oral)   Ht 5\' 4"  (1.626 m)   Wt 211 lb (95.7 kg)   SpO2 99%   BMI 36.22 kg/m   BP Readings from Last 3 Encounters:  10/01/17 106/72  03/07/17 124/90  01/02/17 108/70    Wt Readings from Last 3 Encounters:  10/01/17 211 lb (95.7 kg)  03/07/17 206 lb (93.4 kg)  01/02/17 209 lb (94.8 kg)    Physical Exam  Constitutional: She appears well-developed. No distress.  HENT:  Head: Normocephalic.  Right Ear: External ear normal.  Left Ear: External ear normal.  Nose: Nose normal.  Mouth/Throat: Oropharynx is clear and moist.  Eyes: Conjunctivae are normal. Pupils are equal, round, and reactive to light. Right eye exhibits no discharge. Left eye exhibits no discharge.  Neck: Normal range of motion. Neck supple. No JVD present. No tracheal deviation present. No thyromegaly present.  Cardiovascular: Normal rate, regular rhythm and normal heart sounds.  Pulmonary/Chest: No stridor. No respiratory distress. She has no wheezes.  Abdominal: Soft. Bowel sounds are normal. She exhibits no distension and no mass. There is no tenderness. There is no rebound and no guarding.  Musculoskeletal: She exhibits no edema or tenderness.  Lymphadenopathy:    She has no cervical adenopathy.  Neurological: She displays normal reflexes. No cranial nerve  deficit. She exhibits normal muscle tone. Coordination normal.  Skin: No rash noted. No erythema.  Psychiatric: She has a normal mood and affect. Her behavior is normal. Judgment and thought content normal.  eryth nares Head tremor  Lab Results  Component Value Date   WBC 7.8 01/02/2017   HGB 14.8 01/02/2017   HCT 42.8 01/02/2017   PLT 330.0 01/02/2017   GLUCOSE 98 01/02/2017   CHOL 228 (H) 01/02/2017   TRIG 257.0 (H) 01/02/2017   HDL 35.60 (L) 01/02/2017   LDLDIRECT 142.0  01/02/2017   ALT 17 01/02/2017   AST 19 01/02/2017   NA 142 01/02/2017   K 4.3 01/02/2017   CL 106 01/02/2017   CREATININE 0.84 01/02/2017   BUN 13 01/02/2017   CO2 28 01/02/2017   TSH 0.24 (L) 01/02/2017    No results found.  Assessment & Plan:   There are no diagnoses linked to this encounter. I have discontinued Glynis Smiles. Gruner's doxycycline and fluconazole. I am also having her maintain her cholecalciferol, ibuprofen, SYNTHROID, zolpidem, ALPRAZolam, and sertraline.  No orders of the defined types were placed in this encounter.    Follow-up: No Follow-up on file.  Walker Kehr, MD

## 2017-10-01 NOTE — Assessment & Plan Note (Signed)
On Rx 

## 2017-10-01 NOTE — Patient Instructions (Signed)
You can use over-the-counter  "cold" medicines  such as  "Tylenol cold" , "Advil cold",  "Mucinex" or" Mucinex D"  for cough and congestion.   Avoid decongestants if you have high blood pressure and use "Afrin" nasal spray for nasal congestion as directed. Use " Delssym" or" Robitussin" cough syrup varietis for cough.  You can use plain "Tylenol" or "Advil" for fever, chills and achyness. Use Halls or Ricola cough drops.   Please, make an appointment if you are not better or if you're worse.

## 2017-10-01 NOTE — Assessment & Plan Note (Signed)
Levothroid 

## 2017-10-01 NOTE — Assessment & Plan Note (Signed)
Cefdinir po Allergies to Rx discussed PCN allergy noted

## 2017-10-05 ENCOUNTER — Encounter: Payer: Self-pay | Admitting: Internal Medicine

## 2017-10-05 MED ORDER — AZITHROMYCIN 250 MG PO TABS: ORAL_TABLET | ORAL | 1 refills | Status: DC

## 2017-10-05 NOTE — Telephone Encounter (Signed)
Please advise in Dr. Plotnikovs absence 

## 2017-10-30 ENCOUNTER — Other Ambulatory Visit: Payer: Self-pay | Admitting: Internal Medicine

## 2018-01-01 ENCOUNTER — Other Ambulatory Visit: Payer: Self-pay | Admitting: Internal Medicine

## 2018-01-02 ENCOUNTER — Other Ambulatory Visit: Payer: Self-pay | Admitting: Internal Medicine

## 2018-01-07 ENCOUNTER — Encounter: Payer: Self-pay | Admitting: Internal Medicine

## 2018-01-08 NOTE — Telephone Encounter (Signed)
Notified pt [facility] has [facility]

## 2018-01-08 NOTE — Telephone Encounter (Signed)
Pt is down to 2 pills.

## 2018-01-08 NOTE — Telephone Encounter (Signed)
MD is out of the office this week. Check [place] registry last filled 12/04/2017 pls advise on refill.Marland KitchenJohny Hayes

## 2018-01-22 ENCOUNTER — Encounter: Payer: Self-pay | Admitting: Internal Medicine

## 2018-01-22 ENCOUNTER — Other Ambulatory Visit (INDEPENDENT_AMBULATORY_CARE_PROVIDER_SITE_OTHER): Payer: Medicare Other

## 2018-01-22 ENCOUNTER — Ambulatory Visit (INDEPENDENT_AMBULATORY_CARE_PROVIDER_SITE_OTHER): Payer: Medicare Other | Admitting: Internal Medicine

## 2018-01-22 VITALS — BP 106/68 | HR 89 | Temp 97.8°F | Ht 64.0 in | Wt 211.0 lb

## 2018-01-22 DIAGNOSIS — E890 Postprocedural hypothyroidism: Secondary | ICD-10-CM | POA: Diagnosis not present

## 2018-01-22 DIAGNOSIS — F411 Generalized anxiety disorder: Secondary | ICD-10-CM

## 2018-01-22 DIAGNOSIS — G4700 Insomnia, unspecified: Secondary | ICD-10-CM

## 2018-01-22 DIAGNOSIS — G245 Blepharospasm: Secondary | ICD-10-CM

## 2018-01-22 DIAGNOSIS — Z23 Encounter for immunization: Secondary | ICD-10-CM

## 2018-01-22 DIAGNOSIS — E785 Hyperlipidemia, unspecified: Secondary | ICD-10-CM

## 2018-01-22 LAB — LIPID PANEL
Cholesterol: 215 mg/dL — ABNORMAL HIGH (ref 0–200)
HDL: 29.4 mg/dL — AB (ref >39.00)
NonHDL: 185.91
Total CHOL/HDL Ratio: 7
Triglycerides: 309 mg/dL — ABNORMAL HIGH (ref 0.0–149.0)
VLDL: 61.8 mg/dL — ABNORMAL HIGH (ref 0.0–40.0)

## 2018-01-22 LAB — CBC WITH DIFFERENTIAL/PLATELET
BASOS PCT: 1 % (ref 0.0–3.0)
Basophils Absolute: 0.1 10*3/uL (ref 0.0–0.1)
EOS PCT: 2.4 % (ref 0.0–5.0)
Eosinophils Absolute: 0.2 10*3/uL (ref 0.0–0.7)
HEMATOCRIT: 43.1 % (ref 36.0–46.0)
HEMOGLOBIN: 14.9 g/dL (ref 12.0–15.0)
LYMPHS PCT: 35.9 % (ref 12.0–46.0)
Lymphs Abs: 2.9 10*3/uL (ref 0.7–4.0)
MCHC: 34.5 g/dL (ref 30.0–36.0)
MCV: 94.5 fl (ref 78.0–100.0)
MONO ABS: 0.6 10*3/uL (ref 0.1–1.0)
Monocytes Relative: 6.9 % (ref 3.0–12.0)
Neutro Abs: 4.3 10*3/uL (ref 1.4–7.7)
Neutrophils Relative %: 53.8 % (ref 43.0–77.0)
PLATELETS: 340 10*3/uL (ref 150.0–400.0)
RBC: 4.56 Mil/uL (ref 3.87–5.11)
RDW: 13.2 % (ref 11.5–15.5)
WBC: 8 10*3/uL (ref 4.0–10.5)

## 2018-01-22 LAB — HEPATIC FUNCTION PANEL
ALT: 15 U/L (ref 0–35)
AST: 18 U/L (ref 0–37)
Albumin: 4.1 g/dL (ref 3.5–5.2)
Alkaline Phosphatase: 79 U/L (ref 39–117)
BILIRUBIN TOTAL: 0.5 mg/dL (ref 0.2–1.2)
Bilirubin, Direct: 0.1 mg/dL (ref 0.0–0.3)
TOTAL PROTEIN: 7.4 g/dL (ref 6.0–8.3)

## 2018-01-22 LAB — T4, FREE: FREE T4: 1.19 ng/dL (ref 0.60–1.60)

## 2018-01-22 LAB — BASIC METABOLIC PANEL
BUN: 14 mg/dL (ref 6–23)
CO2: 27 mEq/L (ref 19–32)
Calcium: 9.2 mg/dL (ref 8.4–10.5)
Chloride: 105 mEq/L (ref 96–112)
Creatinine, Ser: 0.9 mg/dL (ref 0.40–1.20)
GFR: 66.6 mL/min (ref >60.00)
GLUCOSE: 96 mg/dL (ref 70–99)
POTASSIUM: 3.9 meq/L (ref 3.5–5.1)
SODIUM: 140 meq/L (ref 135–145)

## 2018-01-22 LAB — URINALYSIS, ROUTINE W REFLEX MICROSCOPIC
Bilirubin Urine: NEGATIVE
KETONES UR: NEGATIVE
NITRITE: NEGATIVE
Specific Gravity, Urine: 1.015 (ref 1.000–1.030)
Total Protein, Urine: NEGATIVE
Urine Glucose: NEGATIVE
Urobilinogen, UA: 0.2 (ref 0.0–1.0)
pH: 6.5 (ref 5.0–8.0)

## 2018-01-22 LAB — TSH: TSH: 0.22 u[IU]/mL — ABNORMAL LOW (ref 0.35–4.50)

## 2018-01-22 LAB — LDL CHOLESTEROL, DIRECT: LDL DIRECT: 138 mg/dL

## 2018-01-22 MED ORDER — SERTRALINE HCL 100 MG PO TABS: ORAL_TABLET | ORAL | 3 refills | Status: DC

## 2018-01-22 NOTE — Assessment & Plan Note (Signed)
Sleeping ok w/Rx

## 2018-01-22 NOTE — Assessment & Plan Note (Signed)
labs

## 2018-01-22 NOTE — Assessment & Plan Note (Signed)
Discussed CBD

## 2018-01-22 NOTE — Progress Notes (Signed)
Subjective:  Patient ID: Linda Hayes, female    DOB: 1951-06-27  Age: 67 y.o. MRN: [id number]  CC: No chief complaint on file.    HPI Linda Hayes presents for anxiety, tics, insomnia f/u  Outpatient Medications Prior to Visit  Medication Sig Dispense Refill  . ALPRAZolam (XANAX) 1 MG tablet Take 1 tablet (1 mg total) by mouth 3 (three) times daily as needed. 90 tablet 5  . azithromycin (ZITHROMAX Z-PAK) 250 MG tablet 2 tab by mouth day 1, then 1 per day 6 tablet 1  . cholecalciferol (VITAMIN D) 1000 UNITS tablet Take 1,000 Units by mouth daily.      [name] ibuprofen (ADVIL,MOTRIN) 800 MG tablet Take 1 tablet (800 mg total) by mouth every 8 (eight) hours as needed. 60 tablet 0  . sertraline (ZOLOFT) 100 MG tablet TAKE 1.5 TABLETS BY MOUTH DAILY. ANNUAL APPT DUE IN MAY MUST SEE PROVIDER FOR FUTURE REFILLS 45 tablet 3  . SYNTHROID 125 MCG tablet TAKE 1 TABLET BY MOUTH EVERY DAY 30 tablet 11  . zolpidem (AMBIEN CR) 6.25 MG CR tablet TAKE 1 TABLET BY MOUTH EVERYDAY AT BEDTIME 30 tablet 0   No facility-administered medications prior to visit.     ROS: Review of Systems  Constitutional: Negative for activity change, appetite change, chills, fatigue and unexpected weight change.  HENT: Negative for congestion, mouth sores and sinus pressure.   Eyes: Negative for visual disturbance.  Respiratory: Negative for cough and chest tightness.   Gastrointestinal: Negative for abdominal pain and nausea.  Genitourinary: Negative for difficulty urinating, frequency and vaginal pain.  Musculoskeletal: Negative for back pain and gait problem.  Skin: Negative for pallor and rash.  Neurological: Positive for tremors and weakness. Negative for dizziness, numbness and headaches.  Psychiatric/Behavioral: Negative for confusion, sleep disturbance and suicidal ideas. The patient is nervous/anxious.     Objective:  BP 106/68 (BP Location: Left Arm, Patient Position: Sitting, Cuff Size: Large)   Pulse 89    Temp 97.8 F (36.6 C) (Oral)   Ht 5\' 4"  (1.626 m)   Wt 211 lb (95.7 kg)   SpO2 98%   BMI 36.22 kg/m   BP Readings from Last 3 Encounters:  01/22/18 106/68  10/01/17 106/72  03/07/17 124/90    Wt Readings from Last 3 Encounters:  01/22/18 211 lb (95.7 kg)  10/01/17 211 lb (95.7 kg)  03/07/17 206 lb (93.4 kg)    Physical Exam  Constitutional: She appears well-developed. No distress.  HENT:  Head: Normocephalic.  Right Ear: External ear normal.  Left Ear: External ear normal.  Nose: Nose normal.  Mouth/Throat: Oropharynx is clear and moist.  Eyes: Pupils are equal, round, and reactive to light. Conjunctivae are normal. Right eye exhibits no discharge. Left eye exhibits no discharge.  Neck: Normal range of motion. Neck supple. No JVD present. No tracheal deviation present. No thyromegaly present.  Cardiovascular: Normal rate, regular rhythm and normal heart sounds.  Pulmonary/Chest: No stridor. No respiratory distress. She has no wheezes.  Abdominal: Soft. Bowel sounds are normal. She exhibits no distension and no mass. There is no tenderness. There is no rebound and no guarding.  Musculoskeletal: She exhibits no edema or tenderness.  Lymphadenopathy:    She has no cervical adenopathy.  Neurological: She displays normal reflexes. No cranial nerve deficit. She exhibits normal muscle tone. Coordination abnormal.  Skin: No rash noted. No erythema.  Psychiatric: She has a normal mood and affect. Her behavior is normal. Judgment and  thought content normal.  face, head tics  Lab Results  Component Value Date   WBC 7.8 01/02/2017   HGB 14.8 01/02/2017   HCT 42.8 01/02/2017   PLT 330.0 01/02/2017   GLUCOSE 98 01/02/2017   CHOL 228 (H) 01/02/2017   TRIG 257.0 (H) 01/02/2017   HDL 35.60 (L) 01/02/2017   LDLDIRECT 142.0 01/02/2017   ALT 17 01/02/2017   AST 19 01/02/2017   NA 142 01/02/2017   K 4.3 01/02/2017   CL 106 01/02/2017   CREATININE 0.84 01/02/2017   BUN 13  01/02/2017   CO2 28 01/02/2017   TSH 0.24 (L) 01/02/2017    No results found.  Assessment & Plan:   There are no diagnoses linked to this encounter.   No orders of the defined types were placed in this encounter.    Follow-up: No follow-ups on file.  Walker Kehr, MD

## 2018-01-22 NOTE — Assessment & Plan Note (Signed)
Labs

## 2018-01-22 NOTE — Assessment & Plan Note (Signed)
Xanax prn  Potential benefits of a long term benzodiazepines  use as well as potential risks  and complications were explained to the patient and were aknowledged. 

## 2018-01-22 NOTE — Patient Instructions (Addendum)
MC well w/Jill 

## 2018-02-03 ENCOUNTER — Other Ambulatory Visit: Payer: Self-pay | Admitting: Internal Medicine

## 2018-02-03 DIAGNOSIS — N390 Urinary tract infection, site not specified: Secondary | ICD-10-CM

## 2018-02-08 ENCOUNTER — Other Ambulatory Visit: Payer: Self-pay | Admitting: Internal Medicine

## 2018-02-08 DIAGNOSIS — R8290 Unspecified abnormal findings in urine: Secondary | ICD-10-CM

## 2018-02-11 ENCOUNTER — Other Ambulatory Visit (INDEPENDENT_AMBULATORY_CARE_PROVIDER_SITE_OTHER): Payer: Medicare Other

## 2018-02-11 DIAGNOSIS — R8290 Unspecified abnormal findings in urine: Secondary | ICD-10-CM | POA: Diagnosis not present

## 2018-02-11 LAB — URINALYSIS, ROUTINE W REFLEX MICROSCOPIC
Bilirubin Urine: NEGATIVE
KETONES UR: NEGATIVE
Nitrite: POSITIVE — AB
RBC / HPF: NONE SEEN (ref 0–?)
SPECIFIC GRAVITY, URINE: 1.01 (ref 1.000–1.030)
TOTAL PROTEIN, URINE-UPE24: NEGATIVE
URINE GLUCOSE: NEGATIVE
UROBILINOGEN UA: 0.2 (ref 0.0–1.0)
pH: 8 (ref 5.0–8.0)

## 2018-02-12 ENCOUNTER — Encounter: Payer: Self-pay | Admitting: Internal Medicine

## 2018-02-13 LAB — CULTURE, URINE COMPREHENSIVE
MICRO NUMBER: 90781132
SPECIMEN QUALITY:: ADEQUATE

## 2018-02-15 ENCOUNTER — Other Ambulatory Visit: Payer: Self-pay | Admitting: Internal Medicine

## 2018-02-15 MED ORDER — SULFAMETHOXAZOLE-TRIMETHOPRIM 800-160 MG PO TABS: 1.0000 | ORAL_TABLET | Freq: Two times a day (BID) | ORAL | 0 refills | Status: DC

## 2018-02-17 ENCOUNTER — Encounter: Payer: Self-pay | Admitting: Internal Medicine

## 2018-02-22 ENCOUNTER — Ambulatory Visit
Admission: RE | Admit: 2018-02-22 | Discharge: 2018-02-22 | Disposition: A | Discharge status: Home / Self Care | Payer: Medicare Other | Source: Ambulatory Visit | Attending: Internal Medicine | Admitting: Internal Medicine

## 2018-02-22 DIAGNOSIS — N390 Urinary tract infection, site not specified: Secondary | ICD-10-CM

## 2018-02-24 ENCOUNTER — Encounter: Payer: Self-pay | Admitting: Internal Medicine

## 2018-03-15 ENCOUNTER — Other Ambulatory Visit: Payer: Self-pay

## 2018-06-01 ENCOUNTER — Other Ambulatory Visit: Payer: Self-pay | Admitting: Internal Medicine

## 2018-06-03 DIAGNOSIS — Z23 Encounter for immunization: Secondary | ICD-10-CM | POA: Diagnosis not present

## 2018-07-31 ENCOUNTER — Other Ambulatory Visit: Payer: Self-pay | Admitting: Internal Medicine

## 2018-08-29 ENCOUNTER — Other Ambulatory Visit: Payer: Self-pay | Admitting: Internal Medicine

## 2018-08-29 NOTE — Telephone Encounter (Signed)
Copied from [place] [id number]. Topic: Quick Communication - Rx Refill/Question >> Aug 29, 2018 11:19 AM Windy Kalata wrote: Medication: sertraline (ZOLOFT) 100 MG tablet  Has the patient contacted their pharmacy? Yes.   (Agent: If no, request that the patient contact the pharmacy for the refill.) (Agent: If yes, when and what did the pharmacy advise?) States she needs an appt but she had her CPE in 6/19 so not due for another one, please advise  Preferred Pharmacy (with phone number or street name): [id number] [place], [place] [facility] [address] [phone] (Phone) [phone] (Fax)    Agent: Please be advised that RX refills may take up to 3 business days. We ask that you follow-up with your pharmacy.

## 2018-08-29 NOTE — Telephone Encounter (Signed)
Requested medication (s) are due for refill today: yes  Requested medication (s) are on the active medication list: yes  Last refill:  01/22/18  Future visit scheduled: no  Notes to clinic:  Called pt to make appointment -mailbox was full and unable to take messages   Requested Prescriptions  Pending Prescriptions Disp Refills   sertraline (ZOLOFT) 100 MG tablet 45 tablet 3    Sig: TAKE 1.5 TABLETS BY MOUTH DAILY. ANNUAL APPT DUE IN MAY MUST SEE PROVIDER FOR FUTURE REFILLS     Psychiatry:  Antidepressants - SSRI Failed - 08/29/2018 11:45 AM      Failed - Valid encounter within last 6 months    Recent Outpatient Visits          7 months ago Need for pneumococcal vaccination   [place] Plotnikov, Evie Lacks, MD   11 months ago Acute sinusitis, recurrence not specified, unspecified location   [place], Evie Lacks, MD   1 year ago [place], Evie Lacks, MD   1 year ago Well adult exam   [place], Evie Lacks, MD   2 years ago Postoperative hypothyroidism   [facility] Plotnikov, Evie Lacks, MD

## 2018-09-26 ENCOUNTER — Other Ambulatory Visit: Payer: Self-pay | Admitting: Internal Medicine

## 2018-09-26 NOTE — Telephone Encounter (Signed)
[place] Controlled Substance Database checked. Last filled on 08/29/18  Last OV 01/22/18

## 2018-09-27 NOTE — Telephone Encounter (Signed)
Pt called to check on RX. I advised her she was supposed to have 6 month f/u in Dec. Scheduled appt for 11/11/2018.

## 2018-09-30 NOTE — Telephone Encounter (Signed)
Patient called in for an update on med refill. Advised patient of the protocol of 3 business days she stated she doesn't understand why she has to wait for meds that she has been on for years. I advised patient to call her pharmacy on tomorrow to see if it is ready for her which marks 3 rd business day from date she called it in on 09/26/2018

## 2018-09-30 NOTE — Telephone Encounter (Signed)
Requested medication (s) are due for refill today: Yes  Requested medication (s) are on the active medication list: Yes  Last refill:  06/02/18 and 08/01/18  Future visit scheduled: Yes  Notes to clinic:  Unable to refill per protocol. Patient is asking to speak to a CMA about this request. See notes.     Requested Prescriptions  Pending Prescriptions Disp Refills   zolpidem (AMBIEN CR) 6.25 MG CR tablet [Pharmacy Med Name: ZOLPIDEM TART ER 6.25 MG TAB] 30 tablet 0    Sig: TAKE 1 TABLET BY MOUTH EVERYDAY AT BEDTIME     Not Delegated - Psychiatry:  Anxiolytics/Hypnotics Failed - 09/30/2018  9:38 AM      Failed - This refill cannot be delegated      Failed - Urine Drug Screen completed in last 360 days.      Failed - Valid encounter within last 6 months    Recent Outpatient Visits          8 months ago Need for pneumococcal vaccination   [place] Plotnikov, Evie Lacks, MD   12 months ago Acute sinusitis, recurrence not specified, unspecified location   [place] Plotnikov, Evie Lacks, MD   1 year ago [place] Plotnikov, Evie Lacks, MD   1 year ago Well adult exam   [facility] Plotnikov, Evie Lacks, MD   2 years ago Postoperative hypothyroidism   [place], MD      Future Appointments            In 1 month Plotnikov, Evie Lacks, MD [place], [facility]          ALPRAZolam Duanne Moron) 1 MG tablet [Pharmacy Med Name: ALPRAZOLAM 1 MG TABLET] 90 tablet 0    Sig: TAKE 1 TABLET (1 MG TOTAL) BY MOUTH 3 (THREE) TIMES DAILY AS NEEDED.     Not Delegated - Psychiatry:  Anxiolytics/Hypnotics Failed - 09/30/2018  9:38 AM      Failed - This refill cannot be delegated      Failed - Urine Drug Screen completed in last 360 days.      Failed - Valid encounter within last 6 months    Recent Outpatient  Visits          8 months ago Need for pneumococcal vaccination   [place] Plotnikov, Evie Lacks, MD   12 months ago Acute sinusitis, recurrence not specified, unspecified location   [facility] Plotnikov, Evie Lacks, MD   1 year ago [place], Evie Lacks, MD   1 year ago Well adult exam   [place], Evie Lacks, MD   2 years ago Postoperative hypothyroidism   [place], Evie Lacks, MD      Future Appointments            In 1 month Plotnikov, Evie Lacks, MD [place], [place]

## 2018-09-30 NOTE — Telephone Encounter (Signed)
Patient called and said that her medication had not been send yet to her pharmacy. I told her that she ask for the refill on Friday and the turn around it 2-3 business days. She said no one had told her that. She also said that she wanted to talk to a CMA regarding this because she can not go without her medication.

## 2018-10-01 MED ORDER — ALPRAZOLAM 1 MG PO TABS: 1.0000 mg | ORAL_TABLET | Freq: Three times a day (TID) | ORAL | 5 refills | Status: DC | PRN

## 2018-10-01 MED ORDER — ZOLPIDEM TARTRATE ER 6.25 MG PO TBCR: EXTENDED_RELEASE_TABLET | ORAL | 5 refills | Status: DC

## 2018-10-01 NOTE — Telephone Encounter (Signed)
Pt states she needs these refills  ALPRAZolam (XANAX) 1 MG tablet  zolpidem (AMBIEN CR) 6.25 MG CR tablet  [id number] [place], [place] - [address] [phone] (Fax)   Pt is very upset that she has no xanax .  Pt started crying stating she does not know what is going on, and why she has not received Rx yet. advised we are waiting on the dr to approve. Pt has upcoming appt 11/11/18. Pt verbalized understanding.

## 2018-10-01 NOTE — Addendum Note (Signed)
Addended by: Karren Cobble on: 10/01/2018 03:52 PM   Modules accepted: Orders

## 2018-10-26 ENCOUNTER — Other Ambulatory Visit: Payer: Self-pay | Admitting: Internal Medicine

## 2018-11-11 ENCOUNTER — Ambulatory Visit: Payer: Medicare Other | Admitting: Internal Medicine

## 2018-11-12 ENCOUNTER — Telehealth: Payer: Self-pay | Admitting: Neurology

## 2018-11-12 NOTE — Telephone Encounter (Signed)
Please advise 

## 2018-11-12 NOTE — Telephone Encounter (Signed)
Called and advised Pt 

## 2018-11-12 NOTE — Telephone Encounter (Signed)
I haven't seen her in several years but I remember her case well.  Short of possible DBS (not even sure she would be a candidate), there is not much else.  We no longer evaluate DBS for dystonia but [facility] does and that is her best bet.  Sorry its not better news!

## 2018-11-12 NOTE — Telephone Encounter (Signed)
Patient states that the BOTOX injections are not working any longer and she is getting worse. She wants to know if there is something else we can do and would like to see Dr Tat please call

## 2018-11-13 ENCOUNTER — Encounter: Payer: Self-pay | Admitting: Internal Medicine

## 2018-11-13 ENCOUNTER — Ambulatory Visit (INDEPENDENT_AMBULATORY_CARE_PROVIDER_SITE_OTHER): Payer: Medicare Other | Admitting: Internal Medicine

## 2018-11-13 DIAGNOSIS — F4323 Adjustment disorder with mixed anxiety and depressed mood: Secondary | ICD-10-CM | POA: Diagnosis not present

## 2018-11-13 DIAGNOSIS — F411 Generalized anxiety disorder: Secondary | ICD-10-CM

## 2018-11-13 DIAGNOSIS — G4700 Insomnia, unspecified: Secondary | ICD-10-CM | POA: Diagnosis not present

## 2018-11-13 DIAGNOSIS — G245 Blepharospasm: Secondary | ICD-10-CM | POA: Diagnosis not present

## 2018-11-13 MED ORDER — ALPRAZOLAM 2 MG PO TABS: 2.0000 mg | ORAL_TABLET | Freq: Three times a day (TID) | ORAL | 5 refills | Status: DC | PRN

## 2018-11-13 MED ORDER — SERTRALINE HCL 100 MG PO TABS: 200.0000 mg | ORAL_TABLET | Freq: Every day | ORAL | 11 refills | Status: DC

## 2018-11-13 NOTE — Telephone Encounter (Signed)
Virtual visit made

## 2018-11-13 NOTE — Assessment & Plan Note (Signed)
Worse.  There is no psychotic features present at the moment.  I think Cheron has developed tolerance to her alprazolam.  We will increase Xanax to 2 mg 3 times daily.  Increase Zoloft to 200 mg daily.  Stop Benadryl

## 2018-11-13 NOTE — Progress Notes (Signed)
Virtual Visit via Telephone Note  I connected with Linda Hayes on 11/13/18 at  2:40 PM EDT by telephone and verified that I am speaking with the correct person using two identifiers.   I discussed the limitations, risks, security and privacy concerns of performing an evaluation and management service by telephone and the availability of in person appointments. I also discussed with the patient that there may be a patient responsible charge related to this service. The patient expressed understanding and agreed to proceed.   History of Present Illness:   Linda Hayes is complaining of severe anxiety, panic attacks, depression, sensation of crawling on her skin and itching at night.  The problems have been getting worse over past several months, especially, over the past several weeks.  She had thoughts of ending her life out of severe frustration with her tremors, anxiety and jerking movements.  She has been taking her medicines as prescribed.  She started to take Benadryl at night to help her itching. Observations/Objective: Linda Hayes is in mild to moderate emotional distress.  She is tearful.  There is a lot of tremor and jerking motions of her head.  She denies having any suicidal plans.  Specifically she does not have any plans to overdose on her medicines.  She denies having hallucinations, hearing voices etc.  Assessment and Plan: See plan  Follow Up Instructions:    I discussed the assessment and treatment plan with the patient. The patient was provided an opportunity to ask questions and all were answered. The patient agreed with the plan and demonstrated an understanding of the instructions.   The patient was advised to call back or seek an in-person evaluation if the symptoms worsen or if the condition fails to improve as anticipated.  I provided 20 minutes of non-face-to-face time during this encounter.   Walker Kehr, MD

## 2018-11-13 NOTE — Assessment & Plan Note (Addendum)
Worse.  Continue zolpidem at at bedtime. Consider Vistaril or trazodone if needed

## 2018-11-13 NOTE — Assessment & Plan Note (Signed)
F/u w/Dr Tat

## 2018-11-13 NOTE — Assessment & Plan Note (Signed)
Worse.  There is no psychotic features present at the moment.  I think Linda Hayes has developed tolerance to her alprazolam.  We will increase Xanax to 2 mg 3 times daily.  Increase Zoloft to 200 mg daily.  Stop Benadryl

## 2018-11-25 ENCOUNTER — Ambulatory Visit: Payer: Medicare Other | Admitting: Internal Medicine

## 2018-11-27 ENCOUNTER — Ambulatory Visit (INDEPENDENT_AMBULATORY_CARE_PROVIDER_SITE_OTHER): Payer: Medicare Other | Admitting: Internal Medicine

## 2018-11-27 ENCOUNTER — Encounter: Payer: Self-pay | Admitting: Internal Medicine

## 2018-11-27 DIAGNOSIS — F4323 Adjustment disorder with mixed anxiety and depressed mood: Secondary | ICD-10-CM | POA: Diagnosis not present

## 2018-11-27 DIAGNOSIS — G245 Blepharospasm: Secondary | ICD-10-CM | POA: Diagnosis not present

## 2018-11-27 DIAGNOSIS — F419 Anxiety disorder, unspecified: Secondary | ICD-10-CM | POA: Diagnosis not present

## 2018-11-27 NOTE — Assessment & Plan Note (Signed)
Better with Zoloft 200 mg daily

## 2018-11-27 NOTE — Assessment & Plan Note (Signed)
Follow-up with neurology 

## 2018-11-27 NOTE — Progress Notes (Signed)
Virtual Visit via Telephone Note  I connected with Linda Hayes on 11/27/18 at  2:40 PM EDT by telephone and verified that I am speaking with the correct person using two identifiers.   I discussed the limitations, risks, security and privacy concerns of performing an evaluation and management service by telephone and the availability of in person appointments. I also discussed with the patient that there may be a patient responsible charge related to this service. The patient expressed understanding and agreed to proceed.   History of Present Illness:   Follow-up on the attacks, anxiety, insomnia.  Feeling much better on a higher dose of alprazolam.  Sleeping better.  Stopped taking Benadryl.  No dark thoughts. Observations/Objective: Linda Hayes looks much better.  She is no acute distress.  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 The patient was provided an opportunity to ask questions and all were answered. The patient agreed with the plan and demonstrated an understanding of the instructions.   The patient was advised to call back or seek an in-person evaluation if the symptoms worsen or if the condition fails to improve as anticipated.  I provided 20  minutes of non-face-to-face time during this encounter.   Walker Kehr, MD

## 2018-11-27 NOTE — Assessment & Plan Note (Signed)
Doing much better on Xanax 2 mg 3 times a day and Zoloft 200 mg daily.  Follow-up with me in June

## 2019-01-28 ENCOUNTER — Ambulatory Visit (INDEPENDENT_AMBULATORY_CARE_PROVIDER_SITE_OTHER): Payer: Medicare Other | Admitting: Internal Medicine

## 2019-01-28 ENCOUNTER — Other Ambulatory Visit: Payer: Self-pay

## 2019-01-28 ENCOUNTER — Encounter: Payer: Self-pay | Admitting: Internal Medicine

## 2019-01-28 ENCOUNTER — Other Ambulatory Visit (INDEPENDENT_AMBULATORY_CARE_PROVIDER_SITE_OTHER): Payer: Medicare Other

## 2019-01-28 DIAGNOSIS — G4700 Insomnia, unspecified: Secondary | ICD-10-CM | POA: Diagnosis not present

## 2019-01-28 DIAGNOSIS — E785 Hyperlipidemia, unspecified: Secondary | ICD-10-CM

## 2019-01-28 DIAGNOSIS — E890 Postprocedural hypothyroidism: Secondary | ICD-10-CM | POA: Diagnosis not present

## 2019-01-28 DIAGNOSIS — F419 Anxiety disorder, unspecified: Secondary | ICD-10-CM

## 2019-01-28 DIAGNOSIS — R269 Unspecified abnormalities of gait and mobility: Secondary | ICD-10-CM

## 2019-01-28 DIAGNOSIS — G245 Blepharospasm: Secondary | ICD-10-CM

## 2019-01-28 LAB — CBC WITH DIFFERENTIAL/PLATELET
Basophils Absolute: 0.1 10*3/uL (ref 0.0–0.1)
Basophils Relative: 1.4 % (ref 0.0–3.0)
Eosinophils Absolute: 0.2 10*3/uL (ref 0.0–0.7)
Eosinophils Relative: 2.9 % (ref 0.0–5.0)
HCT: 43 % (ref 36.0–46.0)
Hemoglobin: 14.8 g/dL (ref 12.0–15.0)
Lymphocytes Relative: 36.9 % (ref 12.0–46.0)
Lymphs Abs: 3.1 10*3/uL (ref 0.7–4.0)
MCHC: 34.4 g/dL (ref 30.0–36.0)
MCV: 93.8 fl (ref 78.0–100.0)
Monocytes Absolute: 0.6 10*3/uL (ref 0.1–1.0)
Monocytes Relative: 6.6 % (ref 3.0–12.0)
Neutro Abs: 4.4 10*3/uL (ref 1.4–7.7)
Neutrophils Relative %: 52.2 % (ref 43.0–77.0)
Platelets: 289 10*3/uL (ref 150.0–400.0)
RBC: 4.58 Mil/uL (ref 3.87–5.11)
RDW: 13.3 % (ref 11.5–15.5)
WBC: 8.5 10*3/uL (ref 4.0–10.5)

## 2019-01-28 LAB — URINALYSIS, ROUTINE W REFLEX MICROSCOPIC
Bilirubin Urine: NEGATIVE
Hgb urine dipstick: NEGATIVE
Ketones, ur: NEGATIVE
Nitrite: POSITIVE — AB
RBC / HPF: NONE SEEN (ref 0–?)
Specific Gravity, Urine: 1.02 (ref 1.000–1.030)
Total Protein, Urine: NEGATIVE
Urine Glucose: NEGATIVE
Urobilinogen, UA: 0.2 (ref 0.0–1.0)
pH: 6 (ref 5.0–8.0)

## 2019-01-28 LAB — LIPID PANEL
Cholesterol: 217 mg/dL — ABNORMAL HIGH (ref 0–200)
HDL: 33.2 mg/dL — ABNORMAL LOW (ref >39.00)
NonHDL: 183.85
Total CHOL/HDL Ratio: 7
Triglycerides: 353 mg/dL — ABNORMAL HIGH (ref 0.0–149.0)
VLDL: 70.6 mg/dL — ABNORMAL HIGH (ref 0.0–40.0)

## 2019-01-28 LAB — HEPATIC FUNCTION PANEL
ALT: 15 U/L (ref 0–35)
AST: 19 U/L (ref 0–37)
Albumin: 4.2 g/dL (ref 3.5–5.2)
Alkaline Phosphatase: 71 U/L (ref 39–117)
Bilirubin, Direct: 0.1 mg/dL (ref 0.0–0.3)
Total Bilirubin: 0.5 mg/dL (ref 0.2–1.2)
Total Protein: 7.3 g/dL (ref 6.0–8.3)

## 2019-01-28 LAB — BASIC METABOLIC PANEL
BUN: 16 mg/dL (ref 6–23)
CO2: 26 mEq/L (ref 19–32)
Calcium: 9.1 mg/dL (ref 8.4–10.5)
Chloride: 105 mEq/L (ref 96–112)
Creatinine, Ser: 0.92 mg/dL (ref 0.40–1.20)
GFR: 60.9 mL/min (ref >60.00)
Glucose, Bld: 92 mg/dL (ref 70–99)
Potassium: 4 mEq/L (ref 3.5–5.1)
Sodium: 140 mEq/L (ref 135–145)

## 2019-01-28 LAB — TSH: TSH: 2.08 u[IU]/mL (ref 0.35–4.50)

## 2019-01-28 LAB — LDL CHOLESTEROL, DIRECT: Direct LDL: 130 mg/dL

## 2019-01-28 NOTE — Assessment & Plan Note (Signed)
No change 

## 2019-01-28 NOTE — Assessment & Plan Note (Signed)
Krill oil, diet, wt loss

## 2019-01-28 NOTE — Assessment & Plan Note (Signed)
Zolpidem at hs prn

## 2019-01-28 NOTE — Progress Notes (Signed)
Subjective:  Patient ID: Linda Hayes, female    DOB: 02-05-1951  Age: 68 y.o. MRN: [id number]  CC: No chief complaint on file.   HPI Linda Hayes presents for tremor, anxiety, depression  Outpatient Medications Prior to Visit  Medication Sig Dispense Refill  . alprazolam (XANAX) 2 MG tablet Take 1 tablet (2 mg total) by mouth 3 (three) times daily as needed for anxiety. 90 tablet 5  . cholecalciferol (VITAMIN D) 1000 UNITS tablet Take 1,000 Units by mouth daily.      [name] ibuprofen (ADVIL,MOTRIN) 800 MG tablet Take 1 tablet (800 mg total) by mouth every 8 (eight) hours as needed. 60 tablet 0  . sertraline (ZOLOFT) 100 MG tablet Take 2 tablets (200 mg total) by mouth daily. 60 tablet 11  . sulfamethoxazole-trimethoprim (BACTRIM DS,SEPTRA DS) 800-160 MG tablet Take 1 tablet by mouth 2 (two) times daily. 14 tablet 0  . SYNTHROID 125 MCG tablet TAKE 1 TABLET BY MOUTH EVERY DAY 30 tablet 11  . zolpidem (AMBIEN CR) 6.25 MG CR tablet TAKE 1 TABLET BY MOUTH EVERYDAY AT BEDTIME 30 tablet 5   No facility-administered medications prior to visit.     ROS: Review of Systems  Constitutional: Negative for activity change, appetite change, chills, fatigue and unexpected weight change.  HENT: Negative for congestion, mouth sores and sinus pressure.   Eyes: Negative for visual disturbance.  Respiratory: Negative for cough and chest tightness.   Gastrointestinal: Negative for abdominal pain and nausea.  Genitourinary: Negative for difficulty urinating, frequency and vaginal pain.  Musculoskeletal: Positive for gait problem. Negative for back pain.  Skin: Negative for pallor and rash.  Neurological: Positive for tremors. Negative for dizziness, weakness, numbness and headaches.  Psychiatric/Behavioral: Positive for decreased concentration, dysphoric mood and sleep disturbance. Negative for confusion. The patient is nervous/anxious.     Objective:  BP 112/74 (BP Location: Left Arm, Patient  Position: Sitting, Cuff Size: Large)   Pulse 88   Temp 98 F (36.7 C) (Oral)   Ht 5\' 4"  (1.626 m)   Wt 199 lb (90.3 kg)   SpO2 97%   BMI 34.16 kg/m   BP Readings from Last 3 Encounters:  01/28/19 112/74  01/22/18 106/68  10/01/17 106/72    Wt Readings from Last 3 Encounters:  01/28/19 199 lb (90.3 kg)  01/22/18 211 lb (95.7 kg)  10/01/17 211 lb (95.7 kg)    Physical Exam Constitutional:      General: She is not in acute distress.    Appearance: She is well-developed.  HENT:     Head: Normocephalic.     Right Ear: External ear normal.     Left Ear: External ear normal.     Nose: Nose normal.  Eyes:     General:        Right eye: No discharge.        Left eye: No discharge.     Conjunctiva/sclera: Conjunctivae normal.     Pupils: Pupils are equal, round, and reactive to light.  Neck:     Musculoskeletal: Normal range of motion and neck supple.     Thyroid: No thyromegaly.     Vascular: No JVD.     Trachea: No tracheal deviation.  Cardiovascular:     Rate and Rhythm: Normal rate and regular rhythm.     Heart sounds: Normal heart sounds.  Pulmonary:     Effort: No respiratory distress.     Breath sounds: No stridor. No wheezing.  Abdominal:  General: Bowel sounds are normal. There is no distension.     Palpations: Abdomen is soft. There is no mass.     Tenderness: There is no abdominal tenderness. There is no guarding or rebound.  Musculoskeletal:        General: No tenderness.  Lymphadenopathy:     Cervical: No cervical adenopathy.  Skin:    Findings: No erythema or rash.  Neurological:     Mental Status: She is oriented to person, place, and time.     Cranial Nerves: No cranial nerve deficit.     Motor: No abnormal muscle tone.     Coordination: Coordination normal.     Deep Tendon Reflexes: Reflexes normal.  Psychiatric:        Behavior: Behavior normal.        Thought Content: Thought content normal.        Judgment: Judgment normal.   head  tremor  Lab Results  Component Value Date   WBC 8.0 01/22/2018   HGB 14.9 01/22/2018   HCT 43.1 01/22/2018   PLT 340.0 01/22/2018   GLUCOSE 96 01/22/2018   CHOL 215 (H) 01/22/2018   TRIG 309.0 (H) 01/22/2018   HDL 29.40 (L) 01/22/2018   LDLDIRECT 138.0 01/22/2018   ALT 15 01/22/2018   AST 18 01/22/2018   NA 140 01/22/2018   K 3.9 01/22/2018   CL 105 01/22/2018   CREATININE 0.90 01/22/2018   BUN 14 01/22/2018   CO2 27 01/22/2018   TSH 0.22 (L) 01/22/2018    US Renal  Result Date: 02/22/2018 CLINICAL DATA:  Urinary tract infection last month. EXAM: RENAL / URINARY TRACT ULTRASOUND COMPLETE COMPARISON:  CT abdomen pelvis dated July 13, 2005. FINDINGS: Right Kidney: Length: 11.0 cm. Echogenicity within normal limits. No mass or hydronephrosis visualized. Mild interval enlargement in the simple cyst arising from the lower pole, now measuring up to 6.0 cm. Left Kidney: Length: 11.3 cm. Echogenicity within normal limits. No mass or hydronephrosis visualized. Bladder: Appears normal for degree of bladder distention. IMPRESSION: 1. No acute abnormality. 2. Large right renal simple cyst. Electronically Signed   By: Titus Dubin M.D.   On: 02/22/2018 15:12    Assessment & Plan:   There are no diagnoses linked to this encounter.   No orders of the defined types were placed in this encounter.    Follow-up: No follow-ups on file.  Walker Kehr, MD

## 2019-01-28 NOTE — Assessment & Plan Note (Signed)
Potential benefits of a long term benzodiazepines  use as well as potential risks  and complications were explained to the patient and were aknowledged. Better w/ Xanax increase to 2 mg 3 times daily.  Zoloft to 200 mg daily.

## 2019-01-28 NOTE — Assessment & Plan Note (Signed)
[facility] - Dr Theodoro Doing

## 2019-01-28 NOTE — Assessment & Plan Note (Signed)
On Levothroid 

## 2019-02-04 ENCOUNTER — Other Ambulatory Visit: Payer: Self-pay | Admitting: Internal Medicine

## 2019-02-04 DIAGNOSIS — N390 Urinary tract infection, site not specified: Secondary | ICD-10-CM

## 2019-02-04 DIAGNOSIS — G245 Blepharospasm: Secondary | ICD-10-CM

## 2019-02-04 NOTE — Progress Notes (Signed)
urol

## 2019-03-25 ENCOUNTER — Ambulatory Visit: Payer: Self-pay | Admitting: Diagnostic Neuroimaging

## 2019-03-28 ENCOUNTER — Other Ambulatory Visit: Payer: Self-pay | Admitting: Internal Medicine

## 2019-05-08 ENCOUNTER — Other Ambulatory Visit: Payer: Self-pay | Admitting: Internal Medicine

## 2019-05-12 ENCOUNTER — Ambulatory Visit: Payer: Medicare Other | Admitting: Diagnostic Neuroimaging

## 2019-05-18 DIAGNOSIS — R350 Frequency of micturition: Secondary | ICD-10-CM | POA: Diagnosis not present

## 2019-05-18 DIAGNOSIS — N3001 Acute cystitis with hematuria: Secondary | ICD-10-CM | POA: Diagnosis not present

## 2019-05-18 DIAGNOSIS — Z23 Encounter for immunization: Secondary | ICD-10-CM | POA: Diagnosis not present

## 2019-05-18 DIAGNOSIS — R300 Dysuria: Secondary | ICD-10-CM | POA: Diagnosis not present

## 2019-05-19 ENCOUNTER — Ambulatory Visit: Payer: Medicare Other | Admitting: Diagnostic Neuroimaging

## 2019-07-09 ENCOUNTER — Other Ambulatory Visit: Payer: Self-pay

## 2019-07-16 ENCOUNTER — Ambulatory Visit (INDEPENDENT_AMBULATORY_CARE_PROVIDER_SITE_OTHER): Payer: Medicare Other | Admitting: Internal Medicine

## 2019-07-16 ENCOUNTER — Encounter: Payer: Self-pay | Admitting: Internal Medicine

## 2019-07-16 DIAGNOSIS — F419 Anxiety disorder, unspecified: Secondary | ICD-10-CM | POA: Diagnosis not present

## 2019-07-16 DIAGNOSIS — G245 Blepharospasm: Secondary | ICD-10-CM

## 2019-07-16 DIAGNOSIS — N390 Urinary tract infection, site not specified: Secondary | ICD-10-CM | POA: Insufficient documentation

## 2019-07-16 MED ORDER — ALPRAZOLAM 2 MG PO TABS: 2.0000 mg | ORAL_TABLET | Freq: Three times a day (TID) | ORAL | 5 refills | Status: DC | PRN

## 2019-07-16 NOTE — Progress Notes (Signed)
Virtual Visit via Telephone Note  I connected with Linda Hayes on 07/16/19 at  8:10 AM EST by telephone and verified that I am speaking with the correct person using two identifiers.   I discussed the limitations, risks, security and privacy concerns of performing an evaluation and management service by telephone and the availability of in person appointments. I also discussed with the patient that there may be a patient responsible charge related to this service. The patient expressed understanding and agreed to proceed.   History of Present Illness: F/u anxiety, tremors. Doing well on current Rx.   Observations/Objective:  Sounds in NAD Assessment and Plan:  See plan Follow Up Instructions:    I discussed the assessment and treatment plan with the patient. The patient was provided an opportunity to ask questions and all were answered. The patient agreed with the plan and demonstrated an understanding of the instructions.   The patient was advised to call back or seek an in-person evaluation if the symptoms worsen or if the condition fails to improve as anticipated.  I provided 11 minutes of non-face-to-face time during this encounter.   Walker Kehr, MD

## 2019-07-16 NOTE — Assessment & Plan Note (Signed)
F/u w/Neurology 

## 2019-07-16 NOTE — Assessment & Plan Note (Signed)
Xanax prn  Potential benefits of a long term benzodiazepines  use as well as potential risks  and complications were explained to the patient and were aknowledged. Cont Zoloft

## 2019-07-31 ENCOUNTER — Other Ambulatory Visit: Payer: Self-pay | Admitting: Internal Medicine

## 2019-08-04 ENCOUNTER — Other Ambulatory Visit: Payer: Self-pay | Admitting: Internal Medicine

## 2019-08-04 NOTE — Telephone Encounter (Signed)
zolpidem (AMBIEN CR) 6.25 MG CR tablet  [id number] - [place], [place] [address] Phone:  [phone]  Fax:  [phone]     refill

## 2019-08-06 MED ORDER — ZOLPIDEM TARTRATE ER 6.25 MG PO TBCR: EXTENDED_RELEASE_TABLET | ORAL | 3 refills | Status: DC

## 2019-09-06 ENCOUNTER — Other Ambulatory Visit: Payer: Self-pay | Admitting: Internal Medicine

## 2019-09-27 ENCOUNTER — Ambulatory Visit: Payer: Medicare Other

## 2019-09-29 IMAGING — US US RENAL
1 series · 14 of 25 positions shown · non-contrast
Comparison: CT abdomen pelvis dated July 13, 2005.

CLINICAL DATA: Urinary tract infection last month.

EXAM:
RENAL / URINARY TRACT ULTRASOUND COMPLETE

[Series 1: us renal · 0.25mm/px · 14 of 34 slices shown]
[im 1/34]
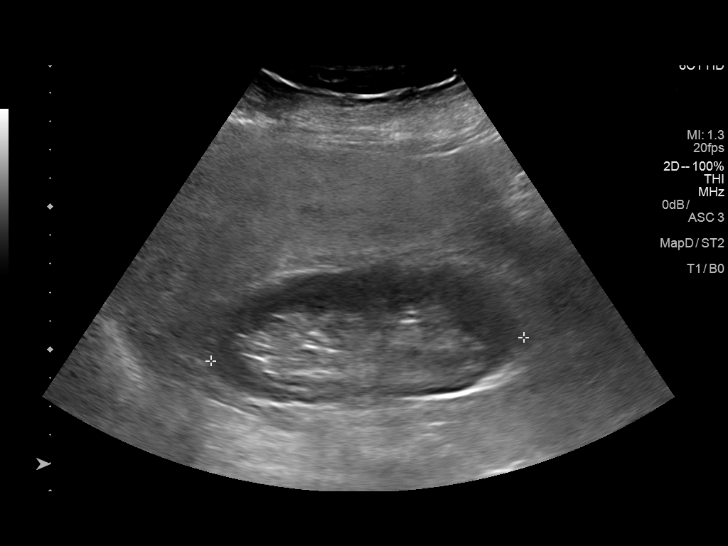
[im 3/34]
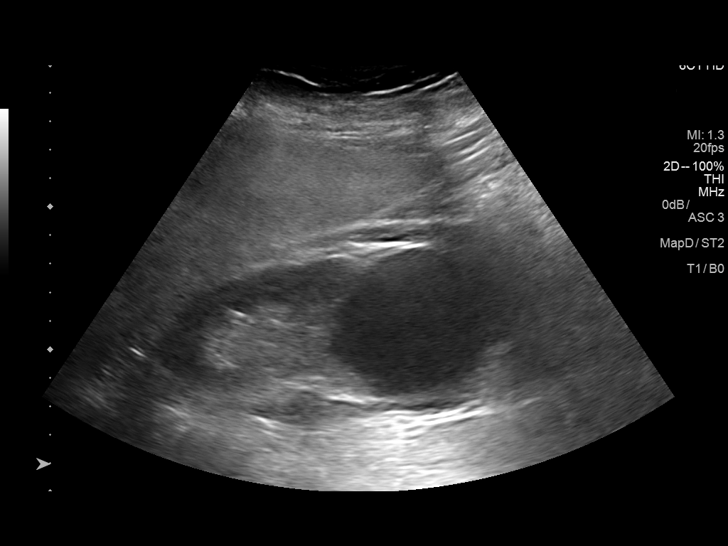
[im 6/34]
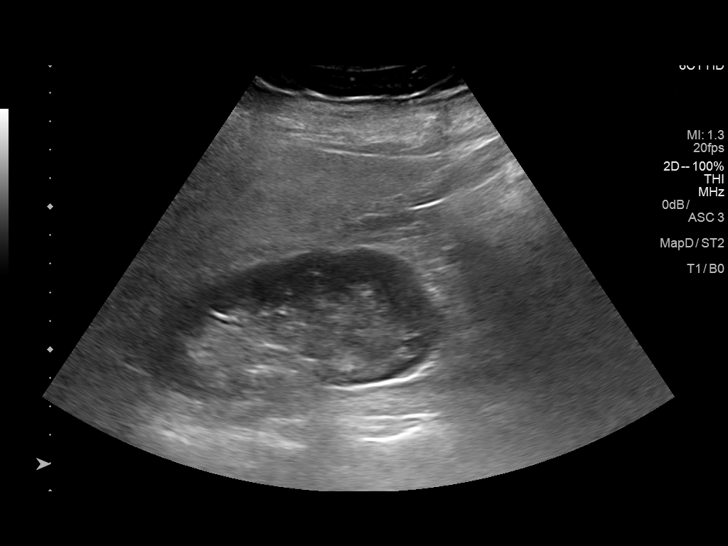
[im 9/34]
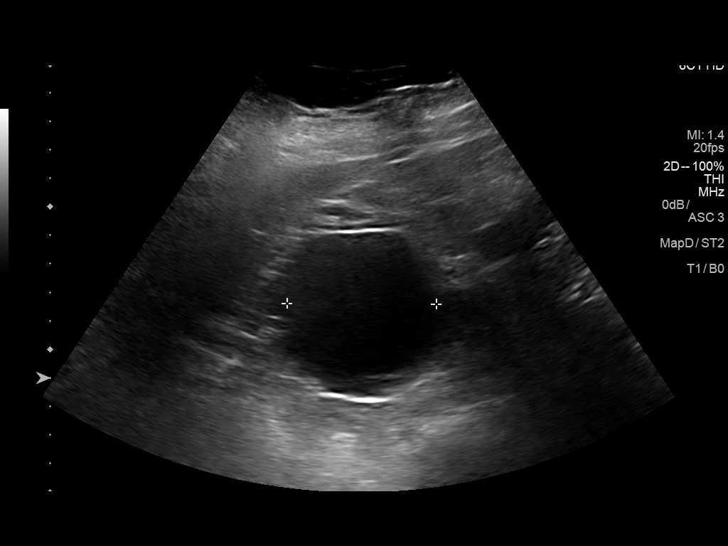
[im 12/34]
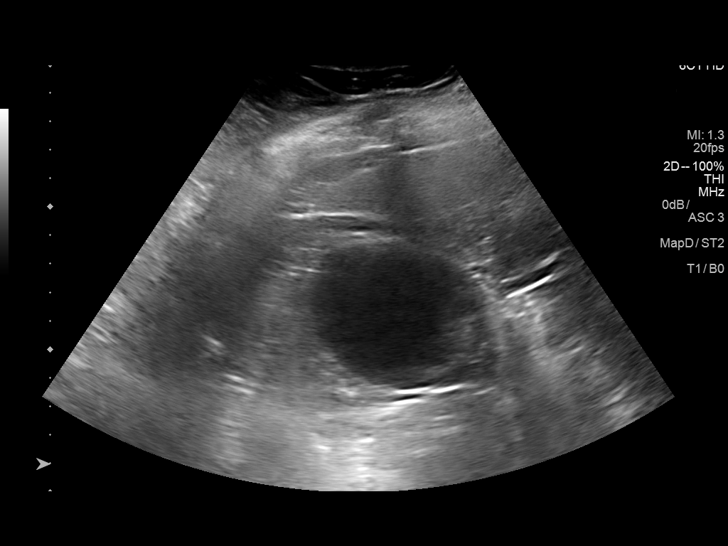
[im 13/34]
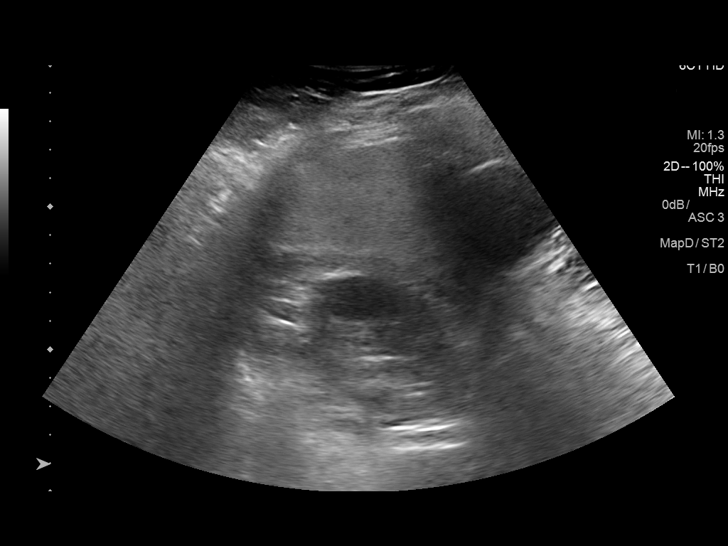
[im 16/34]
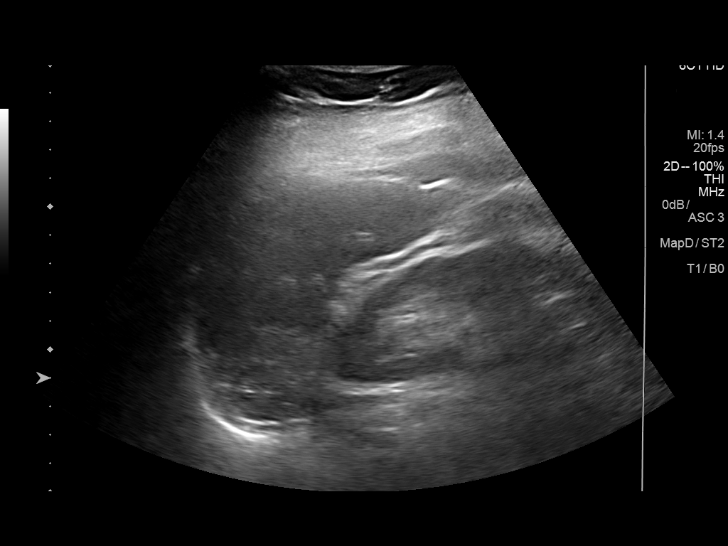
[im 18/34]
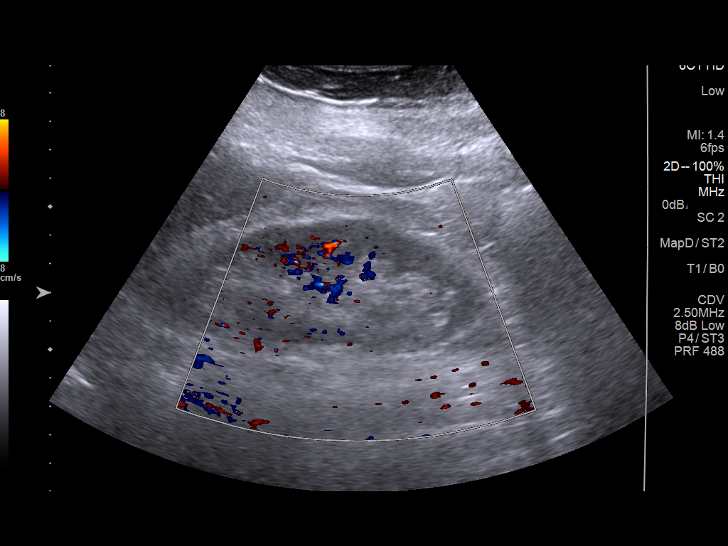
[im 21/34]
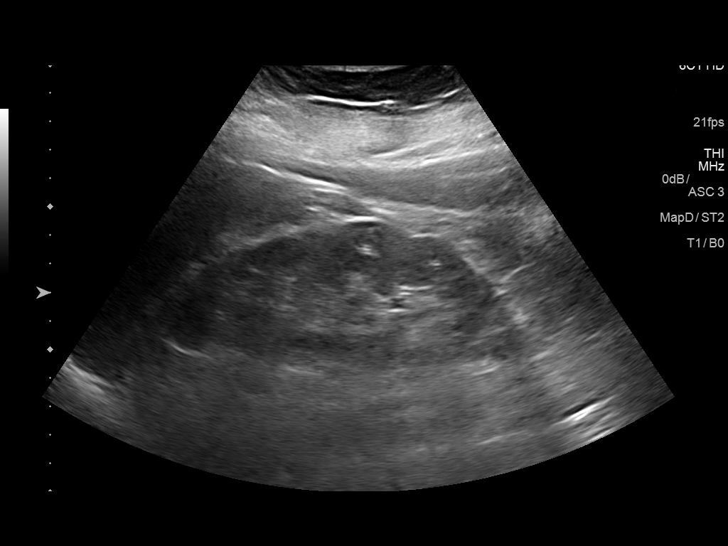
[im 23/34]
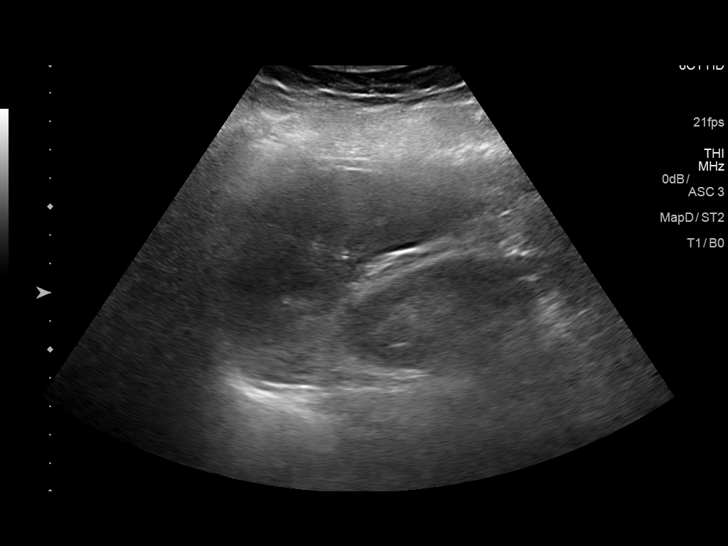
[im 25/34]
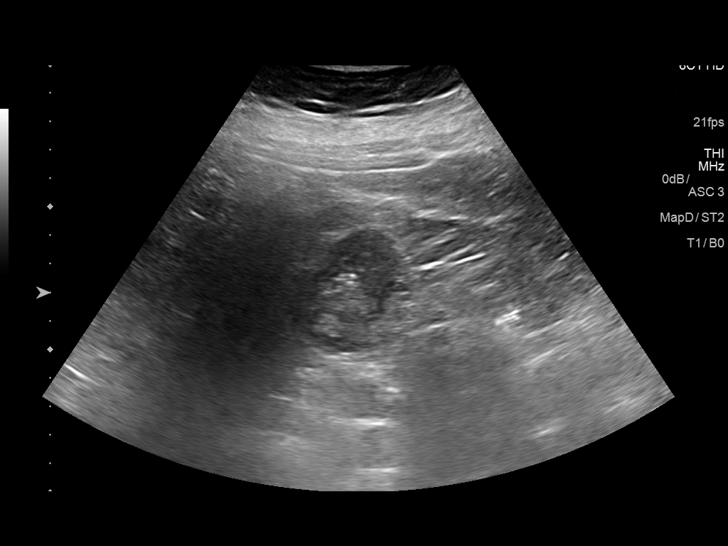
[im 28/34]
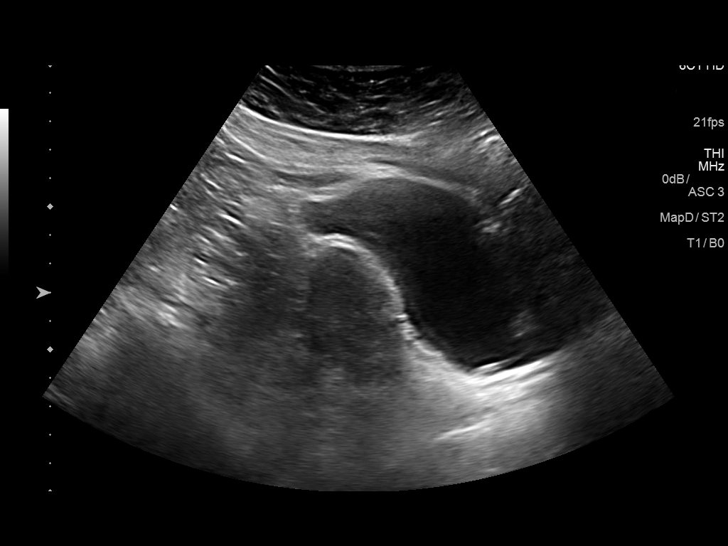
[im 31/34]
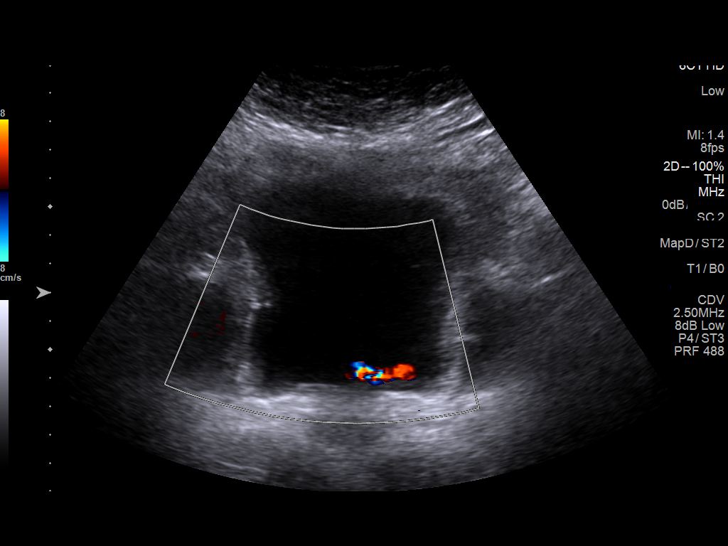
[im 34/34]
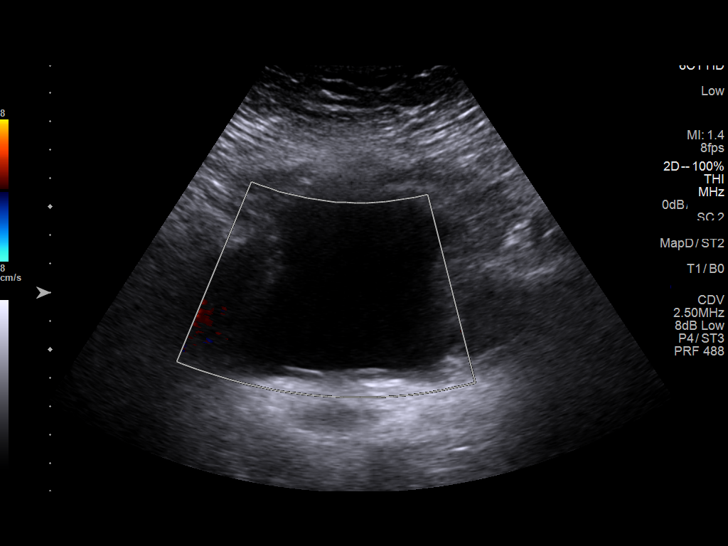

[14 of 25 positions shown; findings below may reference images not displayed]

FINDINGS: Right Kidney:

Length: 11.0 cm. Echogenicity within normal limits. No mass or
hydronephrosis visualized. Mild interval enlargement in the simple
cyst arising from the lower pole, now measuring up to 6.0 cm.

Left Kidney:

Length: 11.3 cm. Echogenicity within normal limits. No mass or
hydronephrosis visualized.

Bladder:

Appears normal for degree of bladder distention.
IMPRESSION: 1. No acute abnormality.
2. Large right renal simple cyst.

## 2019-10-08 ENCOUNTER — Telehealth: Payer: Self-pay | Admitting: *Deleted

## 2019-10-08 NOTE — Telephone Encounter (Signed)
Scheduled 1st Covid19 vaccine for tomorrow at [facility].

## 2019-10-09 ENCOUNTER — Ambulatory Visit: Payer: Medicare Other | Attending: Internal Medicine

## 2019-10-09 DIAGNOSIS — Z23 Encounter for immunization: Secondary | ICD-10-CM | POA: Insufficient documentation

## 2019-10-09 NOTE — Progress Notes (Signed)
   Covid-19 Vaccination Clinic  Name:  Linda Hayes    MRN: [id number] DOB: April 21, 1951  10/09/2019  [name] was observed post Covid-19 immunization for 15 minutes without incidence. She was provided with Vaccine Information Sheet and instruction to access the V-Safe system.   [name] was instructed to call 911 with any severe reactions post vaccine: [name] Difficulty breathing  . Swelling of your face and throat  . A fast heartbeat  . A bad rash all over your body  . Dizziness and weakness    Immunizations Administered    Name Date Dose VIS Date Route   Pfizer COVID-19 Vaccine 10/09/2019 10:11 AM 0.3 mL 07/25/2019 Intramuscular   Manufacturer: [place]   Lot: [id number]   [place]: [id number]

## 2019-10-31 ENCOUNTER — Other Ambulatory Visit: Payer: Self-pay | Admitting: Internal Medicine

## 2019-11-05 ENCOUNTER — Ambulatory Visit: Payer: Medicare Other

## 2019-11-10 ENCOUNTER — Ambulatory Visit: Payer: Medicare Other | Attending: Internal Medicine

## 2019-11-10 DIAGNOSIS — Z23 Encounter for immunization: Secondary | ICD-10-CM

## 2019-11-10 NOTE — Progress Notes (Signed)
   Covid-19 Vaccination Clinic  Name:  Linda Hayes    MRN: [id number] DOB: 09-Feb-1951  11/10/2019  Linda Hayes was observed post Covid-19 immunization for 15 minutes without incident. She was provided with Vaccine Information Sheet and instruction to access the V-Safe system.   Linda Hayes was instructed to call 911 with any severe reactions post vaccine: [name] Difficulty breathing  . Swelling of face and throat  . A fast heartbeat  . A bad rash all over body  . Dizziness and weakness   Immunizations Administered    Name Date Dose VIS Date Route   Pfizer COVID-19 Vaccine 11/10/2019  1:34 PM 0.3 mL 07/25/2019 Intramuscular   Manufacturer: [place]   Lot: [id number]   [place]: [id number]

## 2019-12-01 ENCOUNTER — Other Ambulatory Visit: Payer: Self-pay | Admitting: Internal Medicine

## 2019-12-01 MED ORDER — SULFAMETHOXAZOLE-TRIMETHOPRIM 800-160 MG PO TABS: 1.0000 | ORAL_TABLET | Freq: Two times a day (BID) | ORAL | 0 refills | Status: DC

## 2019-12-03 ENCOUNTER — Other Ambulatory Visit: Payer: Self-pay | Admitting: Internal Medicine

## 2019-12-08 ENCOUNTER — Telehealth: Payer: Self-pay | Admitting: Internal Medicine

## 2019-12-08 NOTE — Telephone Encounter (Signed)
New Message:    Pt is calling and would like to know if Dr. Camila Li can take over doing her pap smears. She states she normally has them done at her OB office by Dr. Stann Mainland. Please advise.

## 2020-01-15 ENCOUNTER — Encounter: Payer: Self-pay | Admitting: Internal Medicine

## 2020-01-15 ENCOUNTER — Ambulatory Visit (INDEPENDENT_AMBULATORY_CARE_PROVIDER_SITE_OTHER): Payer: Medicare Other | Admitting: Internal Medicine

## 2020-01-15 ENCOUNTER — Other Ambulatory Visit: Payer: Self-pay

## 2020-01-15 ENCOUNTER — Ambulatory Visit: Payer: Medicare Other

## 2020-01-15 VITALS — BP 116/68 | HR 86 | Temp 98.4°F | Ht 64.0 in | Wt 190.8 lb

## 2020-01-15 DIAGNOSIS — E785 Hyperlipidemia, unspecified: Secondary | ICD-10-CM | POA: Diagnosis not present

## 2020-01-15 DIAGNOSIS — F419 Anxiety disorder, unspecified: Secondary | ICD-10-CM

## 2020-01-15 DIAGNOSIS — R1032 Left lower quadrant pain: Secondary | ICD-10-CM | POA: Diagnosis not present

## 2020-01-15 LAB — LIPID PANEL
Cholesterol: 243 mg/dL — ABNORMAL HIGH (ref 0–200)
HDL: 36.1 mg/dL — ABNORMAL LOW (ref >39.00)
NonHDL: 206.85
Total CHOL/HDL Ratio: 7
Triglycerides: 222 mg/dL — ABNORMAL HIGH (ref 0.0–149.0)
VLDL: 44.4 mg/dL — ABNORMAL HIGH (ref 0.0–40.0)

## 2020-01-15 LAB — URINALYSIS
Bilirubin Urine: NEGATIVE
Ketones, ur: NEGATIVE
Leukocytes,Ua: NEGATIVE
Nitrite: NEGATIVE
Specific Gravity, Urine: 1.02 (ref 1.000–1.030)
Total Protein, Urine: NEGATIVE
Urine Glucose: NEGATIVE
Urobilinogen, UA: 0.2 (ref 0.0–1.0)
pH: 6 (ref 5.0–8.0)

## 2020-01-15 LAB — CBC WITH DIFFERENTIAL/PLATELET
Basophils Absolute: 0.1 10*3/uL (ref 0.0–0.1)
Basophils Relative: 0.7 % (ref 0.0–3.0)
Eosinophils Absolute: 0.2 10*3/uL (ref 0.0–0.7)
Eosinophils Relative: 2.2 % (ref 0.0–5.0)
HCT: 43.4 % (ref 36.0–46.0)
Hemoglobin: 14.9 g/dL (ref 12.0–15.0)
Lymphocytes Relative: 39 % (ref 12.0–46.0)
Lymphs Abs: 3.4 10*3/uL (ref 0.7–4.0)
MCHC: 34.3 g/dL (ref 30.0–36.0)
MCV: 94.9 fl (ref 78.0–100.0)
Monocytes Absolute: 0.6 10*3/uL (ref 0.1–1.0)
Monocytes Relative: 6.8 % (ref 3.0–12.0)
Neutro Abs: 4.5 10*3/uL (ref 1.4–7.7)
Neutrophils Relative %: 51.3 % (ref 43.0–77.0)
Platelets: 310 10*3/uL (ref 150.0–400.0)
RBC: 4.57 Mil/uL (ref 3.87–5.11)
RDW: 13.8 % (ref 11.5–15.5)
WBC: 8.7 10*3/uL (ref 4.0–10.5)

## 2020-01-15 LAB — HEPATIC FUNCTION PANEL
ALT: 14 U/L (ref 0–35)
AST: 21 U/L (ref 0–37)
Albumin: 4.6 g/dL (ref 3.5–5.2)
Alkaline Phosphatase: 59 U/L (ref 39–117)
Bilirubin, Direct: 0.1 mg/dL (ref 0.0–0.3)
Total Bilirubin: 0.7 mg/dL (ref 0.2–1.2)
Total Protein: 7.7 g/dL (ref 6.0–8.3)

## 2020-01-15 LAB — BASIC METABOLIC PANEL
BUN: 13 mg/dL (ref 6–23)
CO2: 27 mEq/L (ref 19–32)
Calcium: 9.3 mg/dL (ref 8.4–10.5)
Chloride: 105 mEq/L (ref 96–112)
Creatinine, Ser: 0.93 mg/dL (ref 0.40–1.20)
GFR: 59.97 mL/min — ABNORMAL LOW (ref >60.00)
Glucose, Bld: 99 mg/dL (ref 70–99)
Potassium: 3.9 mEq/L (ref 3.5–5.1)
Sodium: 141 mEq/L (ref 135–145)

## 2020-01-15 LAB — TSH: TSH: 0.39 u[IU]/mL (ref 0.35–4.50)

## 2020-01-15 LAB — LDL CHOLESTEROL, DIRECT: Direct LDL: 166 mg/dL

## 2020-01-15 MED ORDER — CEFUROXIME AXETIL 500 MG PO TABS
500.0000 mg | ORAL_TABLET | Freq: Two times a day (BID) | ORAL | 0 refills | Status: DC
Start: 2020-01-15 — End: 2020-02-09

## 2020-01-15 NOTE — Assessment & Plan Note (Signed)
GI ref Dr Carlean Purl H/o C diff colitis treated w/po Vanco in 2007 Ceftin if worse for ?diverticulitis w/a probiotic

## 2020-01-15 NOTE — Addendum Note (Signed)
Addended by: Trenda Moots on: [id number] 08:57 AM   Modules accepted: Orders

## 2020-01-15 NOTE — Patient Instructions (Signed)

## 2020-01-15 NOTE — Assessment & Plan Note (Signed)
Chronic Xanax prn, Zoloft  Potential benefits of a long term benzodiazepines  use as well as potential risks  and complications were explained to the patient and were aknowledged. 3/20 Worse.  There is no psychotic features present at the moment.  I think Linda Hayes has developed tolerance to her alprazolam. Xanax  2 mg 3 times daily. Zoloft  200 mg daily.

## 2020-01-15 NOTE — Progress Notes (Signed)
Subjective:  Patient ID: Linda Hayes, female    DOB: 09/17/1950  Age: 69 y.o. MRN: [id number]  CC: Follow-up (6 month follow up)   HPI Linda Hayes presents for tremors, anxiety, depression f/u F/u UTI C/o LLQ pain x 1 month  Outpatient Medications Prior to Visit  Medication Sig Dispense Refill  . alprazolam (XANAX) 2 MG tablet Take 1 tablet (2 mg total) by mouth 3 (three) times daily as needed for anxiety. 90 tablet 5  . cholecalciferol (VITAMIN D) 1000 UNITS tablet Take 1,000 Units by mouth daily.      [name] ibuprofen (ADVIL,MOTRIN) 800 MG tablet Take 1 tablet (800 mg total) by mouth every 8 (eight) hours as needed. 60 tablet 0  . sertraline (ZOLOFT) 100 MG tablet TAKE 2 TABLETS BY MOUTH EVERY DAY 60 tablet 11  . sulfamethoxazole-trimethoprim (BACTRIM DS) 800-160 MG tablet Take 1 tablet by mouth 2 (two) times daily. 14 tablet 0  . SYNTHROID 125 MCG tablet TAKE 1 TABLET BY MOUTH EVERY DAY 30 tablet 11  . zolpidem (AMBIEN CR) 6.25 MG CR tablet TAKE 1 TABLET BY MOUTH EVERYDAY AT BEDTIME 30 tablet 3  . zolpidem (AMBIEN CR) 6.25 MG CR tablet TAKE 1 TABLET BY MOUTH AT BEDTIME 30 tablet 3   No facility-administered medications prior to visit.    ROS: Review of Systems  Constitutional: Negative for activity change, appetite change, chills, fatigue and unexpected weight change.  HENT: Negative for congestion, mouth sores and sinus pressure.   Eyes: Negative for visual disturbance.  Respiratory: Negative for cough and chest tightness.   Gastrointestinal: Positive for abdominal pain. Negative for blood in stool, constipation, diarrhea and nausea.  Genitourinary: Negative for difficulty urinating, frequency and vaginal pain.  Musculoskeletal: Negative for back pain and gait problem.  Skin: Negative for pallor and rash.  Neurological: Positive for tremors. Negative for dizziness, weakness, numbness and headaches.  Psychiatric/Behavioral: Negative for confusion and sleep disturbance. The  patient is nervous/anxious.     Objective:  BP 116/68 (BP Location: Left Arm, Patient Position: Sitting, Cuff Size: Large)   Pulse 86   Temp 98.4 F (36.9 C) (Oral)   Ht 5\' 4"  (1.626 m)   Wt 190 lb 12.8 oz (86.5 kg)   SpO2 96%   BMI 32.75 kg/m   BP Readings from Last 3 Encounters:  01/15/20 116/68  01/28/19 112/74  01/22/18 106/68    Wt Readings from Last 3 Encounters:  01/15/20 190 lb 12.8 oz (86.5 kg)  01/28/19 199 lb (90.3 kg)  01/22/18 211 lb (95.7 kg)    Physical Exam Constitutional:      General: She is not in acute distress.    Appearance: She is well-developed.  HENT:     Head: Normocephalic.     Right Ear: External ear normal.     Left Ear: External ear normal.     Nose: Nose normal.  Eyes:     General:        Right eye: No discharge.        Left eye: No discharge.     Conjunctiva/sclera: Conjunctivae normal.     Pupils: Pupils are equal, round, and reactive to light.  Neck:     Thyroid: No thyromegaly.     Vascular: No JVD.     Trachea: No tracheal deviation.  Cardiovascular:     Rate and Rhythm: Normal rate and regular rhythm.     Heart sounds: Normal heart sounds.  Pulmonary:  Effort: No respiratory distress.     Breath sounds: No stridor. No wheezing.  Abdominal:     General: Bowel sounds are normal. There is no distension.     Palpations: Abdomen is soft. There is no mass.     Tenderness: There is no abdominal tenderness. There is no guarding or rebound.  Musculoskeletal:        General: No tenderness.     Cervical back: Normal range of motion and neck supple.  Lymphadenopathy:     Cervical: No cervical adenopathy.  Skin:    Findings: No erythema or rash.  Neurological:     Mental Status: She is oriented to person, place, and time.     Cranial Nerves: No cranial nerve deficit.     Motor: No abnormal muscle tone.     Coordination: Coordination abnormal.     Deep Tendon Reflexes: Reflexes normal.  Psychiatric:        Behavior:  Behavior normal.        Thought Content: Thought content normal.        Judgment: Judgment normal.   tics, tremor LLQ is sensitive, no mass  Lab Results  Component Value Date   WBC 8.5 01/28/2019   HGB 14.8 01/28/2019   HCT 43.0 01/28/2019   PLT 289.0 01/28/2019   GLUCOSE 92 01/28/2019   CHOL 217 (H) 01/28/2019   TRIG 353.0 (H) 01/28/2019   HDL 33.20 (L) 01/28/2019   LDLDIRECT 130.0 01/28/2019   ALT 15 01/28/2019   AST 19 01/28/2019   NA 140 01/28/2019   K 4.0 01/28/2019   CL 105 01/28/2019   CREATININE 0.92 01/28/2019   BUN 16 01/28/2019   CO2 26 01/28/2019   TSH 2.08 01/28/2019    US RENAL  Result Date: 02/22/2018 CLINICAL DATA:  Urinary tract infection last month. EXAM: RENAL / URINARY TRACT ULTRASOUND COMPLETE COMPARISON:  CT abdomen pelvis dated July 13, 2005. FINDINGS: Right Kidney: Length: 11.0 cm. Echogenicity within normal limits. No mass or hydronephrosis visualized. Mild interval enlargement in the simple cyst arising from the lower pole, now measuring up to 6.0 cm. Left Kidney: Length: 11.3 cm. Echogenicity within normal limits. No mass or hydronephrosis visualized. Bladder: Appears normal for degree of bladder distention. IMPRESSION: 1. No acute abnormality. 2. Large right renal simple cyst. Electronically Signed   By: Titus Dubin M.D.   On: 02/22/2018 15:12    Assessment & Plan:    Follow-up: No follow-ups on file.  Walker Kehr, MD

## 2020-01-15 NOTE — Assessment & Plan Note (Addendum)
Labs A cardiac CT scan for calcium scoring offered

## 2020-01-19 ENCOUNTER — Other Ambulatory Visit: Payer: Self-pay | Admitting: *Deleted

## 2020-01-19 DIAGNOSIS — Z1212 Encounter for screening for malignant neoplasm of rectum: Secondary | ICD-10-CM

## 2020-01-19 DIAGNOSIS — Z1211 Encounter for screening for malignant neoplasm of colon: Secondary | ICD-10-CM

## 2020-01-19 NOTE — Progress Notes (Signed)
colog

## 2020-01-20 ENCOUNTER — Telehealth: Payer: Self-pay | Admitting: Internal Medicine

## 2020-01-20 NOTE — Telephone Encounter (Signed)
See 01/14/20 patient message. Closing phone note.

## 2020-01-20 NOTE — Telephone Encounter (Signed)
New message:   Pt is calling and states she received a call on yesterday about receiving a cologuard. She states she is having a colonoscopy soon what does she need the cologuard for. Please advise.

## 2020-02-02 ENCOUNTER — Other Ambulatory Visit: Payer: Self-pay

## 2020-02-02 ENCOUNTER — Other Ambulatory Visit: Payer: Self-pay | Admitting: Internal Medicine

## 2020-02-02 NOTE — Telephone Encounter (Signed)
[place] Controlled Database Checked Last filled: alprazolam 07/16/2019 90 Zolpidem 08/06/2019 30 LOV w/you: 01/15/2020 Next appt w/you: none

## 2020-02-02 NOTE — Telephone Encounter (Deleted)
[place] Controlled Database Checked Last filled: LOV w/you:      Next appt w/you:

## 2020-02-09 ENCOUNTER — Other Ambulatory Visit (INDEPENDENT_AMBULATORY_CARE_PROVIDER_SITE_OTHER): Payer: Medicare Other

## 2020-02-09 ENCOUNTER — Encounter: Payer: Self-pay | Admitting: Internal Medicine

## 2020-02-09 ENCOUNTER — Ambulatory Visit (INDEPENDENT_AMBULATORY_CARE_PROVIDER_SITE_OTHER): Payer: Medicare Other | Admitting: Internal Medicine

## 2020-02-09 VITALS — BP 106/68 | HR 84 | Ht 64.0 in | Wt 191.0 lb

## 2020-02-09 DIAGNOSIS — R1032 Left lower quadrant pain: Secondary | ICD-10-CM

## 2020-02-09 DIAGNOSIS — R10814 Left lower quadrant abdominal tenderness: Secondary | ICD-10-CM | POA: Diagnosis not present

## 2020-02-09 DIAGNOSIS — R194 Change in bowel habit: Secondary | ICD-10-CM

## 2020-02-09 LAB — CBC WITH DIFFERENTIAL/PLATELET
Basophils Absolute: 0.1 10*3/uL (ref 0.0–0.1)
Basophils Relative: 0.8 % (ref 0.0–3.0)
Eosinophils Absolute: 0.2 10*3/uL (ref 0.0–0.7)
Eosinophils Relative: 2 % (ref 0.0–5.0)
HCT: 43.3 % (ref 36.0–46.0)
Hemoglobin: 15.1 g/dL — ABNORMAL HIGH (ref 12.0–15.0)
Lymphocytes Relative: 43.7 % (ref 12.0–46.0)
Lymphs Abs: 4.2 10*3/uL — ABNORMAL HIGH (ref 0.7–4.0)
MCHC: 34.8 g/dL (ref 30.0–36.0)
MCV: 93.7 fl (ref 78.0–100.0)
Monocytes Absolute: 0.7 10*3/uL (ref 0.1–1.0)
Monocytes Relative: 7.2 % (ref 3.0–12.0)
Neutro Abs: 4.4 10*3/uL (ref 1.4–7.7)
Neutrophils Relative %: 46.3 % (ref 43.0–77.0)
Platelets: 318 10*3/uL (ref 150.0–400.0)
RBC: 4.62 Mil/uL (ref 3.87–5.11)
RDW: 13.5 % (ref 11.5–15.5)
WBC: 9.6 10*3/uL (ref 4.0–10.5)

## 2020-02-09 LAB — COMPREHENSIVE METABOLIC PANEL
ALT: 14 U/L (ref 0–35)
AST: 17 U/L (ref 0–37)
Albumin: 4.5 g/dL (ref 3.5–5.2)
Alkaline Phosphatase: 72 U/L (ref 39–117)
BUN: 15 mg/dL (ref 6–23)
CO2: 27 mEq/L (ref 19–32)
Calcium: 9.5 mg/dL (ref 8.4–10.5)
Chloride: 105 mEq/L (ref 96–112)
Creatinine, Ser: 0.89 mg/dL (ref 0.40–1.20)
GFR: 63.08 mL/min (ref >60.00)
Glucose, Bld: 94 mg/dL (ref 70–99)
Potassium: 4.2 mEq/L (ref 3.5–5.1)
Sodium: 139 mEq/L (ref 135–145)
Total Bilirubin: 0.5 mg/dL (ref 0.2–1.2)
Total Protein: 7.7 g/dL (ref 6.0–8.3)

## 2020-02-09 NOTE — Progress Notes (Signed)
Linda Hayes 69 y.o. 1951/04/02 [id number]  Assessment & Plan:   Encounter Diagnoses  Name Primary?  . Left lower quadrant abdominal tenderness without rebound tenderness Yes  . LLQ pain   . Change in bowel habits    evaluate with CT abd/pelvis ? Diverticulitis, other colon lesion Check CBC and CMET as well  Has colonoscopy for screening set up in August and late July previsit May need to change   I appreciate the opportunity to care for this patient. CC: Linda Hayes, Linda Lacks, MD   Subjective:   Chief Complaint:LLQ pain  HPI Linda Hayes is a 69 year old married white woman here with her husband and daughterBecause of left lower quadrant pain that started after taking antibiotics once or twice for urinary tract infection.  She has a remote history of C. difficile colitis and diarrhea in 2007 and though she does not have diarrhea now, the pain reminds her of that.  Her stools are soft.  She is not awakened at night with symptoms.  I last saw her for a colonoscopy due to persistent diarrhea and pain after her C. difficile, colonoscopy on 12/01/2005 was negative.  She is not describing fever, rectal bleeding.  Stools are smaller and thinner.  Urinary symptoms are gone after treatment for UTI.  CBC was normal about 3 weeks ago.  The pain is intensified since then began somewhat before that. Allergies  Allergen Reactions  . Ciprofloxacin     Caused colitis  . Erythromycin     Intolerance - GI  . Gabapentin     cough  . Hydrocodone Nausea And Vomiting  . Penicillins     rash  . Tramadol     n/v  . Ritalin  [Methylphenidate] Palpitations   Current Meds  Medication Sig  . alprazolam (XANAX) 2 MG tablet TAKE 1 TABLET (2 MG TOTAL) BY MOUTH 3 (THREE) TIMES DAILY AS NEEDED FOR ANXIETY.  [name] ibuprofen (ADVIL,MOTRIN) 800 MG tablet Take 1 tablet (800 mg total) by mouth every 8 (eight) hours as needed.  . sertraline (ZOLOFT) 100 MG tablet TAKE 2 TABLETS BY MOUTH EVERY DAY  .  SYNTHROID 125 MCG tablet TAKE 1 TABLET BY MOUTH EVERY DAY  . zolpidem (AMBIEN CR) 6.25 MG CR tablet TAKE 1 TABLET BY MOUTH EVERYDAY AT BEDTIME   Past Medical History:  Diagnosis Date  . ANXIETY 03/17/2008  . ASTHMATIC BRONCHITIS, ACUTE 10/01/2008  . 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 Colitis due to Clostridioides difficile 2007  . DEPRESSION 03/17/2008  . GAIT ATAXIA 06/11/2009  . HYPOTHYROIDISM 03/17/2008  . INSOMNIA, PERSISTENT 03/17/2008  . Meige syndrome (blepharospasm with oromandibular dystonia)   . OBESITY 03/17/2008  . Palpitations 10/01/2008  . SWEATING 03/17/2008   Past Surgical History:  Procedure Laterality Date  . COLONOSCOPY    . 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   Review of Systems As per HPI Chronic gait disturbance, movement disorder  Objective:   Physical Exam BP 106/68   Pulse 84   Ht 5\' 4"  (1.626 m)   Wt 191 lb (86.6 kg)   BMI 32.79 kg/m  NAD Head tremor/abnl movement and blinking also Lungs are clear Cor NL S1S2 no murmur Abdomen is obese, soft  and mod tender with some guarding in LLQ w/ mass, BS + Alert and oriented x 3

## 2020-02-09 NOTE — Patient Instructions (Signed)
Your provider has requested that you go to the basement level for lab work before leaving today. Press "B" on the elevator. The lab is located at the first door on the left as you exit the elevator.  Normal BMI (Body Mass Index- based on height and weight) is between 23 and 30. Your BMI today is Body mass index is 32.79 kg/m. [name] Please consider follow up  regarding your BMI with your Primary Care Provider.   You have been scheduled for a CT scan of the abdomen and pelvis at [facility], 1st floor Radiology. You are scheduled on 02/19/2020  at 12:30pm. You should arrive 15 minutes prior to your appointment time for registration. Please follow the written instructions below on the day of your exam:    1) Do not eat anything after 8:30AM (4 hours prior to your test)  2) You have been given  (At least 3 days prior to your procedure you will need to pick up) 2 bottles of oral contrast to drink.  The solution may taste better if refrigerated, but do NOT add ice or any other liquid to this solution. Shake well before drinking.   Drink 1 bottle of contrast @ 10:30AM (2 hours prior to your exam)  Drink 1 bottle of contrast @ 11:30AM (1 hour prior to your exam)   You may take any medications as prescribed with a small amount of water, if necessary. If you take any of the following medications: METFORMIN, GLUCOPHAGE, GLUCOVANCE, AVANDAMET, RIOMET, FORTAMET, [place] MET, JANUMET, GLUMETZA or METAGLIP, you MAY be asked to HOLD this medication 48 hours AFTER the exam.   The purpose of you drinking the oral contrast is to aid in the visualization of your intestinal tract. The contrast solution may cause some diarrhea. Depending on your individual set of symptoms, you may also receive an intravenous injection of x-ray contrast/dye. Plan on being at [facility] for 45 minutes or longer, depending on the type of exam you are having performed.   If you have any questions regarding your exam or if you need to  reschedule, you may call [facility] at [phone] between the hours of 8:00 am and 5:00 pm, Monday-Friday.    I appreciate the opportunity to care for you. Silvano Rusk, MD. Marval Regal

## 2020-02-19 ENCOUNTER — Ambulatory Visit (HOSPITAL_COMMUNITY)
Admission: RE | Admit: 2020-02-19 | Discharge: 2020-02-19 | Disposition: A | Discharge status: Home / Self Care | Payer: Medicare Other | Source: Ambulatory Visit | Attending: Internal Medicine | Admitting: Internal Medicine

## 2020-02-19 ENCOUNTER — Other Ambulatory Visit: Payer: Self-pay

## 2020-02-19 DIAGNOSIS — R194 Change in bowel habit: Secondary | ICD-10-CM | POA: Diagnosis present

## 2020-02-19 DIAGNOSIS — R10814 Left lower quadrant abdominal tenderness: Secondary | ICD-10-CM | POA: Insufficient documentation

## 2020-02-19 DIAGNOSIS — R1032 Left lower quadrant pain: Secondary | ICD-10-CM | POA: Diagnosis present

## 2020-02-19 DIAGNOSIS — K760 Fatty (change of) liver, not elsewhere classified: Secondary | ICD-10-CM | POA: Diagnosis not present

## 2020-02-19 MED ORDER — SODIUM CHLORIDE (PF) 0.9 % IJ SOLN
INTRAMUSCULAR | Status: AC
  Filled 2020-02-19: qty 50

## 2020-02-19 MED ORDER — IOHEXOL 300 MG/ML  SOLN
100.0000 mL | Freq: Once | INTRAMUSCULAR | Status: AC | PRN
  Administered 2020-02-19: 100 mL via INTRAVENOUS

## 2020-03-02 ENCOUNTER — Other Ambulatory Visit: Payer: Self-pay | Admitting: Internal Medicine

## 2020-03-02 DIAGNOSIS — G245 Blepharospasm: Secondary | ICD-10-CM

## 2020-03-08 ENCOUNTER — Other Ambulatory Visit: Payer: Self-pay

## 2020-03-08 ENCOUNTER — Ambulatory Visit (AMBULATORY_SURGERY_CENTER): Payer: Self-pay | Admitting: *Deleted

## 2020-03-08 VITALS — Ht 64.0 in | Wt 191.0 lb

## 2020-03-08 DIAGNOSIS — R1032 Left lower quadrant pain: Secondary | ICD-10-CM

## 2020-03-08 DIAGNOSIS — R194 Change in bowel habit: Secondary | ICD-10-CM

## 2020-03-08 NOTE — Progress Notes (Signed)
No egg or soy allergy known to patient  No issues with past sedation with any surgeries or procedures no intubation problems in the past  No diet pills per patient No home 02 use per patient  No blood thinners per patient  Pt denies issues with constipation - very occ and uses Miralax PRN - harder stools to get out  No A fib or A flutter  EMMI video to pt or MyChart  COVID 19 guidelines implemented in [facility] today   Cov vacc x 2     Due to the COVID-19 pandemic we are asking patients to follow these guidelines. Please only bring one care partner. Please be aware that your care partner may wait in the car in the parking lot or if they feel like they will be too hot to wait in the car, they may wait in the lobby on the 4th floor. All care partners are required to wear a mask the entire time (we do not have any that we can provide them), they need to practice social distancing, and we will do a Covid check for all patient's and care partners when you arrive. Also we will check their temperature and your temperature. If the care partner waits in their car they need to stay in the parking lot the entire time and we will call them on their cell phone when the patient is ready for discharge so they can bring the car to the front of the building. Also all patient's will need to wear a mask into building.

## 2020-03-15 ENCOUNTER — Telehealth: Payer: Self-pay | Admitting: Internal Medicine

## 2020-03-15 NOTE — Telephone Encounter (Signed)
?   Can she take Xanax Monday morning- YES by 530 am   Is orange Gatorade OK?  YES - no red or Purple   Can she have cranberry juice this week?  YES just no red or purple liquids Sunday or Monday morning- instructed Sunday she can have white cranberry, but NO red-  Pt verbalized understanding to all instructions given todayLelan Pons [facility]

## 2020-03-22 ENCOUNTER — Other Ambulatory Visit: Payer: Self-pay

## 2020-03-22 ENCOUNTER — Ambulatory Visit (AMBULATORY_SURGERY_CENTER): Payer: Medicare Other | Admitting: Internal Medicine

## 2020-03-22 ENCOUNTER — Encounter: Payer: Self-pay | Admitting: Internal Medicine

## 2020-03-22 VITALS — BP 112/75 | HR 66 | Temp 96.9°F | Resp 10 | Ht 64.0 in | Wt 191.0 lb

## 2020-03-22 DIAGNOSIS — E039 Hypothyroidism, unspecified: Secondary | ICD-10-CM | POA: Diagnosis not present

## 2020-03-22 DIAGNOSIS — D122 Benign neoplasm of ascending colon: Secondary | ICD-10-CM

## 2020-03-22 DIAGNOSIS — Z1211 Encounter for screening for malignant neoplasm of colon: Secondary | ICD-10-CM

## 2020-03-22 LAB — HM COLONOSCOPY

## 2020-03-22 MED ORDER — SODIUM CHLORIDE 0.9 % IV SOLN: 500.0000 mL | Freq: Once | INTRAVENOUS | Status: DC

## 2020-03-22 NOTE — Progress Notes (Signed)
Pt's states no medical or surgical changes since previsit or office visit.  V/S-CW  Check-in-LR

## 2020-03-22 NOTE — Progress Notes (Signed)
Pt's states no medical or surgical changes since previsit or office visit.  V/S-CW  Check-in-AR 

## 2020-03-22 NOTE — Op Note (Signed)
[place] Patient Name: Linda Hayes Procedure Date: 03/22/2020 8:02 AM MRN: [id number] Endoscopist: Gatha Mayer , MD Age: 69 Referring MD:  Date of Birth: 1950/09/25 Gender: Female Account #: [id number] Procedure:                Colonoscopy Indications:              Screening for colorectal malignant neoplasm, Last                            colonoscopy: 2007 Medicines:                Propofol per Anesthesia, Monitored Anesthesia Care Procedure:                Pre-Anesthesia Assessment:                           - Prior to the procedure, a History and Physical                            was performed, and patient medications and                            allergies were reviewed. The patient's tolerance of                            previous anesthesia was also reviewed. The risks                            and benefits of the procedure and the sedation                            options and risks were discussed with the patient.                            All questions were answered, and informed consent                            was obtained. Prior Anticoagulants: The patient has                            taken no previous anticoagulant or antiplatelet                            agents. ASA Grade Assessment: III - A patient with                            severe systemic disease. After reviewing the risks                            and benefits, the patient was deemed in                            satisfactory condition to undergo the procedure.  After obtaining informed consent, the colonoscope                            was passed under direct vision. Throughout the                            procedure, the patient's blood pressure, pulse, and                            oxygen saturations were monitored continuously. The                            Colonoscope was introduced through the anus and                            advanced to the  the cecum, identified by                            appendiceal orifice and ileocecal valve. The                            colonoscopy was performed without difficulty. The                            patient tolerated the procedure well. The quality                            of the bowel preparation was good. The bowel                            preparation used was Miralax via split dose                            instruction. The ileocecal valve, appendiceal                            orifice, and rectum were photographed. Scope In: 8:09:24 AM Scope Out: 8:25:46 AM Scope Withdrawal Time: 0 hours 11 minutes 39 seconds  Total Procedure Duration: 0 hours 16 minutes 22 seconds  Findings:                 The perianal and digital rectal examinations were                            normal.                           Two sessile polyps were found in the ascending                            colon. The polyps were diminutive in size. These                            polyps were removed with a cold snare. Resection  and retrieval were complete. Verification of                            patient identification for the specimen was done.                            Estimated blood loss was minimal.                           The exam was otherwise without abnormality on                            direct and retroflexion views. Complications:            No immediate complications. Estimated Blood Loss:     Estimated blood loss was minimal. Impression:               - Two diminutive polyps in the ascending colon,                            removed with a cold snare. Resected and retrieved.                           - The examination was otherwise normal on direct                            and retroflexion views. Recommendation:           - Patient has a contact number available for                            emergencies. The signs and symptoms of potential                             delayed complications were discussed with the                            patient. Return to normal activities tomorrow.                            Written discharge instructions were provided to the                            patient.                           - Resume previous diet.                           - Continue present medications.                           - Repeat colonoscopy is recommended. The                            colonoscopy date will be determined after pathology  results from today's exam become available for                            review. Gatha Mayer, MD 03/22/2020 8:35:20 AM This report has been signed electronically.

## 2020-03-22 NOTE — Patient Instructions (Addendum)
I found and removed 2 tiny polyps. I will let you know pathology results and when/if to have another routine colonoscopy by mail and/or My Chart.  I appreciate the opportunity to care for you. Gatha Mayer, MD, [facility]  Handout provided on polyps.    YOU HAD AN ENDOSCOPIC PROCEDURE TODAY AT [facility]:   Refer to the procedure report that was given to you for any specific questions about what was found during the examination.  If the procedure report does not answer your questions, please call your gastroenterologist to clarify.  If you requested that your care partner not be given the details of your procedure findings, then the procedure report has been included in a sealed envelope for you to review at your convenience later.  YOU SHOULD EXPECT: Some feelings of bloating in the abdomen. Passage of more gas than usual.  Walking can help get rid of the air that was put into your GI tract during the procedure and reduce the bloating. If you had a lower endoscopy (such as a colonoscopy or flexible sigmoidoscopy) you may notice spotting of blood in your stool or on the toilet paper. If you underwent a bowel prep for your procedure, you may not have a normal bowel movement for a few days.  Please Note:  You might notice some irritation and congestion in your nose or some drainage.  This is from the oxygen used during your procedure.  There is no need for concern and it should clear up in a day or so.  SYMPTOMS TO REPORT IMMEDIATELY:   Following lower endoscopy (colonoscopy or flexible sigmoidoscopy):  Excessive amounts of blood in the stool  Significant tenderness or worsening of abdominal pains  Swelling of the abdomen that is new, acute  Fever of 100F or higher  For urgent or emergent issues, a gastroenterologist can be reached at any hour by calling [phone]. Do not use MyChart messaging for urgent concerns.    DIET:  We do recommend a small meal at first, but  then you may proceed to your regular diet.  Drink plenty of fluids but you should avoid alcoholic beverages for 24 hours.  ACTIVITY:  You should plan to take it easy for the rest of today and you should NOT DRIVE or use heavy machinery until tomorrow (because of the sedation medicines used during the test).    FOLLOW UP: Our staff will call the number listed on your records 48-72 hours following your procedure to check on you and address any questions or concerns that you may have regarding the information given to you following your procedure. If we do not reach you, we will leave a message.  We will attempt to reach you two times.  During this call, we will ask if you have developed any symptoms of COVID 19. If you develop any symptoms (ie: fever, flu-like symptoms, shortness of breath, cough etc.) before then, please call [phone].  If you test positive for Covid 19 in the 2 weeks post procedure, please call and report this information to [place].    If any biopsies were taken you will be contacted by phone or by letter within the next 1-3 weeks.  Please call us at [phone] if you have not heard about the biopsies in 3 weeks.    SIGNATURES/CONFIDENTIALITY: You and/or your care partner have signed paperwork which will be entered into your electronic medical record.  These signatures attest to the fact that that the  information above on your After Visit Summary has been reviewed and is understood.  Full responsibility of the confidentiality of this discharge information lies with you and/or your care-partner.

## 2020-03-22 NOTE — Progress Notes (Signed)
Report to PACU, RN, vss, BBS= Clear.  

## 2020-03-22 NOTE — Progress Notes (Signed)
Called to room to assist during endoscopic procedure.  Patient ID and intended procedure confirmed with present staff. Received instructions for my participation in the procedure from the performing physician.  

## 2020-03-24 ENCOUNTER — Telehealth: Payer: Self-pay | Admitting: *Deleted

## 2020-03-24 ENCOUNTER — Telehealth: Payer: Self-pay

## 2020-03-24 NOTE — Telephone Encounter (Signed)
First post procedure follow up call, no answer 

## 2020-03-24 NOTE — Telephone Encounter (Signed)
No answer for post procedure call back. Left message for patient to call back with questions or concerns.

## 2020-03-26 ENCOUNTER — Other Ambulatory Visit: Payer: Self-pay | Admitting: Internal Medicine

## 2020-03-30 ENCOUNTER — Encounter: Payer: Self-pay | Admitting: Internal Medicine

## 2020-05-04 ENCOUNTER — Ambulatory Visit: Payer: Medicare Other | Admitting: Diagnostic Neuroimaging

## 2020-05-06 ENCOUNTER — Ambulatory Visit (INDEPENDENT_AMBULATORY_CARE_PROVIDER_SITE_OTHER): Payer: Medicare Other | Admitting: Neurology

## 2020-05-06 ENCOUNTER — Telehealth: Payer: Self-pay | Admitting: Neurology

## 2020-05-06 ENCOUNTER — Encounter: Payer: Self-pay | Admitting: Neurology

## 2020-05-06 VITALS — BP 128/72 | HR 66 | Ht 64.0 in | Wt 187.5 lb

## 2020-05-06 DIAGNOSIS — G245 Blepharospasm: Secondary | ICD-10-CM

## 2020-05-06 DIAGNOSIS — I890 Lymphedema, not elsewhere classified: Secondary | ICD-10-CM | POA: Diagnosis not present

## 2020-05-06 NOTE — Progress Notes (Signed)
**  Xeomin 100 units x 1 vial, NDC 0259-1610-01, Lot [id number], Exp 07/2021, sample medication used today.//mck,rn**

## 2020-05-06 NOTE — Telephone Encounter (Signed)
medicare order sent to GI. No auth they will reach out to the patient to schedule.  

## 2020-05-06 NOTE — Progress Notes (Signed)
Chief Complaint  Patient presents with  . New Patient (Initial Visit)    She is here with her husband, Linda Hayes. 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 Symptoms started in 2008. She had injections two years ago. Botox did not work as well as Dysport. Says she never tried Xeomin.   [name] PCP    Plotnikov, Evie Lacks, MD    HISTORICAL  Linda Hayes is a 69 year old female, seen in request by primary care physician Dr. 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  I reviewed and summarized the referring note.  Past medical history Depression anxiety, taking Zoloft 100 mg, Xanax 2 mg 3 times a day, Ambien, Hypothyroidism, on supplement,  She began to notice forceful bilateral eye blinking around 2008, after she experienced excessive stress, symptoms gradually getting worse, over the years, she was seen by different neurologist, ophthalmologist, received Botox injection  Was seen by [facility] neurologist Dr. Tyron Russell before 2013, will switch to [facility] ophthalmologist Dr.Woodward since Jan 2014, reviewed the note, severe blepharospasm with Meige's syndrome, and Breyghel syndrome, failed oral medication Xanax, clonazepam, Ritalin, has tried Botox, Dysport, Xeomin in the past, reported as result with Dysport, has to quit driving since 6761 due to severe blepharospasm, trial of Botox injection on October 07 2012, without any effect noticed, repeat injection of 70 units of Botox on Jan 08, 2013, patient reported benefit only last about 3 weeks, injection of June 13, 2013, 200 units of Dysport, repeat injection October 2015, was referred to neurosurgery for consideration of deep brain stimulation in the 2016, repeat Botox 100 units, last injection by Dr. Heide Spark was on March 02, 2016, this for 300 units,  She started seeing [place] neurologist Dr. Wells Guiles Tat since March 2018, short trial of Botox a injection did  not provide significant improvement, there was consideration of botulism toxin type B, myobloc vs deep brain stimulation referral, but was not carried through  She has not received any toxin injection since March 2018, complains of significant bilateral blepharospasm, blocking her vision, no longer driving, gait abnormality because of her visual difficulty, she also has facial muscle twitching, abnormal neck movement, currently on Xanax 2 mg 3 times a day from her primary care physician Dr.Plotnikov, [place]: Full 14 system review of systems performed and notable only for as above All other review of systems were negative.  ALLERGIES: Allergies  Allergen Reactions  . Ciprofloxacin     Caused colitis  . Erythromycin     Intolerance - GI  . Gabapentin     cough  . Hydrocodone Nausea And Vomiting  . Penicillins     rash  . Tramadol     n/v  . Ritalin  [Methylphenidate] Palpitations    HOME MEDICATIONS: Current Outpatient Medications  Medication Sig Dispense Refill  . alprazolam (XANAX) 2 MG tablet TAKE 1 TABLET (2 MG TOTAL) BY MOUTH 3 (THREE) TIMES DAILY AS NEEDED FOR ANXIETY. 90 tablet 3  . sertraline (ZOLOFT) 100 MG tablet TAKE 2 TABLETS BY MOUTH EVERY DAY 180 tablet 3  . SYNTHROID 125 MCG tablet TAKE 1 TABLET BY MOUTH EVERY DAY 30 tablet 11  . zolpidem (AMBIEN CR) 6.25 MG CR tablet TAKE 1 TABLET BY MOUTH EVERYDAY AT BEDTIME 30 tablet 3   No current facility-administered medications for this visit.    PAST MEDICAL HISTORY: Past Medical History:  Diagnosis Date  . ANXIETY 03/17/2008  .  ASTHMATIC BRONCHITIS, ACUTE 10/01/2008  . 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 Cancer [facility]    thyroid cancer   . Cataract    forming both eyes   . Colitis due to Clostridioides difficile 2007  . DEPRESSION 03/17/2008  . GAIT ATAXIA 06/11/2009  . GERD (gastroesophageal reflux disease)    diet related   . Hyperlipidemia   . HYPOTHYROIDISM 03/17/2008  . INSOMNIA, PERSISTENT 03/17/2008   . Meige syndrome (blepharospasm with oromandibular dystonia)   . OBESITY 03/17/2008  . Palpitations 10/01/2008  . SWEATING 03/17/2008    PAST SURGICAL HISTORY: Past Surgical History:  Procedure Laterality Date  . COLONOSCOPY    . THYROIDECTOMY  1999  . TONSILLECTOMY      FAMILY HISTORY: Family History  Problem Relation Age of Onset  . Other Mother        injury related to a fall  . Prostate cancer Father   . Colon polyps Sister   . Hypertension Other   . Depression Other   . Diabetes Other   . Colon cancer Neg Hx   . Esophageal cancer Neg Hx   . Stomach cancer Neg Hx   . Liver disease Neg Hx   . Pancreatic cancer Neg Hx   . Ulcerative colitis Neg Hx     SOCIAL HISTORY: Social History   Socioeconomic History  . Marital status: Married    Spouse name: Not on file  . Number of children: 3  . Years of education: 17  . Highest education level: High school graduate  Occupational History  . Occupation: Disabled  Tobacco Use  . Smoking status: Never Smoker  . Smokeless tobacco: Never Used  Substance and Sexual Activity  . Alcohol use: Not Currently    Alcohol/week: 0.0 standard drinks  . Drug use: No  . Sexual activity: Yes  Other Topics Concern  . 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   Right-handed.    No daily use of caffeine.      Social Determinants of Health   Financial Resource Strain:   . Difficulty of Paying Living Expenses: Not on file  Food Insecurity:   . Worried About Charity fundraiser in the Last Year: Not on file  . Ran Out of Food in the Last Year: Not on file  Transportation Needs:   . Lack of Transportation (Medical): Not on file  . Lack of Transportation (Non-Medical): Not on file  Physical Activity:   . Days of Exercise per Week: Not on file  . Minutes of Exercise per Session: Not on file  Stress:   . Feeling of Stress : Not on file  Social  Connections:   . Frequency of Communication with Friends and Family: Not on file  . Frequency of Social Gatherings with Friends and Family: Not on file  . Attends Religious Services: Not on file  . Active Member of Clubs or Organizations: Not on file  . Attends Archivist Meetings: Not on file  . Marital Status: Not on file  Intimate Partner Violence:   . Fear of Current or Ex-Partner: Not on file  . Emotionally Abused: Not on file  . Physically Abused: Not on file  . Sexually Abused: Not on file     PHYSICAL EXAM   Vitals:   05/06/20 0920  BP: 128/72  Pulse: 66  Weight: 187 lb 8 oz (85 kg)  Height: 5'  4" (1.626 m)   Not recorded     Body mass index is 32.18 kg/m.  PHYSICAL EXAMNIATION:  Gen: NAD, conversant, well nourised, well groomed                     Cardiovascular: Regular rate rhythm, no peripheral edema, warm, nontender. Eyes: Conjunctivae clear without exudates or hemorrhage Neck: Supple, no carotid bruits. Pulmonary: Clear to auscultation bilaterally   NEUROLOGICAL EXAM:  MENTAL STATUS: Speech:    Speech is normal; fluent and spontaneous with normal comprehension.  Cognition:     Orientation to time, place and person     Normal recent and remote memory     Normal Attention span and concentration     Normal Language, naming, repeating,spontaneous speech     Fund of knowledge   CRANIAL NERVES: CN II: Visual fields are full to confrontation. Pupils are round equal and briskly reactive to light. CN III, IV, VI: extraocular movement are normal. No ptosis. CN V: Facial sensation is intact to light touch CN VII: Constant forceful eye closure, blocking her vision, also bilateral cheek muscle movement, neck rotatory shaking movement, CN VIII: Hearing is normal to causal conversation. CN IX, X: Phonation is normal. CN XI: Head turning and shoulder shrug are intact  MOTOR: There is no pronator drift of out-stretched arms. Muscle bulk and tone are  normal. Muscle strength is normal.  REFLEXES: Reflexes are 2+ and symmetric at the biceps, triceps, knees, and ankles. Plantar responses are flexor.  SENSORY: Intact to light touch, pinprick and vibratory sensation are intact in fingers and toes.  COORDINATION: There is no trunk or limb dysmetria noted.  GAIT/STANCE: Need to push-up to get up from seated position.  Cautious due to visual drop from severe blepharospasm Romberg is absent.   DIAGNOSTIC DATA (LABS, IMAGING, TESTING) - I reviewed patient records, labs, notes, testing and imaging myself where available.   ASSESSMENT AND PLAN  LESLIE Hayes is a 69 y.o. female   Severe blepharospasm, meige syndrome  Previously only short benefit from Botox a, Dysport injection, not sure she has tried resuming before,  Preauthorization repeat botulism toxin injection,  MRI of the brain  Give her a trial of Xeomin injection today,  100 units of Xeomin dissolving to 2 cc of normal saline, under EMG guidance  Bilateral orbicularis oculi multiple injection sites 2.5 units each sides, at 2, 3, 4, 5, 6, 7, 8, 9, 10 o'clock position (2.5 units x9=22.5 units each side).  Bilateral corrugate 5 unites Prosource 5 units  Right levator scapula 20 Left levator scapula 20 units   Marcial Pacas, M.D. Ph.D.  [facility] [address], [place], [place] [address] Ph: [phone] Fax: [phone]  CC:  Cassandria Anger, MD [address] [place],  [place] [address]

## 2020-05-13 ENCOUNTER — Ambulatory Visit
Admission: RE | Admit: 2020-05-13 | Discharge: 2020-05-13 | Disposition: A | Discharge status: Home / Self Care | Payer: Medicare Other | Source: Ambulatory Visit | Attending: Neurology | Admitting: Neurology

## 2020-05-13 ENCOUNTER — Other Ambulatory Visit: Payer: Self-pay

## 2020-05-13 DIAGNOSIS — I890 Lymphedema, not elsewhere classified: Secondary | ICD-10-CM

## 2020-05-13 DIAGNOSIS — G245 Blepharospasm: Secondary | ICD-10-CM | POA: Diagnosis not present

## 2020-05-30 ENCOUNTER — Other Ambulatory Visit: Payer: Self-pay | Admitting: Internal Medicine

## 2020-05-31 NOTE — Telephone Encounter (Signed)
Check [place] registry last filled alprazolam 05/01/2020 & Zolpidem last filled 04/30/2020. MD is out of the office pl=s advise.Marland KitchenJohny Chess

## 2020-06-03 ENCOUNTER — Ambulatory Visit (INDEPENDENT_AMBULATORY_CARE_PROVIDER_SITE_OTHER): Payer: Medicare Other | Admitting: Neurology

## 2020-06-03 ENCOUNTER — Encounter: Payer: Self-pay | Admitting: Neurology

## 2020-06-03 ENCOUNTER — Telehealth: Payer: Self-pay | Admitting: Neurology

## 2020-06-03 VITALS — BP 118/76 | HR 68 | Ht 64.0 in | Wt 187.0 lb

## 2020-06-03 DIAGNOSIS — G245 Blepharospasm: Secondary | ICD-10-CM | POA: Diagnosis not present

## 2020-06-03 DIAGNOSIS — I890 Lymphedema, not elsewhere classified: Secondary | ICD-10-CM

## 2020-06-03 MED ORDER — TRIHEXYPHENIDYL HCL 2 MG PO TABS: 2.0000 mg | ORAL_TABLET | Freq: Three times a day (TID) | ORAL | 3 refills | Status: DC

## 2020-06-03 NOTE — Telephone Encounter (Signed)
Will continue Xeomin injection 100 units, use 150 units at next injection  Last injection was on Sept 23 2021.

## 2020-06-03 NOTE — Telephone Encounter (Signed)
Noted. Patient is scheduled for next injection 12/23.

## 2020-06-03 NOTE — Telephone Encounter (Signed)
Noted  

## 2020-06-03 NOTE — Progress Notes (Signed)
Chief Complaint  Patient presents with  . Blepharospasm    She is here with her husband, Linda Hayes. She received a trial injection of Xeomin 100 units on 05/06/20. Estimates 30% improvement in symptoms.    HISTORICAL  Linda Hayes is a 69 year old female, seen in request by primary care physician Dr. 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  I reviewed and summarized the referring note.  Past medical history Depression anxiety, taking Zoloft 100 mg, Xanax 2 mg 3 times a day, Ambien, Hypothyroidism, on supplement,  She began to notice forceful bilateral eye blinking around 2008, after she experienced excessive stress, symptoms gradually getting worse, over the years, she was seen by different neurologist, ophthalmologist, received Botox injection  Was seen by [facility] neurologist Dr. Tyron Russell before 2013, will switch to [facility] ophthalmologist Dr.Woodward since Jan 2014, reviewed the note, severe blepharospasm with Meige's syndrome, and Breyghel syndrome, failed oral medication Xanax, clonazepam, Ritalin, has tried Botox, Dysport, Xeomin in the past, reported as result with Dysport, has to quit driving since 1245 due to severe blepharospasm, trial of Botox injection on October 07 2012, without any effect noticed, repeat injection of 70 units of Botox on Jan 08, 2013, patient reported benefit only last about 3 weeks, injection of June 13, 2013, 200 units of Dysport, repeat injection October 2015, was referred to neurosurgery for consideration of deep brain stimulation in the 2016, repeat Botox 100 units, last injection by Dr. Heide Spark was on March 02, 2016, this for 300 units,  She started seeing [place] neurologist Dr. Wells Guiles Tat since March 2018, short trial of Botox a injection did not provide significant improvement, there was consideration of botulism toxin type B, myobloc vs deep brain stimulation  referral, but was not carried through  She has not received any toxin injection since March 2018, complains of significant bilateral blepharospasm, blocking her vision, no longer driving, gait abnormality because of her visual difficulty, she also has facial muscle twitching, abnormal neck movement, currently on Xanax 2 mg 3 times a day from her primary care physician Dr.Plotnikov, Tyrone Apple   UPDATE Jun 03 2020: She received Xeomin injection on May 06, 2020, reported moderate improvement  100 units of Xeomin dissolving to 2 cc of normal saline, under EMG guidance  Bilateral orbicularis oculi multiple injection sites 2.5 units each sides, at 2, 3, 4, 5, 6, 7, 8, 9, 10 o'clock position (2.5 units x9=22.5 units each side).  Bilateral corrugate 5 unites Prosource 5 units  Right levator scapula 20 Left levator scapula 20 units  She denies side effect, the injection to levator scapula has helped her head titubation as well  REVIEW OF SYSTEMS: Full 14 system review of systems performed and notable only for as above All other review of systems were negative.  ALLERGIES: Allergies  Allergen Reactions  . Ciprofloxacin     Caused colitis  . Erythromycin     Intolerance - GI  . Gabapentin     cough  . Hydrocodone Nausea And Vomiting  . Penicillins     rash  . Tramadol     n/v  . Ritalin  [Methylphenidate] Palpitations    HOME MEDICATIONS: Current Outpatient Medications  Medication Sig Dispense Refill  . alprazolam (XANAX) 2 MG tablet TAKE 1 TABLET (2 MG TOTAL) BY MOUTH 3 (THREE) TIMES DAILY AS NEEDED FOR ANXIETY. 90 tablet 0  . sertraline (ZOLOFT) 100 MG tablet TAKE 2 TABLETS BY MOUTH  EVERY DAY 180 tablet 3  . SYNTHROID 125 MCG tablet TAKE 1 TABLET BY MOUTH EVERY DAY 30 tablet 11  . zolpidem (AMBIEN CR) 6.25 MG CR tablet TAKE 1 TABLET BY MOUTH EVERYDAY AT BEDTIME 30 tablet 0   No current facility-administered medications for this visit.    PAST MEDICAL HISTORY: Past  Medical History:  Diagnosis Date  . ANXIETY 03/17/2008  . ASTHMATIC BRONCHITIS, ACUTE 10/01/2008  . 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 Cancer [facility])    thyroid cancer   . Cataract    forming both eyes   . Colitis due to Clostridioides difficile 2007  . DEPRESSION 03/17/2008  . GAIT ATAXIA 06/11/2009  . GERD (gastroesophageal reflux disease)    diet related   . Hyperlipidemia   . HYPOTHYROIDISM 03/17/2008  . INSOMNIA, PERSISTENT 03/17/2008  . Meige syndrome (blepharospasm with oromandibular dystonia)   . OBESITY 03/17/2008  . Palpitations 10/01/2008  . SWEATING 03/17/2008    PAST SURGICAL HISTORY: Past Surgical History:  Procedure Laterality Date  . COLONOSCOPY    . THYROIDECTOMY  1999  . TONSILLECTOMY      FAMILY HISTORY: Family History  Problem Relation Age of Onset  . Other Mother        injury related to a fall  . Prostate cancer Father   . Colon polyps Sister   . Hypertension Other   . Depression Other   . Diabetes Other   . Colon cancer Neg Hx   . Esophageal cancer Neg Hx   . Stomach cancer Neg Hx   . Liver disease Neg Hx   . Pancreatic cancer Neg Hx   . Ulcerative colitis Neg Hx     SOCIAL HISTORY: Social History   Socioeconomic History  . Marital status: Married    Spouse name: Not on file  . Number of children: 3  . Years of education: 67  . Highest education level: High school graduate  Occupational History  . Occupation: Disabled  Tobacco Use  . Smoking status: Never Smoker  . Smokeless tobacco: Never Used  Substance and Sexual Activity  . Alcohol use: Not Currently    Alcohol/week: 0.0 standard drinks  . Drug use: No  . Sexual activity: Yes  Other Topics Concern  . 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   Right-handed.    No daily use of caffeine.      Social Determinants of Health   Financial Resource Strain:   . Difficulty of Paying Living Expenses: Not on  file  Food Insecurity:   . Worried About Charity fundraiser in the Last Year: Not on file  . Ran Out of Food in the Last Year: Not on file  Transportation Needs:   . Lack of Transportation (Medical): Not on file  . Lack of Transportation (Non-Medical): Not on file  Physical Activity:   . Days of Exercise per Week: Not on file  . Minutes of Exercise per Session: Not on file  Stress:   . Feeling of Stress : Not on file  Social Connections:   . Frequency of Communication with Friends and Family: Not on file  . Frequency of Social Gatherings with Friends and Family: Not on file  . Attends Religious Services: Not on file  . Active Member of Clubs or Organizations: Not on file  . Attends Archivist Meetings: Not on file  . Marital Status: Not  on file  Intimate Partner Violence:   . Fear of Current or Ex-Partner: Not on file  . Emotionally Abused: Not on file  . Physically Abused: Not on file  . Sexually Abused: Not on file     PHYSICAL EXAM   Vitals:   06/03/20 0846  BP: 118/76  Weight: 187 lb (84.8 kg)  Height: 5\' 4"  (1.626 m)   Not recorded     Body mass index is 32.1 kg/m.  PHYSICAL EXAMNIATION:  Gen: NAD, conversant, well nourised, well groomed        back  NEUROLOGICAL EXAM:  MENTAL STATUS: Speech/cognition: Awake alert oriented to history taking and casual conversation   CRANIAL NERVES: CN II: Visual fields are full to confrontation. Pupils are round equal and briskly reactive to light. CN III, IV, VI: extraocular movement are normal. No ptosis. CN V: Facial sensation is intact to light touch CN VII: Flattening of bilateral corrugate, procereus, apparently xeomin injection is effective, still has frequent but much less eye blinking CN VIII: Hearing is normal to causal conversation. CN IX, X: Phonation is normal. CN XI: Head turning and shoulder shrug are intact  MOTOR: There is no pronator drift of out-stretched arms. Muscle bulk and tone are  normal. Muscle strength is normal.  COORDINATION: There is no trunk or limb dysmetria noted.  GAIT/STANCE: Need to push-up to get up from seated position.  Cautious   DIAGNOSTIC DATA (LABS, IMAGING, TESTING) - I reviewed patient records, labs, notes, testing and imaging myself where available.   ASSESSMENT AND PLAN  Linda Hayes is a 69 y.o. female   Severe blepharospasm, meige syndrome  Previously only short benefit from Botox a, Dysport injection, not sure she has tried xeomin before,  Preauthorization repeat botulism toxin injection,  Responding well to EMG guided Xeomin injection on Sept 23 2021, there is apparent skin slough soft at the injection site, indicating that Xeomin is effective  I personally reviewed MRI of the brain in September 2021, generalized atrophy, no acute abnormalities.  Will continue injection in 3 months,  Try Artane 2 mg 3 times a day    Marcial Pacas, M.D. Ph.D.  [facility] [address], [place], [place] [address] Ph: [phone] Fax: [phone]  CC:  Cassandria Anger, MD [address] [place],  [place] [address]

## 2020-06-24 ENCOUNTER — Encounter: Payer: Self-pay | Admitting: Internal Medicine

## 2020-06-29 ENCOUNTER — Other Ambulatory Visit: Payer: Self-pay | Admitting: Family

## 2020-06-29 NOTE — Telephone Encounter (Signed)
Check [place] registry last filled both 05/31/2020.Marland KitchenJohny Hayes

## 2020-06-29 NOTE — Telephone Encounter (Signed)
Please advise 

## 2020-07-19 ENCOUNTER — Other Ambulatory Visit: Payer: Self-pay

## 2020-07-19 ENCOUNTER — Encounter: Payer: Self-pay | Admitting: Internal Medicine

## 2020-07-19 ENCOUNTER — Ambulatory Visit (INDEPENDENT_AMBULATORY_CARE_PROVIDER_SITE_OTHER): Payer: Medicare Other | Admitting: Internal Medicine

## 2020-07-19 VITALS — BP 118/84 | HR 85 | Temp 98.4°F | Wt 178.4 lb

## 2020-07-19 DIAGNOSIS — E890 Postprocedural hypothyroidism: Secondary | ICD-10-CM | POA: Diagnosis not present

## 2020-07-19 DIAGNOSIS — G245 Blepharospasm: Secondary | ICD-10-CM

## 2020-07-19 DIAGNOSIS — R269 Unspecified abnormalities of gait and mobility: Secondary | ICD-10-CM | POA: Diagnosis not present

## 2020-07-19 DIAGNOSIS — Z23 Encounter for immunization: Secondary | ICD-10-CM | POA: Diagnosis not present

## 2020-07-19 DIAGNOSIS — F419 Anxiety disorder, unspecified: Secondary | ICD-10-CM | POA: Diagnosis not present

## 2020-07-19 LAB — COMPREHENSIVE METABOLIC PANEL
ALT: 12 U/L (ref 0–35)
AST: 19 U/L (ref 0–37)
Albumin: 4.3 g/dL (ref 3.5–5.2)
Alkaline Phosphatase: 65 U/L (ref 39–117)
BUN: 15 mg/dL (ref 6–23)
CO2: 27 mEq/L (ref 19–32)
Calcium: 9.3 mg/dL (ref 8.4–10.5)
Chloride: 105 mEq/L (ref 96–112)
Creatinine, Ser: 0.88 mg/dL (ref 0.40–1.20)
GFR: 67.54 mL/min (ref >60.00)
Glucose, Bld: 92 mg/dL (ref 70–99)
Potassium: 4 mEq/L (ref 3.5–5.1)
Sodium: 140 mEq/L (ref 135–145)
Total Bilirubin: 0.7 mg/dL (ref 0.2–1.2)
Total Protein: 7.8 g/dL (ref 6.0–8.3)

## 2020-07-19 LAB — CBC WITH DIFFERENTIAL/PLATELET
Basophils Absolute: 0.1 10*3/uL (ref 0.0–0.1)
Basophils Relative: 1.2 % (ref 0.0–3.0)
Eosinophils Absolute: 0.2 10*3/uL (ref 0.0–0.7)
Eosinophils Relative: 2.3 % (ref 0.0–5.0)
HCT: 42.8 % (ref 36.0–46.0)
Hemoglobin: 14.9 g/dL (ref 12.0–15.0)
Lymphocytes Relative: 34.5 % (ref 12.0–46.0)
Lymphs Abs: 3.1 10*3/uL (ref 0.7–4.0)
MCHC: 34.8 g/dL (ref 30.0–36.0)
MCV: 93.6 fl (ref 78.0–100.0)
Monocytes Absolute: 0.6 10*3/uL (ref 0.1–1.0)
Monocytes Relative: 6.4 % (ref 3.0–12.0)
Neutro Abs: 5 10*3/uL (ref 1.4–7.7)
Neutrophils Relative %: 55.6 % (ref 43.0–77.0)
Platelets: 294 10*3/uL (ref 150.0–400.0)
RBC: 4.57 Mil/uL (ref 3.87–5.11)
RDW: 13.9 % (ref 11.5–15.5)
WBC: 8.9 10*3/uL (ref 4.0–10.5)

## 2020-07-19 LAB — TSH: TSH: 0.57 u[IU]/mL (ref 0.35–4.50)

## 2020-07-19 MED ORDER — VITAMIN D3 50 MCG (2000 UT) PO CAPS: 2000.0000 [IU] | ORAL_CAPSULE | Freq: Every day | ORAL | 3 refills | Status: AC

## 2020-07-19 MED ORDER — ALPRAZOLAM 2 MG PO TABS
2.0000 mg | ORAL_TABLET | Freq: Three times a day (TID) | ORAL | 5 refills | Status: DC | PRN
Start: 2020-07-19 — End: 2020-08-01

## 2020-07-19 NOTE — Assessment & Plan Note (Addendum)
Stable Botox inj Try Lion's mane

## 2020-07-19 NOTE — Addendum Note (Signed)
Addended by: Boris Lown B on: 07/19/2020 09:53 AM   Modules accepted: Orders

## 2020-07-19 NOTE — Telephone Encounter (Signed)
Pt is already here for appt.Marland KitchenJohny Hayes

## 2020-07-19 NOTE — Assessment & Plan Note (Signed)
On Levothroid 

## 2020-07-19 NOTE — Assessment & Plan Note (Signed)
Xanax prn, Zoloft

## 2020-07-19 NOTE — Progress Notes (Signed)
Subjective:  Patient ID: Linda Hayes, female    DOB: 1950/09/26  Age: 69 y.o. MRN: [id number]  CC: Follow-up (6 month f/u- Flu shot)   HPI Linda Hayes presents for tremor, anxiety, hypothyroidism  Outpatient Medications Prior to Visit  Medication Sig Dispense Refill  . alprazolam (XANAX) 2 MG tablet TAKE 1 TABLET (2 MG TOTAL) BY MOUTH 3 (THREE) TIMES DAILY AS NEEDED FOR ANXIETY. 90 tablet 0  . sertraline (ZOLOFT) 100 MG tablet TAKE 2 TABLETS BY MOUTH EVERY DAY 180 tablet 3  . SYNTHROID 125 MCG tablet TAKE 1 TABLET BY MOUTH EVERY DAY 30 tablet 11  . trihexyphenidyl (ARTANE) 2 MG tablet Take 1 tablet (2 mg total) by mouth 3 (three) times daily with meals. 90 tablet 3  . zolpidem (AMBIEN CR) 6.25 MG CR tablet TAKE 1 TABLET BY MOUTH EVERYDAY AT BEDTIME 30 tablet 0   No facility-administered medications prior to visit.    ROS: Review of Systems  Constitutional: Negative for activity change, appetite change, chills, fatigue and unexpected weight change.  HENT: Negative for congestion, mouth sores and sinus pressure.   Eyes: Negative for visual disturbance.  Respiratory: Negative for cough and chest tightness.   Gastrointestinal: Negative for abdominal pain and nausea.  Genitourinary: Negative for difficulty urinating, frequency and vaginal pain.  Musculoskeletal: Positive for neck stiffness. Negative for back pain and gait problem.  Skin: Negative for pallor and rash.  Neurological: Positive for tremors. Negative for dizziness, weakness, numbness and headaches.  Psychiatric/Behavioral: Positive for dysphoric mood. Negative for confusion, sleep disturbance and suicidal ideas.    Objective:  BP 118/84 (BP Location: Left Arm)   Pulse 85   Temp 98.4 F (36.9 C) (Oral)   Wt 178 lb 6.4 oz (80.9 kg)   SpO2 94%   BMI 30.62 kg/m   BP Readings from Last 3 Encounters:  07/19/20 118/84  06/03/20 118/76  05/06/20 128/72    Wt Readings from Last 3 Encounters:  07/19/20 178 lb  6.4 oz (80.9 kg)  06/03/20 187 lb (84.8 kg)  05/06/20 187 lb 8 oz (85 kg)    Physical Exam Constitutional:      General: She is not in acute distress.    Appearance: She is well-developed.  HENT:     Head: Normocephalic.     Right Ear: External ear normal.     Left Ear: External ear normal.     Nose: Nose normal.  Eyes:     General:        Right eye: No discharge.        Left eye: No discharge.     Conjunctiva/sclera: Conjunctivae normal.     Pupils: Pupils are equal, round, and reactive to light.  Neck:     Thyroid: No thyromegaly.     Vascular: No JVD.     Trachea: No tracheal deviation.  Cardiovascular:     Rate and Rhythm: Normal rate and regular rhythm.     Heart sounds: Normal heart sounds.  Pulmonary:     Effort: No respiratory distress.     Breath sounds: No stridor. No wheezing.  Abdominal:     General: Bowel sounds are normal. There is no distension.     Palpations: Abdomen is soft. There is no mass.     Tenderness: There is no abdominal tenderness. There is no guarding or rebound.  Musculoskeletal:        General: No tenderness.     Cervical back: Normal range of  motion and neck supple.  Lymphadenopathy:     Cervical: No cervical adenopathy.  Skin:    Findings: No erythema or rash.  Neurological:     Mental Status: She is oriented to person, place, and time.     Cranial Nerves: No cranial nerve deficit.     Motor: No abnormal muscle tone.     Coordination: Coordination normal.     Deep Tendon Reflexes: Reflexes normal.  Psychiatric:        Behavior: Behavior normal.        Thought Content: Thought content normal.        Judgment: Judgment normal.     Lab Results  Component Value Date   WBC 9.6 02/09/2020   HGB 15.1 (H) 02/09/2020   HCT 43.3 02/09/2020   PLT 318.0 02/09/2020   GLUCOSE 94 02/09/2020   CHOL 243 (H) 01/15/2020   TRIG 222.0 (H) 01/15/2020   HDL 36.10 (L) 01/15/2020   LDLDIRECT 166.0 01/15/2020   ALT 14 02/09/2020   AST 17  02/09/2020   NA 139 02/09/2020   K 4.2 02/09/2020   CL 105 02/09/2020   CREATININE 0.89 02/09/2020   BUN 15 02/09/2020   CO2 27 02/09/2020   TSH 0.39 01/15/2020    MR BRAIN WO CONTRAST  Result Date: 05/14/2020  [facility] [address] [place], [place] [address] [phone] NEUROIMAGING REPORT STUDY DATE: 05/13/2020 PATIENT NAME: Linda Hayes DOB: 1951/03/14 MRN: [id number] EXAM: MRI Brain without contrast ORDERING CLINICIAN: Marcial Pacas MD, PhD CLINICAL HISTORY: 69 year old woman with blepharospasm COMPARISON FILMS: None TECHNIQUE: MRI of the brain without contrast was obtained utilizing 5 mm axial slices with T1, T2, T2 flair, SWI and diffusion weighted views.  T1 sagittal and T2 coronal views were obtained. CONTRAST: none IMAGING SITE: [facility], [place], [place] FINDINGS: On sagittal images, the spinal cord is imaged caudally to C3 and is normal in caliber.   The contents of the posterior fossa are of normal size and position.   The pituitary gland and optic chiasm appear normal.    Mild generalized cortical atrophy, normal for age.   The ventricles are normal in size and without distortion.  There are no abnormal extra-axial collections of fluid.  The cerebellum and brainstem appears normal.   The deep gray matter appears normal.  There are a few scattered T2/FLAIR hyperintense foci in the subcortical or deep white matter.  None of the foci appear to be acute..  Diffusion weighted images are normal.  Susceptibility weighted images are normal.  The orbits appear normal.   The VIIth/VIIIth nerve complex appears normal.  The mastoid air cells appear normal.  The paranasal sinuses appear normal.  Flow voids are identified within the major intracerebral arteries.     This MRI of the brain without contrast shows the following: 1.  Mild generalized cortical atrophy, typical for age. 2.  Few scattered T2/FLAIR hyperintense foci in the hemispheres consistent  with minimal age-appropriate chronic microvascular ischemic change.  None of the foci appear to be acute. 3.  There were no acute findings. INTERPRETING PHYSICIAN: Richard A. Felecia Shelling, MD, PhD, FAAN Certified in  Neuroimaging by [employer]    Assessment & Plan:    Walker Kehr, MD

## 2020-07-19 NOTE — Patient Instructions (Signed)
   B-complex with Niacin 100 mg    Lion's mane  

## 2020-07-19 NOTE — Assessment & Plan Note (Signed)
Try Lion's mane 

## 2020-07-21 NOTE — Telephone Encounter (Signed)
Patient called in wanting to reschedule her 12/23 appointment to February due to a busy schedule. I rescheduled patient to 2/15 at 11:30 because patient requested a morning appointment. Please let me know if this is not an appropriate change.

## 2020-07-29 ENCOUNTER — Telehealth: Payer: Self-pay | Admitting: Internal Medicine

## 2020-07-29 ENCOUNTER — Other Ambulatory Visit: Payer: Self-pay | Admitting: Internal Medicine

## 2020-07-29 NOTE — Telephone Encounter (Signed)
Duplicate request MD has already received escript request just waiting on approval../lmb

## 2020-07-29 NOTE — Telephone Encounter (Signed)
1.Medication Requested: zolpidem (AMBIEN CR) 6.25 MG CR tablet  2. Pharmacy (Name, Street, [place]): [id number] [place], [place] (Ph: [phone])  3. On Med List: Y  4. Last Visit with PCP:  12.6.21  5. Next visit date with PCP:  n/a   Agent: Please be advised that RX refills may take up to 3 business days. We ask that you follow-up with your pharmacy.

## 2020-08-05 ENCOUNTER — Ambulatory Visit: Payer: Medicare Other | Admitting: Neurology

## 2020-09-27 ENCOUNTER — Telehealth: Payer: Self-pay | Admitting: Internal Medicine

## 2020-09-27 NOTE — Telephone Encounter (Signed)
LVM for pt to rtn my call to schedule AWV with [facility]. Please schedule AWV if pt calls the office  

## 2020-09-28 ENCOUNTER — Ambulatory Visit (INDEPENDENT_AMBULATORY_CARE_PROVIDER_SITE_OTHER): Payer: Medicare Other | Admitting: Neurology

## 2020-09-28 VITALS — BP 100/70 | HR 97 | Ht 64.0 in | Wt 187.6 lb

## 2020-09-28 DIAGNOSIS — M542 Cervicalgia: Secondary | ICD-10-CM | POA: Diagnosis not present

## 2020-09-28 DIAGNOSIS — G245 Blepharospasm: Secondary | ICD-10-CM

## 2020-09-28 NOTE — Progress Notes (Signed)
Chief Complaint  Patient presents with  . xeomin    RM 16. 100U vial x1: lot: [id number], exp: 10/23, NDC: 9379-0240-97 50U vialx1: Lot: [id number], exp: 04/23, NDC: 2426-8341-96    HISTORICAL  TAYRA DAWE is a 70 year old female, seen in request by primary care physician Dr. 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  I reviewed and summarized the referring note.  Past medical history Depression anxiety, taking Zoloft 100 mg, Xanax 2 mg 3 times a day, Ambien, Hypothyroidism, on supplement,  She began to notice forceful bilateral eye blinking around 2008, after she experienced excessive stress, symptoms gradually getting worse, over the years, she was seen by different neurologist, ophthalmologist, received Botox injection  Was seen by [facility] neurologist Dr. Tyron Russell before 2013, will switch to [facility] ophthalmologist Dr.Woodward since Jan 2014, reviewed the note, severe blepharospasm with Meige's syndrome, and Breyghel syndrome, failed oral medication Xanax, clonazepam, Ritalin, has tried Botox, Dysport, Xeomin in the past, reported as result with Dysport, has to quit driving since 2229 due to severe blepharospasm, trial of Botox injection on October 07 2012, without any effect noticed, repeat injection of 70 units of Botox on Jan 08, 2013, patient reported benefit only last about 3 weeks, injection of June 13, 2013, 200 units of Dysport, repeat injection October 2015, was referred to neurosurgery for consideration of deep brain stimulation in the 2016, repeat Botox 100 units, last injection by Dr. Heide Spark was on March 02, 2016, this for 300 units,  She started seeing [facility] neurologist Dr. Wells Guiles Tat since March 2018, short trial of Botox a injection did not provide significant improvement, there was consideration of botulism toxin type B, myobloc vs deep brain stimulation referral, but was not carried  through  She has not received any toxin injection since March 2018, complains of significant bilateral blepharospasm, blocking her vision, no longer driving, gait abnormality because of her visual difficulty, she also has facial muscle twitching, abnormal neck movement, currently on Xanax 2 mg 3 times a day from her primary care physician Dr.Plotnikov, Tyrone Apple   UPDATE Jun 03 2020: She received Xeomin injection on May 06, 2020, reported moderate improvement  100 units of Xeomin dissolving to 2 cc of normal saline, under EMG guidance  Bilateral orbicularis oculi multiple injection sites 2.5 units each sides, at 2, 3, 4, 5, 6, 7, 8, 9, 10 o'clock position (2.5 units x9=22.5 units each side).  Bilateral corrugate 5 unites Prosource 5 units  Right levator scapula 20 Left levator scapula 20 units  She denies side effect, the injection to levator scapula has helped her head titubation as well  UPDATE Sep 28 2020: She did well with previous injection, it also helped her head titubation, but noticed wearing off after 6 to 8 weeks  REVIEW OF SYSTEMS: Full 14 system review of systems performed and notable only for as above All other review of systems were negative.  ALLERGIES: Allergies  Allergen Reactions  . Ciprofloxacin     Caused colitis  . Erythromycin     Intolerance - GI  . Gabapentin     cough  . Hydrocodone Nausea And Vomiting  . Penicillins     rash  . Tramadol     n/v  . Ritalin  [Methylphenidate] Palpitations    HOME MEDICATIONS: Current Outpatient Medications  Medication Sig Dispense Refill  . alprazolam (XANAX) 2 MG tablet TAKE 1 TABLET (2 MG TOTAL) BY MOUTH 3 (  THREE) TIMES DAILY AS NEEDED FOR ANXIETY. 90 tablet 5  . Cholecalciferol (VITAMIN D3) 50 MCG (2000 UT) capsule Take 1 capsule (2,000 Units total) by mouth daily. 100 capsule 3  . sertraline (ZOLOFT) 100 MG tablet TAKE 2 TABLETS BY MOUTH EVERY DAY 180 tablet 3  . SYNTHROID 125 MCG tablet TAKE 1 TABLET  BY MOUTH EVERY DAY 90 tablet 3  . zolpidem (AMBIEN CR) 6.25 MG CR tablet TAKE 1 TABLET BY MOUTH EVERYDAY AT BEDTIME 30 tablet 3   No current facility-administered medications for this visit.    PAST MEDICAL HISTORY: Past Medical History:  Diagnosis Date  . ANXIETY 03/17/2008  . ASTHMATIC BRONCHITIS, ACUTE 10/01/2008  . 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 Cancer [facility])    thyroid cancer   . Cataract    forming both eyes   . Colitis due to Clostridioides difficile 2007  . DEPRESSION 03/17/2008  . GAIT ATAXIA 06/11/2009  . GERD (gastroesophageal reflux disease)    diet related   . Hyperlipidemia   . HYPOTHYROIDISM 03/17/2008  . INSOMNIA, PERSISTENT 03/17/2008  . Meige syndrome (blepharospasm with oromandibular dystonia)   . OBESITY 03/17/2008  . Palpitations 10/01/2008  . SWEATING 03/17/2008    PAST SURGICAL HISTORY: Past Surgical History:  Procedure Laterality Date  . COLONOSCOPY    . THYROIDECTOMY  1999  . TONSILLECTOMY      FAMILY HISTORY: Family History  Problem Relation Age of Onset  . Other Mother        injury related to a fall  . Prostate cancer Father   . Colon polyps Sister   . Hypertension Other   . Depression Other   . Diabetes Other   . Colon cancer Neg Hx   . Esophageal cancer Neg Hx   . Stomach cancer Neg Hx   . Liver disease Neg Hx   . Pancreatic cancer Neg Hx   . Ulcerative colitis Neg Hx     SOCIAL HISTORY: Social History   Socioeconomic History  . Marital status: Married    Spouse name: Not on file  . Number of children: 3  . Years of education: 55  . Highest education level: High school graduate  Occupational History  . Occupation: Disabled  Tobacco Use  . Smoking status: Never Smoker  . Smokeless tobacco: Never Used  Substance and Sexual Activity  . Alcohol use: Not Currently    Alcohol/week: 0.0 standard drinks  . Drug use: No  . Sexual activity: Yes  Other Topics Concern  . 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   Right-handed.    No daily use of caffeine.      Social Determinants of Health   Financial Resource Strain: Not on file  Food Insecurity: Not on file  Transportation Needs: Not on file  Physical Activity: Not on file  Stress: Not on file  Social Connections: Not on file  Intimate Partner Violence: Not on file     PHYSICAL EXAM   Vitals:   09/28/20 1040  BP: 100/70  Pulse: 97  SpO2: 95%  Weight: 187 lb 9.6 oz (85.1 kg)  Height: 5\' 4"  (1.626 m)   Not recorded     Body mass index is 32.2 kg/m.  PHYSICAL EXAMNIATION:  Flattening of bilateral corrugate, procereus, apparently xeomin injection is effective, still has frequent eye blinking, extracting to lower face muscles  ASSESSMENT AND PLAN  Glynis Smiles  Marts is a 70 y.o. female   Severe blepharospasm, meige syndrome  Previously only short benefit from Botox a, Dysport injection, not sure she has tried xeomin before,  Preauthorization repeat botulism toxin injection,  Responding well to EMG guided Xeomin injection on Sept 23 2021, there is apparent skin slough soft at the injection site, indicating that Xeomin is effective  I personally reviewed MRI of the brain in September 2021, generalized atrophy, no acute abnormalities.  Will continue injection in 3 months,  Try Artane 2 mg 3 times a day  EMG guided Xeomin injection, uses Xeomin 150 units  Bilateral orbicularis oculi multiple injection sites 2.5 units each sides, at 2, 3, 4, 5, 6, 7, 8, 9, 10 o'clock position (2.5 units x9=22.5 units each side).  Bilateral corrugate 5 unites Prosource 5 units  Right levator scapula 20 Left levator scapula 20 units  Right splenius cervix 25 units Left splenius cervix 25 units    Marcial Pacas, M.D. Ph.D.  [facility] [place], [place] [address] Phone: [phone] Fax:      [phone]    CC:  Cassandria Anger, MD [address] [place],  [place] [address]

## 2020-10-01 ENCOUNTER — Telehealth: Payer: Self-pay | Admitting: Neurology

## 2020-10-01 DIAGNOSIS — M542 Cervicalgia: Secondary | ICD-10-CM | POA: Insufficient documentation

## 2020-10-01 MED ORDER — INCOBOTULINUMTOXINA 50 UNITS IM SOLR
50.0000 [IU] | INTRAMUSCULAR | Status: DC
  Administered 2020-10-04: 50 [IU] via INTRAMUSCULAR

## 2020-10-01 MED ORDER — INCOBOTULINUMTOXINA 100 UNITS IM SOLR
100.0000 [IU] | INTRAMUSCULAR | Status: DC
  Administered 2020-10-04: 100 [IU] via INTRAMUSCULAR

## 2020-10-01 NOTE — Telephone Encounter (Signed)
Was her xeomin buy and bill, 50+100, please provide info

## 2020-10-04 DIAGNOSIS — M542 Cervicalgia: Secondary | ICD-10-CM | POA: Diagnosis not present

## 2020-10-04 DIAGNOSIS — G245 Blepharospasm: Secondary | ICD-10-CM | POA: Diagnosis not present

## 2020-10-04 NOTE — Telephone Encounter (Signed)
Yes, patient has Medicare

## 2020-10-04 NOTE — Telephone Encounter (Signed)
Here is the info: 100U vial x1: lot: [id number], exp: 10/23, NDC: 1916-6060-04  50U vialx1: Lot: [id number], exp: 04/23, NDC: 1423-9532-02  It is under chief complaint in her office note.

## 2020-11-23 ENCOUNTER — Other Ambulatory Visit: Payer: Self-pay | Admitting: Internal Medicine

## 2020-11-29 ENCOUNTER — Other Ambulatory Visit: Payer: Self-pay

## 2020-11-30 ENCOUNTER — Other Ambulatory Visit: Payer: Self-pay | Admitting: Internal Medicine

## 2020-11-30 ENCOUNTER — Ambulatory Visit: Payer: Medicare Other | Admitting: Internal Medicine

## 2020-11-30 MED ORDER — ZOLPIDEM TARTRATE ER 6.25 MG PO TBCR
EXTENDED_RELEASE_TABLET | ORAL | 1 refills | Status: DC
Start: 2020-11-30 — End: 2021-01-18

## 2020-11-30 NOTE — Telephone Encounter (Signed)
Patient calling for status of refill for zolpidem (AMBIEN CR) 6.25 MG CR tablet   She has 2 pills remaining

## 2020-12-29 ENCOUNTER — Ambulatory Visit: Payer: Medicare Other | Admitting: Neurology

## 2021-01-17 ENCOUNTER — Other Ambulatory Visit: Payer: Self-pay

## 2021-01-18 ENCOUNTER — Encounter: Payer: Self-pay | Admitting: Internal Medicine

## 2021-01-18 ENCOUNTER — Ambulatory Visit (INDEPENDENT_AMBULATORY_CARE_PROVIDER_SITE_OTHER): Payer: Medicare Other | Admitting: Internal Medicine

## 2021-01-18 DIAGNOSIS — F419 Anxiety disorder, unspecified: Secondary | ICD-10-CM | POA: Diagnosis not present

## 2021-01-18 DIAGNOSIS — E890 Postprocedural hypothyroidism: Secondary | ICD-10-CM | POA: Diagnosis not present

## 2021-01-18 DIAGNOSIS — G245 Blepharospasm: Secondary | ICD-10-CM | POA: Diagnosis not present

## 2021-01-18 DIAGNOSIS — F5104 Psychophysiologic insomnia: Secondary | ICD-10-CM | POA: Diagnosis not present

## 2021-01-18 MED ORDER — ZOLPIDEM TARTRATE ER 6.25 MG PO TBCR
EXTENDED_RELEASE_TABLET | ORAL | 1 refills | Status: DC
Start: 2021-01-18 — End: 2021-07-18

## 2021-01-18 MED ORDER — ALPRAZOLAM 2 MG PO TABS
2.0000 mg | ORAL_TABLET | Freq: Three times a day (TID) | ORAL | 1 refills | Status: DC | PRN
Start: 2021-01-18 — End: 2021-07-18

## 2021-01-18 NOTE — Assessment & Plan Note (Signed)
Stable F/u w/Neurology

## 2021-01-18 NOTE — Assessment & Plan Note (Signed)
Cont w/ Levothroid 

## 2021-01-18 NOTE — Progress Notes (Signed)
Subjective:  Patient ID: Linda Hayes, female    DOB: Feb 10, 1951  Age: 70 y.o. MRN: [id number]  CC: Follow-up (6 month f/u- Req refills on alprazolam, and ambien)   HPI Linda Hayes presents for anxiety, tremors, insomnia f/u  Outpatient Medications Prior to Visit  Medication Sig Dispense Refill  . alprazolam (XANAX) 2 MG tablet TAKE 1 TABLET (2 MG TOTAL) BY MOUTH 3 (THREE) TIMES DAILY AS NEEDED FOR ANXIETY. 90 tablet 5  . Cholecalciferol (VITAMIN D3) 50 MCG (2000 UT) capsule Take 1 capsule (2,000 Units total) by mouth daily. 100 capsule 3  . sertraline (ZOLOFT) 100 MG tablet TAKE 2 TABLETS BY MOUTH EVERY DAY 180 tablet 3  . SYNTHROID 125 MCG tablet TAKE 1 TABLET BY MOUTH EVERY DAY 90 tablet 3  . zolpidem (AMBIEN CR) 6.25 MG CR tablet 1 po qhs prn 30 tablet 1  . incobotulinumtoxinA (XEOMIN) 100 units injection 100 Units     . incobotulinumtoxinA (XEOMIN) 50 units injection 50 Units      No facility-administered medications prior to visit.    ROS: Review of Systems  Constitutional: Positive for fatigue. Negative for activity change, appetite change, chills and unexpected weight change.  HENT: Negative for congestion, mouth sores and sinus pressure.   Eyes: Negative for visual disturbance.  Respiratory: Negative for cough and chest tightness.   Gastrointestinal: Negative for abdominal pain and nausea.  Genitourinary: Negative for difficulty urinating, frequency and vaginal pain.  Musculoskeletal: Negative for back pain and gait problem.  Skin: Negative for pallor and rash.  Neurological: Positive for tremors. Negative for dizziness, weakness, numbness and headaches.  Psychiatric/Behavioral: Positive for dysphoric mood. Negative for confusion, self-injury, sleep disturbance and suicidal ideas. The patient is nervous/anxious.     Objective:  BP 110/76 (BP Location: Left Arm)   Pulse 98   Temp (!) 93 F (33.9 C) (Oral)   Ht 5\' 4"  (1.626 m)   Wt 188 lb 3.2 oz (85.4 kg)    SpO2 93%   BMI 32.30 kg/m   BP Readings from Last 3 Encounters:  01/18/21 110/76  09/28/20 100/70  07/19/20 118/84    Wt Readings from Last 3 Encounters:  01/18/21 188 lb 3.2 oz (85.4 kg)  09/28/20 187 lb 9.6 oz (85.1 kg)  07/19/20 178 lb 6.4 oz (80.9 kg)    Physical Exam Constitutional:      General: She is not in acute distress.    Appearance: She is well-developed. She is obese.  HENT:     Head: Normocephalic.     Right Ear: External ear normal.     Left Ear: External ear normal.     Nose: Nose normal.  Eyes:     General:        Right eye: No discharge.        Left eye: No discharge.     Conjunctiva/sclera: Conjunctivae normal.     Pupils: Pupils are equal, round, and reactive to light.  Neck:     Thyroid: No thyromegaly.     Vascular: No JVD.     Trachea: No tracheal deviation.  Cardiovascular:     Rate and Rhythm: Normal rate and regular rhythm.     Heart sounds: Normal heart sounds.  Pulmonary:     Effort: No respiratory distress.     Breath sounds: No stridor. No wheezing.  Abdominal:     General: Bowel sounds are normal. There is no distension.     Palpations: Abdomen is soft. There  is no mass.     Tenderness: There is no abdominal tenderness. There is no guarding or rebound.  Musculoskeletal:        General: No tenderness.     Cervical back: Normal range of motion and neck supple.  Lymphadenopathy:     Cervical: No cervical adenopathy.  Skin:    Findings: No erythema or rash.  Neurological:     Mental Status: She is oriented to person, place, and time.     Cranial Nerves: No cranial nerve deficit.     Motor: No abnormal muscle tone.     Coordination: Coordination abnormal.     Gait: Gait abnormal.     Deep Tendon Reflexes: Reflexes normal.  Psychiatric:        Behavior: Behavior normal.        Thought Content: Thought content normal.        Judgment: Judgment normal.   Tremor in the head, hands  Lab Results  Component Value Date   WBC 8.9  07/19/2020   HGB 14.9 07/19/2020   HCT 42.8 07/19/2020   PLT 294.0 07/19/2020   GLUCOSE 92 07/19/2020   CHOL 243 (H) 01/15/2020   TRIG 222.0 (H) 01/15/2020   HDL 36.10 (L) 01/15/2020   LDLDIRECT 166.0 01/15/2020   ALT 12 07/19/2020   AST 19 07/19/2020   NA 140 07/19/2020   K 4.0 07/19/2020   CL 105 07/19/2020   CREATININE 0.88 07/19/2020   BUN 15 07/19/2020   CO2 27 07/19/2020   TSH 0.57 07/19/2020    MR BRAIN WO CONTRAST  Result Date: 05/14/2020  [facility] [address] [place], [place] [address] [phone] NEUROIMAGING REPORT STUDY DATE: 05/13/2020 PATIENT NAME: Linda Hayes DOB: October 04, 1950 MRN: [id number] EXAM: MRI Brain without contrast ORDERING CLINICIAN: Marcial Pacas MD, PhD CLINICAL HISTORY: 70 year old woman with blepharospasm COMPARISON FILMS: None TECHNIQUE: MRI of the brain without contrast was obtained utilizing 5 mm axial slices with T1, T2, T2 flair, SWI and diffusion weighted views.  T1 sagittal and T2 coronal views were obtained. CONTRAST: none IMAGING SITE: [facility], [place], [place] FINDINGS: On sagittal images, the spinal cord is imaged caudally to C3 and is normal in caliber.   The contents of the posterior fossa are of normal size and position.   The pituitary gland and optic chiasm appear normal.    Mild generalized cortical atrophy, normal for age.   The ventricles are normal in size and without distortion.  There are no abnormal extra-axial collections of fluid.  The cerebellum and brainstem appears normal.   The deep gray matter appears normal.  There are a few scattered T2/FLAIR hyperintense foci in the subcortical or deep white matter.  None of the foci appear to be acute..  Diffusion weighted images are normal.  Susceptibility weighted images are normal.  The orbits appear normal.   The VIIth/VIIIth nerve complex appears normal.  The mastoid air cells appear normal.  The paranasal sinuses appear normal.  Flow voids  are identified within the major intracerebral arteries.     This MRI of the brain without contrast shows the following: 1.  Mild generalized cortical atrophy, typical for age. 2.  Few scattered T2/FLAIR hyperintense foci in the hemispheres consistent with minimal age-appropriate chronic microvascular ischemic change.  None of the foci appear to be acute. 3.  There were no acute findings. INTERPRETING PHYSICIAN: Richard A. Felecia Shelling, MD, PhD, FAAN Certified in  [place] by [employer]  Assessment & Plan:    Walker Kehr, MD

## 2021-01-18 NOTE — Assessment & Plan Note (Signed)
Cont w/Zolpidem at hs  Potential benefits of a long term benzodiazepines  use as well as potential risks  and complications were explained to the patient and were aknowledged. 

## 2021-01-18 NOTE — Assessment & Plan Note (Signed)
Cont w/Xanax prn, Zoloft   Potential benefits of a long term benzodiazepines  use as well as potential risks  and complications were explained to the patient and were aknowledged.

## 2021-02-24 ENCOUNTER — Other Ambulatory Visit: Payer: Self-pay | Admitting: Internal Medicine

## 2021-04-20 ENCOUNTER — Encounter: Payer: Self-pay | Admitting: Neurology

## 2021-04-20 ENCOUNTER — Other Ambulatory Visit: Payer: Self-pay

## 2021-04-20 ENCOUNTER — Ambulatory Visit (INDEPENDENT_AMBULATORY_CARE_PROVIDER_SITE_OTHER): Payer: Medicare Other | Admitting: Neurology

## 2021-04-20 VITALS — BP 121/78 | HR 90 | Ht 64.0 in | Wt 187.0 lb

## 2021-04-20 DIAGNOSIS — M542 Cervicalgia: Secondary | ICD-10-CM

## 2021-04-20 DIAGNOSIS — G245 Blepharospasm: Secondary | ICD-10-CM | POA: Diagnosis not present

## 2021-04-20 DIAGNOSIS — I890 Lymphedema, not elsewhere classified: Secondary | ICD-10-CM

## 2021-04-20 MED ORDER — INCOBOTULINUMTOXINA 100 UNITS IM SOLR
100.0000 [IU] | INTRAMUSCULAR | Status: DC
  Administered 2021-04-20: 100 [IU] via INTRAMUSCULAR

## 2021-04-20 MED ORDER — INCOBOTULINUMTOXINA 50 UNITS IM SOLR
50.0000 [IU] | INTRAMUSCULAR | Status: DC
  Administered 2021-04-20: 50 [IU] via INTRAMUSCULAR

## 2021-04-20 NOTE — Progress Notes (Signed)
xeomin- 100 units x 1vial Lot: [id number] Expiration: 10-2022 NDC: [id number]   Xeomin- 50 units [id number] Expiration: 02-2022 NDC: [id number]   B/B

## 2021-04-20 NOTE — Progress Notes (Signed)
Chief Complaint  Patient presents with   Procedure   Injections    Xeomin injection New Room: Husband Linda Hayes in room    HISTORICAL  SAMARY Linda Hayes is a 70 year old female, seen in request by primary care physician Dr. 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  I reviewed and summarized the referring note.  Past medical history Depression anxiety, taking Zoloft 100 mg, Xanax 2 mg 3 times a day, Ambien, Hypothyroidism, on supplement,  She began to notice forceful bilateral eye blinking around 2008, after she experienced excessive stress, symptoms gradually getting worse, over the years, she was seen by different neurologist, ophthalmologist, received Botox injection  Was seen by [place] neurologist Dr. Tyron Russell before 2013, will switch to [facility] ophthalmologist Dr.Woodward since Jan 2014, reviewed the note, severe blepharospasm with Meige's syndrome, and Breyghel syndrome, failed oral medication Xanax, clonazepam, Ritalin, has tried Botox, Dysport, Xeomin in the past, reported as result with Dysport, has to quit driving since [id number] due to severe blepharospasm, trial of Botox injection on October 07 2012, without any effect noticed, repeat injection of 70 units of Botox on Jan 08, 2013, patient reported benefit only last about 3 weeks, injection of June 13, 2013, 200 units of Dysport, repeat injection October 2015, was referred to neurosurgery for consideration of deep brain stimulation in the 2016, repeat Botox 100 units, last injection by Dr. Heide Spark was on March 02, 2016, this for 300 units,  She started seeing [place] neurologist Dr. Wells Guiles Tat since March 2018, short trial of Botox a injection did not provide significant improvement, there was consideration of botulism toxin type B, myobloc vs deep brain stimulation referral, but was not carried through  She has not received any toxin injection  since March 2018, complains of significant bilateral blepharospasm, blocking her vision, no longer driving, gait abnormality because of her visual difficulty, she also has facial muscle twitching, abnormal neck movement, currently on Xanax 2 mg 3 times a day from her primary care physician Dr.Plotnikov, Linda Hayes   UPDATE Jun 03 2020: She received Xeomin injection on May 06, 2020, reported moderate improvement  100 units of Xeomin dissolving to 2 cc of normal saline, under EMG guidance  Bilateral orbicularis oculi multiple injection sites 2.5 units each sides, at 2, 3, 4, 5, 6, 7, 8, 9, 10 o'clock position (2.5 units x9=22.5 units each side).  Bilateral corrugate 5 unites Prosource 5 units  Right levator scapula 20 Left levator scapula 20 units  She denies side effect, the injection to levator scapula has helped her head titubation as well  UPDATE Sep 28 2020: She did well with previous injection, it also helped her head titubation, but noticed wearing off after 6 to 8 weeks  UPDATE Sept 7 2022: She missed her previous appointment because her daughter was ill, now noticed increased forceful eye closure, head titubation, will use Xeomin 150 units today, previously tried Botox, Dysport also, today she is questioning whether Dysport would work better, from reviewing previous record, there was recorded suboptimal response with Dysport injection in the past, I have advised her to call back in 1 month, if she desires, we may try Dysport again 300 units  REVIEW OF SYSTEMS: Full 14 system review of systems performed and notable only for as above All other review of systems were negative.  ALLERGIES: Allergies  Allergen Reactions   Ciprofloxacin     Caused colitis   Erythromycin  Intolerance - GI   Gabapentin     cough   Hydrocodone Nausea And Vomiting   Penicillins     rash   Tramadol     n/v   Ritalin  [Methylphenidate] Palpitations    HOME MEDICATIONS: Current Outpatient  Medications  Medication Sig Dispense Refill   alprazolam (XANAX) 2 MG tablet Take 1 tablet (2 mg total) by mouth 3 (three) times daily as needed for anxiety. 270 tablet 1   Cholecalciferol (VITAMIN D3) 50 MCG (2000 UT) capsule Take 1 capsule (2,000 Units total) by mouth daily. 100 capsule 3   sertraline (ZOLOFT) 100 MG tablet TAKE 2 TABLETS BY MOUTH EVERY DAY 180 tablet 3   SYNTHROID 125 MCG tablet TAKE 1 TABLET BY MOUTH EVERY DAY 90 tablet 3   zolpidem (AMBIEN CR) 6.25 MG CR tablet 1 po qhs prn 90 tablet 1   No current facility-administered medications for this visit.    PAST MEDICAL HISTORY: Past Medical History:  Diagnosis Date   ANXIETY 03/17/2008   ASTHMATIC BRONCHITIS, ACUTE 10/01/2008   Blepharospasm 06/11/2009   Cancer ([place])    thyroid cancer    Cataract    forming both eyes    Colitis due to Clostridioides difficile 2007   DEPRESSION 03/17/2008   GAIT ATAXIA 06/11/2009   GERD (gastroesophageal reflux disease)    diet related    Hyperlipidemia    HYPOTHYROIDISM 03/17/2008   INSOMNIA, PERSISTENT 03/17/2008   Meige syndrome (blepharospasm with oromandibular dystonia)    OBESITY 03/17/2008   Palpitations 10/01/2008   SWEATING 03/17/2008    PAST SURGICAL HISTORY: Past Surgical History:  Procedure Laterality Date   COLONOSCOPY     THYROIDECTOMY  1999   TONSILLECTOMY      FAMILY HISTORY: Family History  Problem Relation Age of Onset   Other Mother        injury related to a fall   Prostate cancer Father    Colon polyps Sister    Hypertension Other    Depression Other    Diabetes Other    Colon cancer Neg Hx    Esophageal cancer Neg Hx    Stomach cancer Neg Hx    Liver disease Neg Hx    Pancreatic cancer Neg Hx    Ulcerative colitis Neg Hx     SOCIAL HISTORY: Social History   Socioeconomic History   Marital status: Married    Spouse name: Not on file   Number of children: 3   Years of education: 12   Highest education level: High school graduate  Occupational  History   Occupation: Disabled  Tobacco Use   Smoking status: Never   Smokeless tobacco: Never  Substance and Sexual Activity   Alcohol use: Not Currently    Alcohol/week: 0.0 standard drinks   Drug use: No   Sexual activity: Yes  Other Topics Concern   Not on file  Social History Narrative   Married, 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   Right-handed.    No daily use of caffeine.      Social Determinants of Health   Financial Resource Strain: Not on file  Food Insecurity: Not on file  Transportation Needs: Not on file  Physical Activity: Not on file  Stress: Not on file  Social Connections: Not on file  Intimate Partner Violence: Not on file     PHYSICAL EXAM   Vitals:   04/20/21 1259  BP: 121/78  Pulse: 90  Weight: 187 lb (84.8 kg)  Height: '5\' 4"'$  (1.626 m)   Not recorded     Body mass index is 32.1 kg/m.  PHYSICAL EXAMNIATION:  Frequent forceful eye blinking, to the point of blocking vision, also lower face grimace movement,  head rotatory titubation  ASSESSMENT AND PLAN  Linda Hayes is a 70 y.o. female   Severe blepharospasm, meige syndrome  Previously only short benefit from Botox a, Dysport injection,   Responding well to EMG guided Xeomin injection on Sept 23 2021.  I personally reviewed MRI of the brain in September 2021, generalized atrophy, no acute abnormalities.  Will continue injection in 3 months,     EMG guided Xeomin injection, uses Xeomin 150 units  Bilateral orbicularis oculi multiple injection sites 2.5 units each sides, at 2, 3, 4, 5, 6, 7, 8, 9, 10 o'clock position (2.5 units x9=22.5 units each side).  Bilateral corrugate 5 unites Prosource 5 units  Right splenius cervix 20 units Left splenius cervix 20 units  Right splenius cervix 25 units Left splenius cervix 25 units  Will return to clinic in 3 months for repeat injection, she will call back in 1 month for progress report,  if she worries about the efficacy of xeomin, may consider Dysport 300 units    Marcial Pacas, M.D. Ph.D.  [facility] [place], [place] [address] Phone: [phone] Fax:      [phone]    CC:  Cassandria Anger, MD [address] [place],  [place] [address]      Chief Complaint  Patient presents with   Procedure   Injections    Xeomin injection New Room: Husband Linda Hayes in room    HISTORICAL  Linda Hayes is a 70 year old female, seen in request by primary care physician Dr. 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  I reviewed and summarized the referring note.  Past medical history Depression anxiety, taking Zoloft 100 mg, Xanax 2 mg 3 times a day, Ambien, Hypothyroidism, on supplement,  She began to notice forceful bilateral eye blinking around 2008, after she experienced excessive stress, symptoms gradually getting worse, over the years, she was seen by different neurologist, ophthalmologist, received Botox injection  Was seen by [facility] neurolog[facility] Dr. Tyron Russell before 2013, will switch to [facility] ophthalmologist Dr.Woodward since Jan 2014, reviewed the note, severe blepharospasm with Meige's syndrome, and Breyghel syndrome, failed oral medication Xanax, clonazepam, Ritalin, has tried Botox, Dysport, Xeomin in the past, reported as result with Dysport, has to quit driving since [id number] due to severe blepharospasm, trial of Botox injection on October 07 2012, without any effect noticed, repeat injection of 70 units of Botox on Jan 08, 2013, patient reported benefit only last about 3 weeks, injection of June 13, 2013, 200 units of Dysport, repeat injection October 2015, was referred to neurosurgery for consideration of deep brain stimulation in the 2016, repeat Botox 100 units, last injection by Dr. Heide Spark was on March 02, 2016, this for 300 units,  She  started seeing [place] neurologist Dr. Wells Guiles Tat since March 2018, short trial of Botox a injection did not provide significant improvement, there was consideration of botulism toxin type B, myobloc vs deep brain stimulation referral, but was not carried through  She has not received any toxin injection since March 2018, complains of significant bilateral blepharospasm, blocking her vision, no longer driving, gait abnormality because of her visual difficulty, she also has  facial muscle twitching, abnormal neck movement, currently on Xanax 2 mg 3 times a day from her primary care physician Dr.Plotnikov, Jilda Panda Jun 03 2020: She received Xeomin injection on May 06, 2020, reported moderate improvement  100 units of Xeomin dissolving to 2 cc of normal saline, under EMG guidance  Bilateral orbicularis oculi multiple injection sites 2.5 units each sides, at 2, 3, 4, 5, 6, 7, 8, 9, 10 o'clock position (2.5 units x9=22.5 units each side).  Bilateral corrugate 5 unites Prosource 5 units  Right levator scapula 20 Left levator scapula 20 units  She denies side effect, the injection to levator scapula has helped her head titubation as well  UPDATE Sep 28 2020: She did well with previous injection, it also helped her head titubation, but noticed wearing off after 6 to 8 weeks   REVIEW OF SYSTEMS: Full 14 system review of systems performed and notable only for as above All other review of systems were negative.  ALLERGIES: Allergies  Allergen Reactions   Ciprofloxacin     Caused colitis   Erythromycin     Intolerance - GI   Gabapentin     cough   Hydrocodone Nausea And Vomiting   Penicillins     rash   Tramadol     n/v   Ritalin  [Methylphenidate] Palpitations    HOME MEDICATIONS: Current Outpatient Medications  Medication Sig Dispense Refill   alprazolam (XANAX) 2 MG tablet Take 1 tablet (2 mg total) by mouth 3 (three) times daily as needed for anxiety. 270 tablet 1    Cholecalciferol (VITAMIN D3) 50 MCG (2000 UT) capsule Take 1 capsule (2,000 Units total) by mouth daily. 100 capsule 3   sertraline (ZOLOFT) 100 MG tablet TAKE 2 TABLETS BY MOUTH EVERY DAY 180 tablet 3   SYNTHROID 125 MCG tablet TAKE 1 TABLET BY MOUTH EVERY DAY 90 tablet 3   zolpidem (AMBIEN CR) 6.25 MG CR tablet 1 po qhs prn 90 tablet 1   No current facility-administered medications for this visit.    PAST MEDICAL HISTORY: Past Medical History:  Diagnosis Date   ANXIETY 03/17/2008   ASTHMATIC BRONCHITIS, ACUTE 10/01/2008   Blepharospasm 06/11/2009   Cancer ([place])    thyroid cancer    Cataract    forming both eyes    Colitis due to Clostridioides difficile 2007   DEPRESSION 03/17/2008   GAIT ATAXIA 06/11/2009   GERD (gastroesophageal reflux disease)    diet related    Hyperlipidemia    HYPOTHYROIDISM 03/17/2008   INSOMNIA, PERSISTENT 03/17/2008   Meige syndrome (blepharospasm with oromandibular dystonia)    OBESITY 03/17/2008   Palpitations 10/01/2008   SWEATING 03/17/2008    PAST SURGICAL HISTORY: Past Surgical History:  Procedure Laterality Date   COLONOSCOPY     THYROIDECTOMY  1999   TONSILLECTOMY      FAMILY HISTORY: Family History  Problem Relation Age of Onset   Other Mother        injury related to a fall   Prostate cancer Father    Colon polyps Sister    Hypertension Other    Depression Other    Diabetes Other    Colon cancer Neg Hx    Esophageal cancer Neg Hx    Stomach cancer Neg Hx    Liver disease Neg Hx    Pancreatic cancer Neg Hx    Ulcerative colitis Neg Hx     SOCIAL HISTORY: Social History   Socioeconomic History   Marital status:  Married    Spouse name: Not on file   Number of children: 3   Years of education: 12   Highest education level: High school graduate  Occupational History   Occupation: Disabled  Tobacco Use   Smoking status: Never   Smokeless tobacco: Never  Substance and Sexual Activity   Alcohol use: Not Currently     Alcohol/week: 0.0 standard drinks   Drug use: No   Sexual activity: Yes  Other Topics Concern   Not on file  Social History Narrative   Married, 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   Right-handed.    No daily use of caffeine.      Social Determinants of Health   Financial Resource Strain: Not on file  Food Insecurity: Not on file  Transportation Needs: Not on file  Physical Activity: Not on file  Stress: Not on file  Social Connections: Not on file  Intimate Partner Violence: Not on file     PHYSICAL EXAM   Vitals:   04/20/21 1259  BP: 121/78  Pulse: 90  Weight: 187 lb (84.8 kg)  Height: '5\' 4"'$  (1.626 m)   Not recorded     Body mass index is 32.1 kg/m.  PHYSICAL EXAMNIATION:  Flattening of bilateral corrugate, procereus, apparently xeomin injection is effective, still has frequent eye blinking, extracting to lower face muscles  ASSESSMENT AND PLAN  Linda Hayes is a 70 y.o. female   Severe blepharospasm, meige syndrome  Previously only short benefit from Botox a, Dysport injection, not sure she has tried xeomin before,  Preauthorization repeat botulism toxin injection,  Responding well to EMG guided Xeomin injection on Sept 23 2021, there is apparent skin slough soft at the injection site, indicating that Xeomin is effective  I personally reviewed MRI of the brain in September 2021, generalized atrophy, no acute abnormalities.  Will continue injection in 3 months,  Try Artane 2 mg 3 times a day  EMG guided Xeomin injection, uses Xeomin 150 units  Bilateral orbicularis oculi multiple injection sites 2.5 units each sides, at 2, 3, 4, 5, 6, 7, 8, 9, 10 o'clock position (2.5 units x9=22.5 units each side).  Bilateral corrugate 5 unites Prosource 5 units  Right levator scapula 20 Left levator scapula 20 units  Right splenius cervix 25 units Left splenius cervix 25 units

## 2021-05-17 NOTE — Telephone Encounter (Signed)
Duplicate msg.. sent pt wife response on previous mychart msg. Pt need ov for evaluation.Marland KitchenJohny Chess

## 2021-07-18 ENCOUNTER — Other Ambulatory Visit: Payer: Self-pay | Admitting: Internal Medicine

## 2021-07-20 ENCOUNTER — Ambulatory Visit: Payer: Medicare Other | Admitting: Neurology

## 2021-07-21 DIAGNOSIS — Z23 Encounter for immunization: Secondary | ICD-10-CM | POA: Diagnosis not present

## 2021-08-02 ENCOUNTER — Telehealth (INDEPENDENT_AMBULATORY_CARE_PROVIDER_SITE_OTHER): Payer: Medicare Other | Admitting: Internal Medicine

## 2021-08-02 ENCOUNTER — Other Ambulatory Visit: Payer: Self-pay

## 2021-08-02 ENCOUNTER — Encounter: Payer: Self-pay | Admitting: Internal Medicine

## 2021-08-02 DIAGNOSIS — F419 Anxiety disorder, unspecified: Secondary | ICD-10-CM

## 2021-08-02 DIAGNOSIS — F5104 Psychophysiologic insomnia: Secondary | ICD-10-CM

## 2021-08-02 NOTE — Assessment & Plan Note (Signed)
Continue on alprazolam -   2 mg 3 times daily. Zoloft  200 mg daily.  Potential benefits of a long term benzodiazepines  use as well as potential risks  and complications were explained to the patient and were aknowledged.

## 2021-08-02 NOTE — Assessment & Plan Note (Signed)
Cont w/Zolpidem at hs  Potential benefits of a long term benzodiazepines  use as well as potential risks  and complications were explained to the patient and were aknowledged. 

## 2021-08-02 NOTE — Progress Notes (Signed)
Virtual Visit via Telephone Note  I connected with Linda Hayes on 09/07/20 at  9:30 AM EST by telephone and verified that I am speaking with the correct person using two identifiers.  Location:  Persons participating in the virtual visit: patient, provider Patient: home Provider: [place] Office   I discussed the limitations, risks, security and privacy concerns of performing an evaluation and management service by telephone and the availability of in person appointments. I also discussed with the patient that there may be a patient responsible charge related to this service. The patient expressed understanding and agreed to proceed.  No chief complaint on file.    History of Present Illness: Our virtual call has failed.  We need to follow-up on anxiety and insomnia.  The problems are stable.  We need to renew Linzess prescriptions   ROS  Insomnia, anxiety and tremor are unchanged Observations/Objective: The patient sounds normal on the phone  Assessment and Plan:  Problem List Items Addressed This Visit     Anxiety disorder     Continue on alprazolam -   2 mg 3 times daily. Zoloft  200 mg daily.  Potential benefits of a long term benzodiazepines  use as well as potential risks  and complications were explained to the patient and were aknowledged.       Insomnia disorder    Cont w/Zolpidem at hs  Potential benefits of a long term benzodiazepines  use as well as potential risks  and complications were explained to the patient and were aknowledged.        No orders of the defined types were placed in this encounter.     Follow Up Instructions:    I discussed the assessment and treatment plan with the patient. The patient was provided an opportunity to ask questions and all were answered. The patient agreed with the plan and demonstrated an understanding of the instructions.   The patient was advised to call back or seek an in-person evaluation if the symptoms worsen or  if the condition fails to improve as anticipated.  I provided 11 minutes of non-face-to-face time during this encounter.   Walker Kehr, MD

## 2021-08-06 ENCOUNTER — Other Ambulatory Visit: Payer: Self-pay | Admitting: Internal Medicine

## 2021-10-19 ENCOUNTER — Ambulatory Visit: Payer: Medicare Other | Admitting: Neurology

## 2021-10-21 IMAGING — CT CT ABD-PELV W/ CM
2 of 5 series · 15 of 46 positions shown, 17 images · IV contrast (APPLIED)
Comparison: No priors.

CLINICAL DATA: 68-year-old female with history of left lower
quadrant abdominal pain and tenderness. Change in bowel habits.

EXAM:
CT ABDOMEN AND PELVIS WITH CONTRAST
TECHNIQUE: Multidetector CT imaging of the abdomen and pelvis was performed
using the standard protocol following bolus administration of
intravenous contrast.
CONTRAST:  100mL OMNIPAQUE IOHEXOL 300 MG/ML  SOLN

[Series 2: axial st · axial · 0.78mm/px · z∈[-411,-21]mm · 12 of 91 slices shown, 14 images]
[im 7/91  soft-tissue]
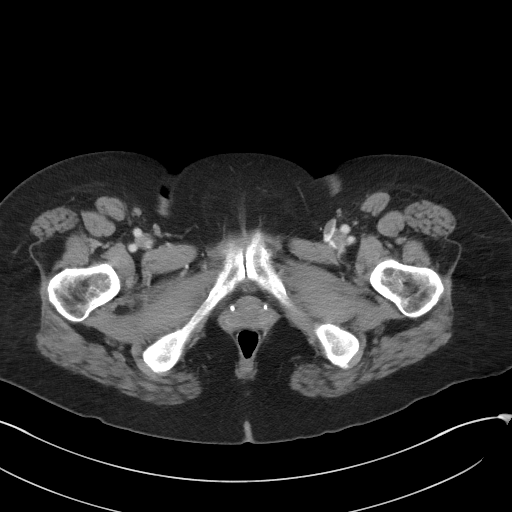
[im 7/91  bone]
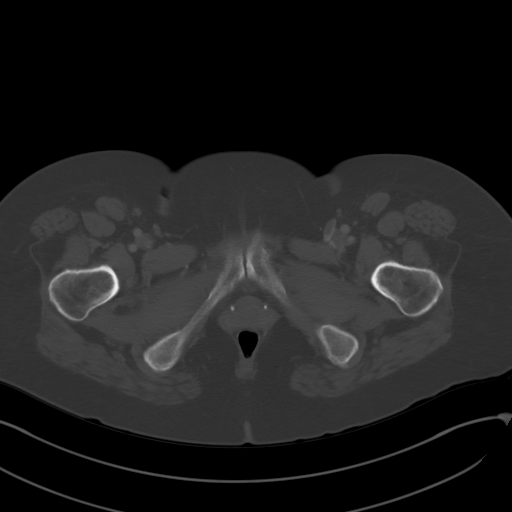
[im 13/91  soft-tissue]
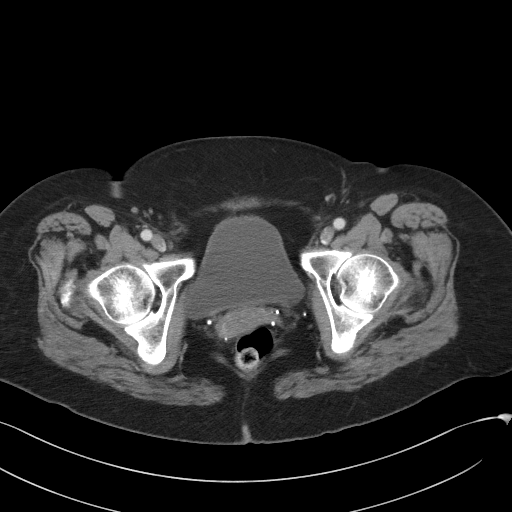
[im 19/91  soft-tissue]
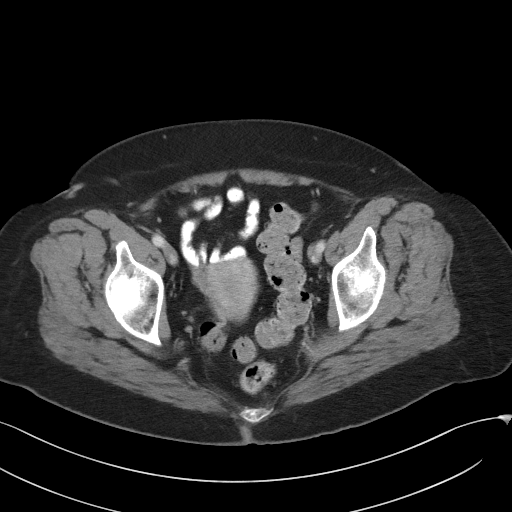
[im 31/91  soft-tissue]
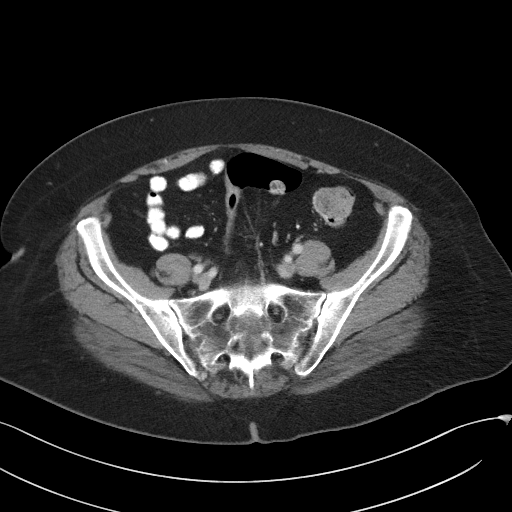
[im 37/91  soft-tissue]
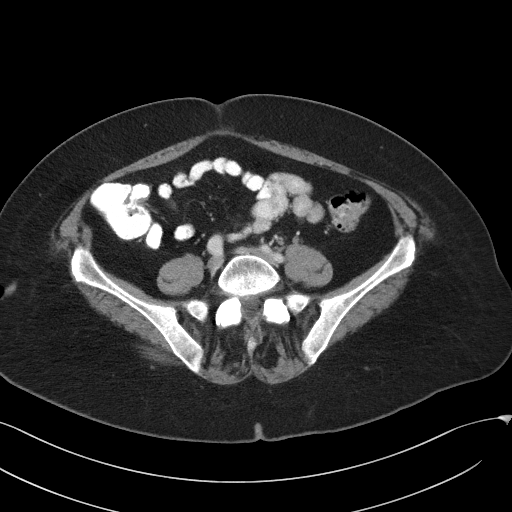
[im 43/91  soft-tissue]
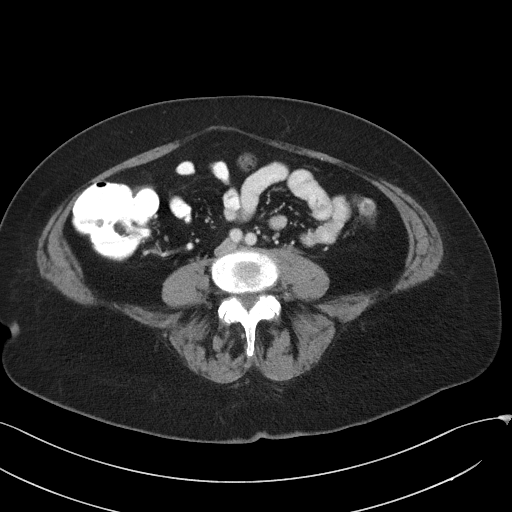
[im 49/91  soft-tissue]
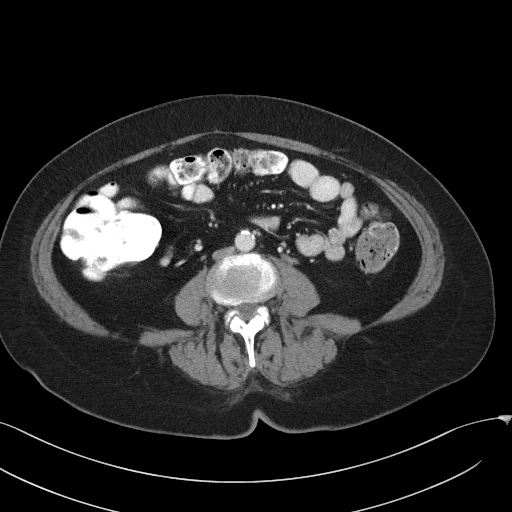
[im 55/91  soft-tissue]
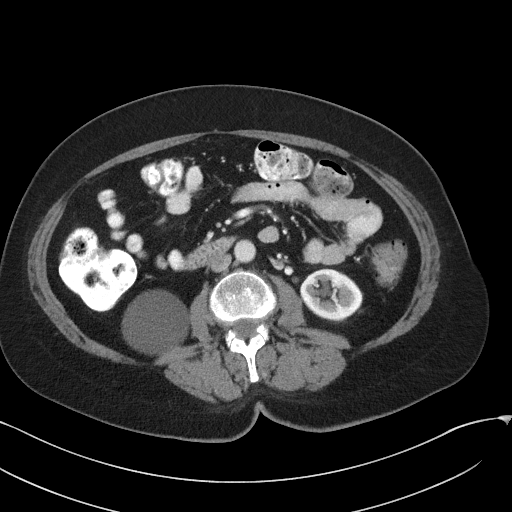
[im 61/91  soft-tissue]
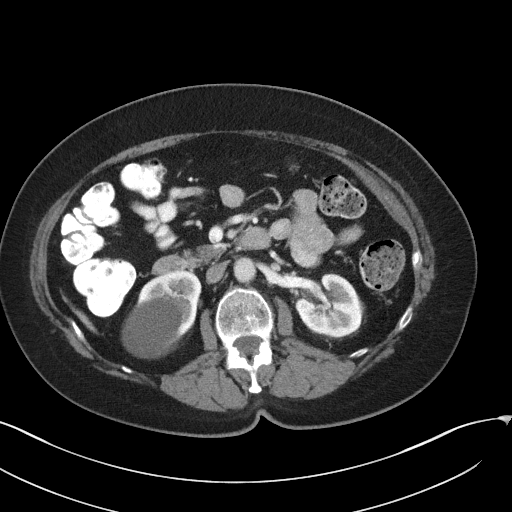
[im 61/91  bone]
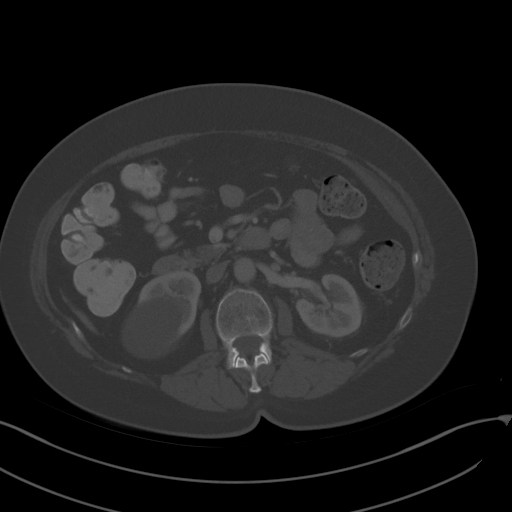
[im 73/91  soft-tissue]
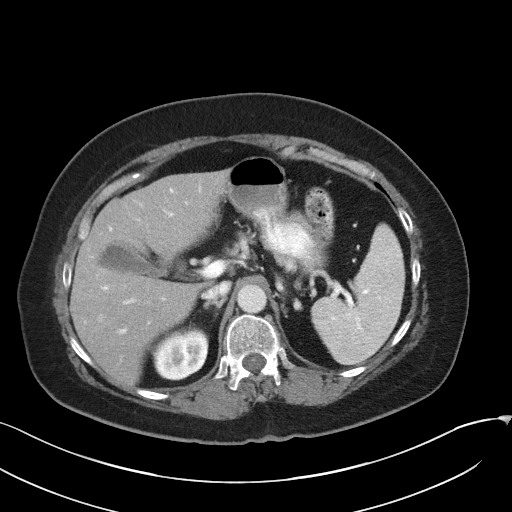
[im 79/91  soft-tissue]
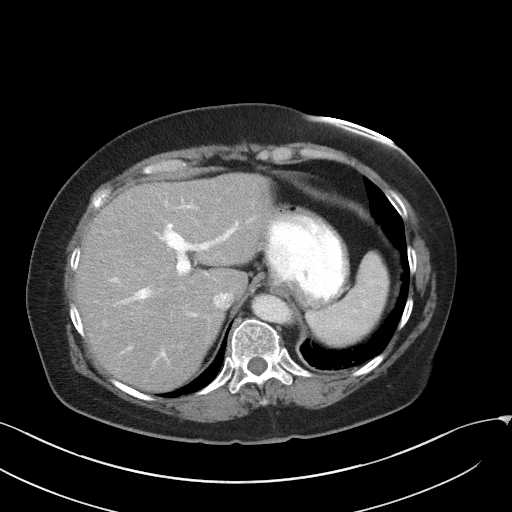
[im 85/91  soft-tissue]
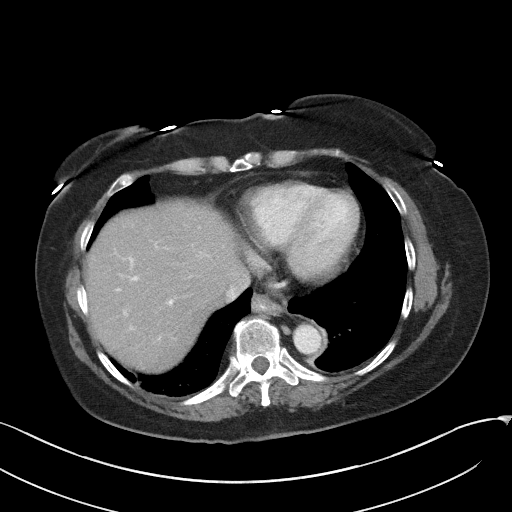

[Series 5: coronal st · coronal · 0.78mm/px · 3 of 92 slices shown]
[im 31/92  soft-tissue]
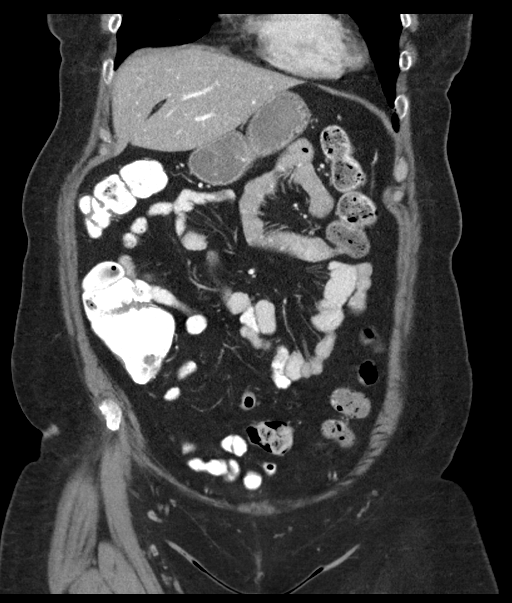
[im 41/92  soft-tissue]
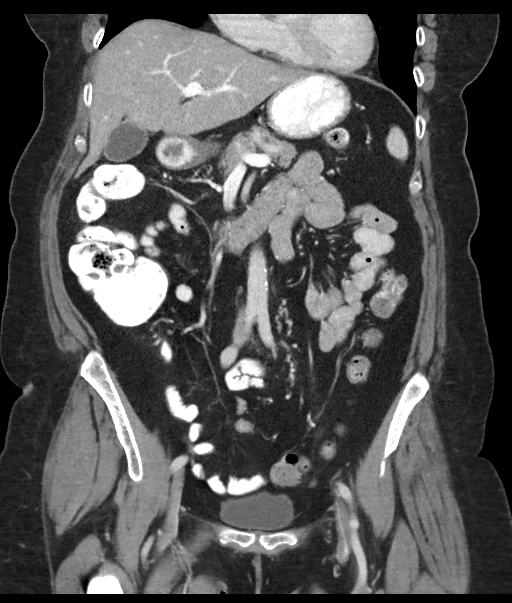
[im 51/92  soft-tissue]
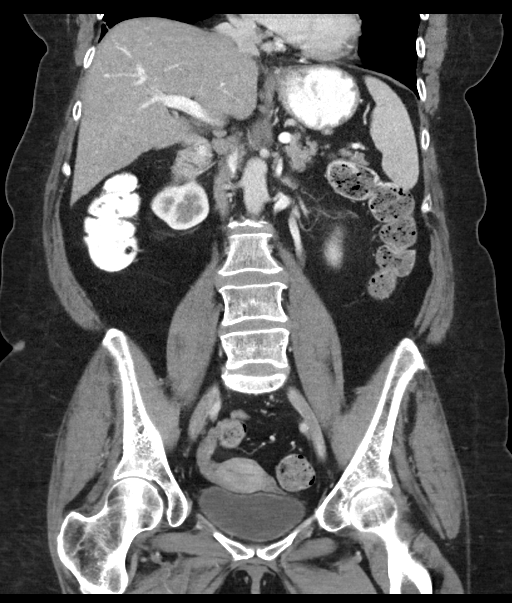

[15 of 46 positions shown; findings below may reference images not displayed]

FINDINGS: Lower chest: In the medial aspect of the lower right hemithorax
there is a well-circumscribed 3.3 x 2.8 cm low-attenuation lesion
associated with the right-side of the vertebral column at the level
of T10, likely to represent a perineural cyst, minimally increased
in size compared to remote prior study from 5551. There is a smaller
lesion on the left measuring 1.7 x 1.0 cm, which is unchanged
compared to 5551, also likely to represent a small perineural cyst.

Hepatobiliary: Mild diffuse low attenuation throughout the hepatic
parenchyma, suggestive of hepatic steatosis (difficult to say for
certain on today's contrast enhanced examination). Subcentimeter
low-attenuation lesion in segment 7 of the liver (axial image 10 of
series 2), too small to characterize, but statistically likely to
represent a tiny cyst. No other larger more suspicious cystic or
solid hepatic lesions. No intra or extrahepatic biliary ductal
dilatation. Gallbladder is normal in appearance.

Pancreas: No pancreatic mass. No pancreatic ductal dilatation. No
pancreatic or peripancreatic fluid collections or inflammatory
changes.

Spleen: Unremarkable.

Adrenals/Urinary Tract: 5.9 x 5.6 cm low-attenuation lesion in the
lower pole of the right kidney, compatible with a simple cyst. Left
kidney and bilateral adrenal glands are normal in appearance. No
hydroureteronephrosis. Urinary bladder is normal in appearance.

Stomach/Bowel: Normal appearance of the stomach. No pathologic
dilatation of small bowel or colon. The appendix is not confidently
identified and may be surgically absent. Regardless, there are no
inflammatory changes noted adjacent to the cecum to suggest the
presence of an acute appendicitis at this time.

Vascular/Lymphatic: Aortic atherosclerosis, without evidence of
aneurysm or dissection in the abdominal or pelvic vasculature. No
lymphadenopathy noted in the abdomen or pelvis.

Reproductive: Uterus and ovaries are unremarkable in appearance.

Other: No significant volume of ascites.  No pneumoperitoneum.

Musculoskeletal: There are no aggressive appearing lytic or blastic
lesions noted in the visualized portions of the skeleton.
IMPRESSION: 1. No acute findings are noted in the abdomen or pelvis to account
for the patient's symptoms.
2. Aortic atherosclerosis.
3. Hepatic steatosis.
4. Additional incidental findings, as above.

## 2021-12-27 ENCOUNTER — Encounter: Payer: Self-pay | Admitting: Internal Medicine

## 2022-01-03 ENCOUNTER — Encounter: Payer: Self-pay | Admitting: Internal Medicine

## 2022-01-12 ENCOUNTER — Other Ambulatory Visit: Payer: Self-pay | Admitting: Internal Medicine

## 2022-01-14 ENCOUNTER — Other Ambulatory Visit: Payer: Self-pay | Admitting: Internal Medicine

## 2022-01-16 NOTE — Telephone Encounter (Signed)
Check [place] registry last filled 10/16/2021...Johny Chess

## 2022-01-17 ENCOUNTER — Other Ambulatory Visit: Payer: Self-pay | Admitting: Internal Medicine

## 2022-01-17 ENCOUNTER — Encounter: Payer: Self-pay | Admitting: Internal Medicine

## 2022-01-17 NOTE — Telephone Encounter (Signed)
Check [place] registry last filled 10/16/2021.Marland KitchenJohny Hayes

## 2022-01-18 ENCOUNTER — Encounter: Payer: Self-pay | Admitting: Internal Medicine

## 2022-01-18 ENCOUNTER — Other Ambulatory Visit: Payer: Self-pay | Admitting: Internal Medicine

## 2022-01-18 NOTE — Telephone Encounter (Signed)
Check [place] registry last filled 10/16/2021.Marland KitchenJohny Chess

## 2022-01-19 ENCOUNTER — Other Ambulatory Visit: Payer: Self-pay | Admitting: Internal Medicine

## 2022-01-19 MED ORDER — ALPRAZOLAM 2 MG PO TABS: 2.0000 mg | ORAL_TABLET | Freq: Three times a day (TID) | ORAL | 0 refills | Status: DC | PRN

## 2022-01-26 ENCOUNTER — Encounter: Payer: Self-pay | Admitting: Internal Medicine

## 2022-01-26 NOTE — Progress Notes (Signed)
Subjective:    Patient ID: Linda Hayes, female    DOB: 07/13/1951, 71 y.o.   MRN: [id number]      HPI Linda Hayes is here for  Chief Complaint  Patient presents with   Urinary Tract Infection    ? UTI:  Her symptoms started  3-4 weeks ago.  Two weeks ago it got worse.  She has LLQ pain, bladder pressure, feeling of her bladder falling out.  Her symptoms are worse after she eats.  She does have some urinary frequency, urinary urgency.  She denies any fevers, blood in the urine, cloudy urine or nausea.   She has been using Azo.  She drinks cranberry juice daily.    She states she has a history of UTi's.  She tries to treat a lot of these at home.    Medications and allergies reviewed with patient and updated if appropriate.  Current Outpatient Medications on File Prior to Visit  Medication Sig Dispense Refill   alprazolam (XANAX) 2 MG tablet Take 1 tablet (2 mg total) by mouth 3 (three) times daily as needed for anxiety. 270 tablet 0   Cholecalciferol (VITAMIN D3) 50 MCG (2000 UT) capsule Take 1 capsule (2,000 Units total) by mouth daily. 100 capsule 3   sertraline (ZOLOFT) 100 MG tablet TAKE 2 TABLETS BY MOUTH EVERY DAY 180 tablet 0   SYNTHROID 125 MCG tablet TAKE 1 TABLET BY MOUTH EVERY DAY 90 tablet 3   zolpidem (AMBIEN CR) 6.25 MG CR tablet TAKE 1 TABLET BY MOUTH EVERY DAY AT BEDTIME AS NEEDED 90 tablet 0   Current Facility-Administered Medications on File Prior to Visit  Medication Dose Route Frequency Provider Last Rate Last Admin   incobotulinumtoxinA (XEOMIN) 100 units injection 100 Units  100 Units Intramuscular Q90 days Marcial Pacas, MD   100 Units at 04/20/21 1355   incobotulinumtoxinA (XEOMIN) 50 units injection 50 Units  50 Units Intramuscular Q90 days Marcial Pacas, MD   50 Units at 04/20/21 1354    Review of Systems  Constitutional:  Negative for chills and fever.  Respiratory:  Positive for shortness of breath.   Genitourinary:  Positive for frequency and  urgency. Negative for dysuria and hematuria.       No cloudy urine  Musculoskeletal:  Negative for back pain.       Objective:   Vitals:   01/27/22 0822  BP: 126/80  Pulse: 80  Temp: 98.2 F (36.8 C)  SpO2: 96%   BP Readings from Last 3 Encounters:  01/27/22 126/80  04/20/21 121/78  01/18/21 110/76   Wt Readings from Last 3 Encounters:  04/20/21 187 lb (84.8 kg)  01/18/21 188 lb 3.2 oz (85.4 kg)  09/28/20 187 lb 9.6 oz (85.1 kg)   Body mass index is 32.1 kg/m.    Physical Exam Constitutional:      General: She is not in acute distress.    Appearance: Normal appearance. She is not ill-appearing.  HENT:     Head: Normocephalic.  Eyes:     Conjunctiva/sclera: Conjunctivae normal.  Cardiovascular:     Rate and Rhythm: Normal rate and regular rhythm.  Pulmonary:     Effort: Pulmonary effort is normal. No respiratory distress.     Breath sounds: Normal breath sounds. No wheezing or rales.  Abdominal:     General: There is no distension.     Palpations: Abdomen is soft.     Tenderness: There is abdominal tenderness (Left lower quadrant and suprapubic  region). There is no right CVA tenderness, left CVA tenderness, guarding or rebound.  Skin:    General: Skin is warm and dry.  Neurological:     Mental Status: She is alert.            Assessment & Plan:    See Problem List for Assessment and Plan of chronic medical problems.

## 2022-01-27 ENCOUNTER — Ambulatory Visit (INDEPENDENT_AMBULATORY_CARE_PROVIDER_SITE_OTHER): Payer: Medicare Other | Admitting: Internal Medicine

## 2022-01-27 VITALS — BP 126/80 | HR 80 | Temp 98.2°F | Ht 64.0 in

## 2022-01-27 DIAGNOSIS — N3000 Acute cystitis without hematuria: Secondary | ICD-10-CM | POA: Diagnosis not present

## 2022-01-27 DIAGNOSIS — R300 Dysuria: Secondary | ICD-10-CM

## 2022-01-27 LAB — POC URINALSYSI DIPSTICK (AUTOMATED)
Bilirubin, UA: NEGATIVE
Blood, UA: NEGATIVE
Glucose, UA: NEGATIVE
Ketones, UA: NEGATIVE
Leukocytes, UA: NEGATIVE
Nitrite, UA: NEGATIVE
Protein, UA: NEGATIVE
Spec Grav, UA: 1.02 (ref 1.010–1.025)
Urobilinogen, UA: 0.2 E.U./dL
pH, UA: 6 (ref 5.0–8.0)

## 2022-01-27 MED ORDER — CEPHALEXIN 500 MG PO CAPS: 500.0000 mg | ORAL_CAPSULE | Freq: Two times a day (BID) | ORAL | 0 refills | Status: DC

## 2022-01-27 NOTE — Patient Instructions (Signed)
Take the antibiotic as prescribed.  Take tylenol if needed.     Increase your water intake.   Call if no improvement     Urinary Tract Infection, Adult A urinary tract infection (UTI) is an infection of any part of the urinary tract, which includes the kidneys, ureters, bladder, and urethra. These organs make, store, and get rid of urine in the body. UTI can be a bladder infection (cystitis) or kidney infection (pyelonephritis). What are the causes? This infection may be caused by fungi, viruses, or bacteria. Bacteria are the most common cause of UTIs. This condition can also be caused by repeated incomplete emptying of the bladder during urination. What increases the risk? This condition is more likely to develop if:  You ignore your need to urinate or hold urine for long periods of time.  You do not empty your bladder completely during urination.  You wipe back to front after urinating or having a bowel movement, if you are female.  You are uncircumcised, if you are female.  You are constipated.  You have a urinary catheter that stays in place (indwelling).  You have a weak defense (immune) system.  You have a medical condition that affects your bowels, kidneys, or bladder.  You have diabetes.  You take antibiotic medicines frequently or for long periods of time, and the antibiotics no longer work well against certain types of infections (antibiotic resistance).  You take medicines that irritate your urinary tract.  You are exposed to chemicals that irritate your urinary tract.  You are female.  What are the signs or symptoms? Symptoms of this condition include:  Fever.  Frequent urination or passing small amounts of urine frequently.  Needing to urinate urgently.  Pain or burning with urination.  Urine that smells bad or unusual.  Cloudy urine.  Pain in the lower abdomen or back.  Trouble urinating.  Blood in the urine.  Vomiting or being less hungry than  normal.  Diarrhea or abdominal pain.  Vaginal discharge, if you are female.  How is this diagnosed? This condition is diagnosed with a medical history and physical exam. You will also need to provide a urine sample to test your urine. Other tests may be done, including:  Blood tests.  Sexually transmitted disease (STD) testing.  If you have had more than one UTI, a cystoscopy or imaging studies may be done to determine the cause of the infections. How is this treated? Treatment for this condition often includes a combination of two or more of the following:  Antibiotic medicine.  Other medicines to treat less common causes of UTI.  Over-the-counter medicines to treat pain.  Drinking enough water to stay hydrated.  Follow these instructions at home:  Take over-the-counter and prescription medicines only as told by your health care provider.  If you were prescribed an antibiotic, take it as told by your health care provider. Do not stop taking the antibiotic even if you start to feel better.  Avoid alcohol, caffeine, tea, and carbonated beverages. They can irritate your bladder.  Drink enough fluid to keep your urine clear or pale yellow.  Keep all follow-up visits as told by your health care provider. This is important.  Make sure to: ? Empty your bladder often and completely. Do not hold urine for long periods of time. ? Empty your bladder before and after sex. ? Wipe from front to back after a bowel movement if you are female. Use each tissue one time when you   wipe. Contact a health care provider if:  You have back pain.  You have a fever.  You feel nauseous or vomit.  Your symptoms do not get better after 3 days.  Your symptoms go away and then return. Get help right away if:  You have severe back pain or lower abdominal pain.  You are vomiting and cannot keep down any medicines or water. This information is not intended to replace advice given to you by  your health care provider. Make sure you discuss any questions you have with your health care provider. Document Released: 05/10/2005 Document Revised: 01/12/2016 Document Reviewed: 06/21/2015 Elsevier Interactive Patient Education  2018 [employer]   

## 2022-01-27 NOTE — Assessment & Plan Note (Signed)
Acute Urine dip not consistent with UTI, but given her symptoms and history I am concerned she still has an infection Will send urine for culture Take the antibiotic as prescribed.  Start Keflex 500 mg twice daily x7 days Take tylenol if needed.  Okay to continue Azo Increase your water intake.  Call if no improvement

## 2022-01-29 LAB — CULTURE, URINE COMPREHENSIVE

## 2022-01-30 MED ORDER — NITROFURANTOIN MONOHYD MACRO 100 MG PO CAPS: 100.0000 mg | ORAL_CAPSULE | Freq: Two times a day (BID) | ORAL | 0 refills | Status: DC

## 2022-01-30 NOTE — Addendum Note (Signed)
Addended by: Binnie Rail on: 01/30/2022 07:16 AM   Modules accepted: Orders

## 2022-01-31 ENCOUNTER — Ambulatory Visit: Payer: Medicare Other

## 2022-01-31 ENCOUNTER — Encounter: Payer: Self-pay | Admitting: Internal Medicine

## 2022-01-31 ENCOUNTER — Ambulatory Visit (INDEPENDENT_AMBULATORY_CARE_PROVIDER_SITE_OTHER): Payer: Medicare Other | Admitting: Internal Medicine

## 2022-01-31 VITALS — BP 130/82 | HR 71 | Temp 97.9°F | Ht 64.0 in | Wt 193.0 lb

## 2022-01-31 DIAGNOSIS — N951 Menopausal and female climacteric states: Secondary | ICD-10-CM | POA: Insufficient documentation

## 2022-01-31 DIAGNOSIS — H02839 Dermatochalasis of unspecified eye, unspecified eyelid: Secondary | ICD-10-CM | POA: Insufficient documentation

## 2022-01-31 DIAGNOSIS — N390 Urinary tract infection, site not specified: Secondary | ICD-10-CM

## 2022-01-31 DIAGNOSIS — Z85850 Personal history of malignant neoplasm of thyroid: Secondary | ICD-10-CM | POA: Insufficient documentation

## 2022-01-31 DIAGNOSIS — G245 Blepharospasm: Secondary | ICD-10-CM

## 2022-01-31 DIAGNOSIS — E890 Postprocedural hypothyroidism: Secondary | ICD-10-CM | POA: Diagnosis not present

## 2022-01-31 DIAGNOSIS — H04129 Dry eye syndrome of unspecified lacrimal gland: Secondary | ICD-10-CM | POA: Insufficient documentation

## 2022-01-31 DIAGNOSIS — F5104 Psychophysiologic insomnia: Secondary | ICD-10-CM

## 2022-01-31 DIAGNOSIS — H524 Presbyopia: Secondary | ICD-10-CM | POA: Insufficient documentation

## 2022-01-31 DIAGNOSIS — R269 Unspecified abnormalities of gait and mobility: Secondary | ICD-10-CM

## 2022-01-31 DIAGNOSIS — R202 Paresthesia of skin: Secondary | ICD-10-CM

## 2022-01-31 DIAGNOSIS — G244 Idiopathic orofacial dystonia: Secondary | ICD-10-CM | POA: Insufficient documentation

## 2022-01-31 LAB — CBC WITH DIFFERENTIAL/PLATELET
Basophils Absolute: 0.1 10*3/uL (ref 0.0–0.1)
Basophils Relative: 0.6 % (ref 0.0–3.0)
Eosinophils Absolute: 0.2 10*3/uL (ref 0.0–0.7)
Eosinophils Relative: 1.9 % (ref 0.0–5.0)
HCT: 42.8 % (ref 36.0–46.0)
Hemoglobin: 14.5 g/dL (ref 12.0–15.0)
Lymphocytes Relative: 36.4 % (ref 12.0–46.0)
Lymphs Abs: 3.1 10*3/uL (ref 0.7–4.0)
MCHC: 33.9 g/dL (ref 30.0–36.0)
MCV: 95.5 fl (ref 78.0–100.0)
Monocytes Absolute: 0.5 10*3/uL (ref 0.1–1.0)
Monocytes Relative: 6.4 % (ref 3.0–12.0)
Neutro Abs: 4.6 10*3/uL (ref 1.4–7.7)
Neutrophils Relative %: 54.7 % (ref 43.0–77.0)
Platelets: 271 10*3/uL (ref 150.0–400.0)
RBC: 4.48 Mil/uL (ref 3.87–5.11)
RDW: 13.1 % (ref 11.5–15.5)
WBC: 8.4 10*3/uL (ref 4.0–10.5)

## 2022-01-31 LAB — COMPREHENSIVE METABOLIC PANEL
ALT: 16 U/L (ref 0–35)
AST: 24 U/L (ref 0–37)
Albumin: 4.4 g/dL (ref 3.5–5.2)
Alkaline Phosphatase: 51 U/L (ref 39–117)
BUN: 12 mg/dL (ref 6–23)
CO2: 25 mEq/L (ref 19–32)
Calcium: 9.6 mg/dL (ref 8.4–10.5)
Chloride: 104 mEq/L (ref 96–112)
Creatinine, Ser: 0.85 mg/dL (ref 0.40–1.20)
GFR: 69.65 mL/min (ref >60.00)
Glucose, Bld: 87 mg/dL (ref 70–99)
Potassium: 4.2 mEq/L (ref 3.5–5.1)
Sodium: 138 mEq/L (ref 135–145)
Total Bilirubin: 0.6 mg/dL (ref 0.2–1.2)
Total Protein: 7.6 g/dL (ref 6.0–8.3)

## 2022-01-31 LAB — TSH: TSH: 1.28 u[IU]/mL (ref 0.35–5.50)

## 2022-01-31 LAB — LIPID PANEL
Cholesterol: 235 mg/dL — ABNORMAL HIGH (ref 0–200)
HDL: 35.3 mg/dL — ABNORMAL LOW (ref >39.00)
NonHDL: 199.22
Total CHOL/HDL Ratio: 7
Triglycerides: 267 mg/dL — ABNORMAL HIGH (ref 0.0–149.0)
VLDL: 53.4 mg/dL — ABNORMAL HIGH (ref 0.0–40.0)

## 2022-01-31 LAB — VITAMIN B12: Vitamin B-12: 384 pg/mL (ref 211–911)

## 2022-01-31 LAB — LDL CHOLESTEROL, DIRECT: Direct LDL: 156 mg/dL

## 2022-01-31 MED ORDER — ALPRAZOLAM 2 MG PO TABS: 2.0000 mg | ORAL_TABLET | Freq: Three times a day (TID) | ORAL | 1 refills | Status: DC | PRN

## 2022-01-31 MED ORDER — ZOLPIDEM TARTRATE ER 6.25 MG PO TBCR: EXTENDED_RELEASE_TABLET | ORAL | 1 refills | Status: DC

## 2022-01-31 MED ORDER — SERTRALINE HCL 100 MG PO TABS: 200.0000 mg | ORAL_TABLET | Freq: Every day | ORAL | 1 refills | Status: DC

## 2022-01-31 NOTE — Assessment & Plan Note (Signed)
Cont w/Zolpidem at hs  Potential benefits of a long term benzodiazepines  use as well as potential risks  and complications were explained to the patient and were aknowledged. 

## 2022-01-31 NOTE — Assessment & Plan Note (Signed)
Cont w/ Levothroid 

## 2022-01-31 NOTE — Assessment & Plan Note (Signed)
Treated

## 2022-01-31 NOTE — Assessment & Plan Note (Signed)
OT, PT offered - declined Use a cane

## 2022-01-31 NOTE — Assessment & Plan Note (Signed)
On Rx 

## 2022-01-31 NOTE — Progress Notes (Signed)
Subjective:  Patient ID: Linda Hayes, female    DOB: 18-Jun-1951  Age: 71 y.o. MRN: [id number]  CC: No chief complaint on file.   HPI LEONTINA SKIDMORE presents for anxiety, insomnia, spasms Here w/dtr Lattie Haw, RN  Outpatient Medications Prior to Visit  Medication Sig Dispense Refill   cephALEXin (KEFLEX) 500 MG capsule Take 1 capsule (500 mg total) by mouth 2 (two) times daily. 14 capsule 0   Cholecalciferol (VITAMIN D3) 50 MCG (2000 UT) capsule Take 1 capsule (2,000 Units total) by mouth daily. 100 capsule 3   nitrofurantoin, macrocrystal-monohydrate, (MACROBID) 100 MG capsule Take 1 capsule (100 mg total) by mouth 2 (two) times daily. 14 capsule 0   SYNTHROID 125 MCG tablet TAKE 1 TABLET BY MOUTH EVERY DAY 90 tablet 3   alprazolam (XANAX) 2 MG tablet Take 1 tablet (2 mg total) by mouth 3 (three) times daily as needed for anxiety. 270 tablet 0   sertraline (ZOLOFT) 100 MG tablet TAKE 2 TABLETS BY MOUTH EVERY DAY 180 tablet 0   zolpidem (AMBIEN CR) 6.25 MG CR tablet TAKE 1 TABLET BY MOUTH EVERY DAY AT BEDTIME AS NEEDED 90 tablet 0   Facility-Administered Medications Prior to Visit  Medication Dose Route Frequency Provider Last Rate Last Admin   incobotulinumtoxinA (XEOMIN) 100 units injection 100 Units  100 Units Intramuscular Q90 days Marcial Pacas, MD   100 Units at 04/20/21 1355   incobotulinumtoxinA (XEOMIN) 50 units injection 50 Units  50 Units Intramuscular Q90 days Marcial Pacas, MD   50 Units at 04/20/21 1354    ROS: Review of Systems  Constitutional:  Negative for activity change, appetite change, chills, fatigue and unexpected weight change.  HENT:  Negative for congestion, mouth sores and sinus pressure.   Eyes:  Negative for visual disturbance.  Respiratory:  Negative for cough and chest tightness.   Gastrointestinal:  Negative for abdominal pain and nausea.  Genitourinary:  Negative for difficulty urinating, frequency and vaginal pain.  Musculoskeletal:  Positive for gait  problem. Negative for back pain.  Skin:  Negative for pallor and rash.  Neurological:  Negative for dizziness, tremors, weakness, numbness and headaches.  Psychiatric/Behavioral:  Positive for decreased concentration and dysphoric mood. Negative for confusion and sleep disturbance.     Objective:  BP 130/82 (BP Location: Left Arm, Patient Position: Sitting, Cuff Size: Large)   Pulse 71   Temp 97.9 F (36.6 C) (Oral)   Ht '5\' 4"'$  (1.626 m)   Wt 193 lb (87.5 kg)   SpO2 95%   BMI 33.13 kg/m   BP Readings from Last 3 Encounters:  01/31/22 130/82  01/27/22 126/80  04/20/21 121/78    Wt Readings from Last 3 Encounters:  01/31/22 193 lb (87.5 kg)  04/20/21 187 lb (84.8 kg)  01/18/21 188 lb 3.2 oz (85.4 kg)    Physical Exam Constitutional:      General: She is not in acute distress.    Appearance: She is well-developed. She is obese.  HENT:     Head: Normocephalic.     Right Ear: External ear normal.     Left Ear: External ear normal.     Nose: Nose normal.  Eyes:     General:        Right eye: No discharge.        Left eye: No discharge.     Conjunctiva/sclera: Conjunctivae normal.     Pupils: Pupils are equal, round, and reactive to light.  Neck:  Thyroid: No thyromegaly.     Vascular: No JVD.     Trachea: No tracheal deviation.  Cardiovascular:     Rate and Rhythm: Normal rate and regular rhythm.     Heart sounds: Normal heart sounds.  Pulmonary:     Effort: No respiratory distress.     Breath sounds: No stridor. No wheezing.  Abdominal:     General: Bowel sounds are normal. There is no distension.     Palpations: Abdomen is soft. There is no mass.     Tenderness: There is no abdominal tenderness. There is no guarding or rebound.  Musculoskeletal:        General: No tenderness.     Cervical back: Normal range of motion and neck supple. No rigidity.  Lymphadenopathy:     Cervical: No cervical adenopathy.  Skin:    Findings: No erythema or rash.   Neurological:     Mental Status: She is oriented to person, place, and time.     Cranial Nerves: No cranial nerve deficit.     Motor: Weakness present. No abnormal muscle tone.     Coordination: Coordination abnormal.     Gait: Gait abnormal.     Deep Tendon Reflexes: Reflexes normal.  Psychiatric:        Behavior: Behavior normal.        Thought Content: Thought content normal.        Judgment: Judgment normal.   Tremors, spasms, ataxic  Lab Results  Component Value Date   WBC 8.9 07/19/2020   HGB 14.9 07/19/2020   HCT 42.8 07/19/2020   PLT 294.0 07/19/2020   GLUCOSE 92 07/19/2020   CHOL 243 (H) 01/15/2020   TRIG 222.0 (H) 01/15/2020   HDL 36.10 (L) 01/15/2020   LDLDIRECT 166.0 01/15/2020   ALT 12 07/19/2020   AST 19 07/19/2020   NA 140 07/19/2020   K 4.0 07/19/2020   CL 105 07/19/2020   CREATININE 0.88 07/19/2020   BUN 15 07/19/2020   CO2 27 07/19/2020   TSH 0.57 07/19/2020    MR BRAIN WO CONTRAST  Result Date: 05/14/2020  [facility] [address], [place], [place] [address] [phone] NEUROIMAGING REPORT STUDY DATE: 05/13/2020 PATIENT NAME: TAYANNA TALFORD DOB: [birth year] MRN: [id number] EXAM: MRI Brain without contrast ORDERING CLINICIAN: Marcial Pacas MD, PhD CLINICAL HISTORY: 71 year old woman with blepharospasm COMPARISON FILMS: None TECHNIQUE: MRI of the brain without contrast was obtained utilizing 5 mm axial slices with T1, T2, T2 flair, SWI and diffusion weighted views.  T1 sagittal and T2 coronal views were obtained. CONTRAST: none IMAGING SITE: [facility], [place], [place] FINDINGS: On sagittal images, the spinal cord is imaged caudally to C3 and is normal in caliber.   The contents of the posterior fossa are of normal size and position.   The pituitary gland and optic chiasm appear normal.    Mild generalized cortical atrophy, normal for age.   The ventricles are normal in size and without distortion.  There are no  abnormal extra-axial collections of fluid.  The cerebellum and brainstem appears normal.   The deep gray matter appears normal.  There are a few scattered T2/FLAIR hyperintense foci in the subcortical or deep white matter.  None of the foci appear to be acute..  Diffusion weighted images are normal.  Susceptibility weighted images are normal.  The orbits appear normal.   The VIIth/VIIIth nerve complex appears normal.  The mastoid air cells appear normal.  The paranasal  sinuses appear normal.  Flow voids are identified within the major intracerebral arteries.     This MRI of the brain without contrast shows the following: 1.  Mild generalized cortical atrophy, typical for age. 2.  Few scattered T2/FLAIR hyperintense foci in the hemispheres consistent with minimal age-appropriate chronic microvascular ischemic change.  None of the foci appear to be acute. 3.  There were no acute findings. INTERPRETING PHYSICIAN: Richard A. Felecia Shelling, MD, PhD, FAAN Certified in  Neuroimaging by [employer]    Assessment & Plan:   Problem List Items Addressed This Visit     Blepharospasm (Chronic)    On Rx      GAIT ATAXIA    OT, PT offered - declined Use a cane      Relevant Orders   TSH   CBC with Differential/Platelet   Lipid panel   Comprehensive metabolic panel   Vitamin Q67   Hypothyroidism    Cont w/ Levothroid      Relevant Orders   TSH   CBC with Differential/Platelet   Lipid panel   Comprehensive metabolic panel   Vitamin Y19   Insomnia disorder    Cont w/Zolpidem at hs  Potential benefits of a long term benzodiazepines  use as well as potential risks  and complications were explained to the patient and were aknowledged.      Urinary tract infection    Treated      Relevant Orders   TSH   CBC with Differential/Platelet   Lipid panel   Comprehensive metabolic panel   Vitamin J09   Other Visit Diagnoses     Paresthesia    -  Primary   Relevant Orders    Vitamin B12         Meds ordered this encounter  Medications   zolpidem (AMBIEN CR) 6.25 MG CR tablet    Sig: 1 po qhs prn    Dispense:  90 tablet    Refill:  1    Not to exceed 4 additional fills before 01/14/2022.   alprazolam (XANAX) 2 MG tablet    Sig: Take 1 tablet (2 mg total) by mouth 3 (three) times daily as needed for anxiety.    Dispense:  270 tablet    Refill:  1    This request is for a new prescription for a controlled substance as required by Federal/State law..   sertraline (ZOLOFT) 100 MG tablet    Sig: Take 2 tablets (200 mg total) by mouth daily.    Dispense:  180 tablet    Refill:  1      Follow-up: Return in about 6 months (around 08/02/2022) for a follow-up visit.  Walker Kehr, MD

## 2022-02-10 ENCOUNTER — Other Ambulatory Visit: Payer: Self-pay | Admitting: Internal Medicine

## 2022-02-19 ENCOUNTER — Encounter: Payer: Self-pay | Admitting: Internal Medicine

## 2022-02-21 ENCOUNTER — Telehealth: Payer: Self-pay | Admitting: Internal Medicine

## 2022-02-21 DIAGNOSIS — N390 Urinary tract infection, site not specified: Secondary | ICD-10-CM

## 2022-02-21 DIAGNOSIS — N3281 Overactive bladder: Secondary | ICD-10-CM

## 2022-02-21 NOTE — Telephone Encounter (Signed)
Pt was seen in office on 01/31/22 for a follow up with complaints of a UTI. She took antibiotics and is still not getting better. Pt had an appt scheduled for 7/12 but decided to cancel. Pt stated she does not want the appointment because she is sure she needs a urologist. Pt would like a referral to urology. Tried to get pt to keep appointment but she refused and stated she was just seen in the last month for this issue and had a urine test done.    Please advise  CB: [phone]

## 2022-02-22 ENCOUNTER — Ambulatory Visit: Payer: Medicare Other | Admitting: Internal Medicine

## 2022-02-23 NOTE — Telephone Encounter (Signed)
OK. Thx

## 2022-03-15 ENCOUNTER — Ambulatory Visit: Payer: Medicare Other | Admitting: Emergency Medicine

## 2022-03-15 ENCOUNTER — Encounter: Payer: Self-pay | Admitting: Internal Medicine

## 2022-03-15 ENCOUNTER — Ambulatory Visit (INDEPENDENT_AMBULATORY_CARE_PROVIDER_SITE_OTHER): Payer: Medicare Other | Admitting: Internal Medicine

## 2022-03-15 ENCOUNTER — Emergency Department
Admission: EM | Admit: 2022-03-15 | Discharge: 2022-03-15 | Disposition: A | Discharge status: Home / Self Care | Payer: Medicare Other | Attending: Emergency Medicine | Admitting: Emergency Medicine

## 2022-03-15 DIAGNOSIS — G245 Blepharospasm: Secondary | ICD-10-CM | POA: Diagnosis not present

## 2022-03-15 DIAGNOSIS — R269 Unspecified abnormalities of gait and mobility: Secondary | ICD-10-CM

## 2022-03-15 DIAGNOSIS — N3281 Overactive bladder: Secondary | ICD-10-CM | POA: Diagnosis not present

## 2022-03-15 DIAGNOSIS — R000 Tachycardia, unspecified: Secondary | ICD-10-CM | POA: Diagnosis not present

## 2022-03-15 DIAGNOSIS — R0902 Hypoxemia: Secondary | ICD-10-CM | POA: Diagnosis not present

## 2022-03-15 DIAGNOSIS — T7840XA Allergy, unspecified, initial encounter: Secondary | ICD-10-CM

## 2022-03-15 DIAGNOSIS — I10 Essential (primary) hypertension: Secondary | ICD-10-CM | POA: Diagnosis not present

## 2022-03-15 DIAGNOSIS — F5104 Psychophysiologic insomnia: Secondary | ICD-10-CM

## 2022-03-15 DIAGNOSIS — R1084 Generalized abdominal pain: Secondary | ICD-10-CM | POA: Diagnosis not present

## 2022-03-15 DIAGNOSIS — N390 Urinary tract infection, site not specified: Secondary | ICD-10-CM | POA: Diagnosis not present

## 2022-03-15 DIAGNOSIS — T782XXA Anaphylactic shock, unspecified, initial encounter: Secondary | ICD-10-CM | POA: Insufficient documentation

## 2022-03-15 DIAGNOSIS — R609 Edema, unspecified: Secondary | ICD-10-CM | POA: Diagnosis not present

## 2022-03-15 LAB — URINALYSIS
Bilirubin Urine: NEGATIVE
Hgb urine dipstick: NEGATIVE
Ketones, ur: NEGATIVE
Leukocytes,Ua: NEGATIVE
Nitrite: NEGATIVE
Specific Gravity, Urine: 1.01 (ref 1.000–1.030)
Total Protein, Urine: NEGATIVE
Urine Glucose: NEGATIVE
Urobilinogen, UA: 0.2 (ref 0.0–1.0)
pH: 7.5 (ref 5.0–8.0)

## 2022-03-15 LAB — CBC
HCT: 42.9 % (ref 36.0–46.0)
Hemoglobin: 14.3 g/dL (ref 12.0–15.0)
MCH: 31.3 pg (ref 26.0–34.0)
MCHC: 33.3 g/dL (ref 30.0–36.0)
MCV: 93.9 fL (ref 80.0–100.0)
Platelets: 364 10*3/uL (ref 150–400)
RBC: 4.57 MIL/uL (ref 3.87–5.11)
RDW: 12.9 % (ref 11.5–15.5)
WBC: 24.2 10*3/uL — ABNORMAL HIGH (ref 4.0–10.5)
nRBC: 0 % (ref 0.0–0.2)

## 2022-03-15 LAB — COMPREHENSIVE METABOLIC PANEL
ALT: 13 U/L (ref 0–44)
AST: 27 U/L (ref 15–41)
Albumin: 4 g/dL (ref 3.5–5.0)
Alkaline Phosphatase: 61 U/L (ref 38–126)
Anion gap: 10 (ref 5–15)
BUN: 16 mg/dL (ref 8–23)
CO2: 21 mmol/L — ABNORMAL LOW (ref 22–32)
Calcium: 8.6 mg/dL — ABNORMAL LOW (ref 8.9–10.3)
Chloride: 110 mmol/L (ref 98–111)
Creatinine, Ser: 1.13 mg/dL — ABNORMAL HIGH (ref 0.44–1.00)
GFR, Estimated: 52 mL/min — ABNORMAL LOW (ref >60)
Glucose, Bld: 145 mg/dL — ABNORMAL HIGH (ref 70–99)
Potassium: 3.2 mmol/L — ABNORMAL LOW (ref 3.5–5.1)
Sodium: 141 mmol/L (ref 135–145)
Total Bilirubin: 0.9 mg/dL (ref 0.3–1.2)
Total Protein: 7.4 g/dL (ref 6.5–8.1)

## 2022-03-15 MED ORDER — EPINEPHRINE 0.3 MG/0.3ML IJ SOAJ: 0.3000 mg | INTRAMUSCULAR | 1 refills | Status: DC | PRN

## 2022-03-15 MED ORDER — NITROFURANTOIN MONOHYD MACRO 100 MG PO CAPS: 100.0000 mg | ORAL_CAPSULE | Freq: Two times a day (BID) | ORAL | 2 refills | Status: DC

## 2022-03-15 MED ORDER — ESTRADIOL 0.1 MG/GM VA CREA: 1.0000 | TOPICAL_CREAM | Freq: Every day | VAGINAL | 5 refills | Status: DC

## 2022-03-15 MED ORDER — METHYLPREDNISOLONE SODIUM SUCC 125 MG IJ SOLR
125.0000 mg | Freq: Once | INTRAMUSCULAR | Status: AC
  Administered 2022-03-15: 125 mg via INTRAVENOUS
  Filled 2022-03-15: qty 2

## 2022-03-15 MED ORDER — SODIUM CHLORIDE 0.9 % IV BOLUS
1000.0000 mL | Freq: Once | INTRAVENOUS | Status: AC
  Administered 2022-03-15: 1000 mL via INTRAVENOUS

## 2022-03-15 MED ORDER — NITROFURANTOIN MONOHYD MACRO 100 MG PO CAPS
100.0000 mg | ORAL_CAPSULE | Freq: Two times a day (BID) | ORAL | 2 refills | Status: DC
Start: 2022-03-15 — End: 2022-03-19

## 2022-03-15 MED ORDER — OXYBUTYNIN CHLORIDE 5 MG PO TABS: 5.0000 mg | ORAL_TABLET | Freq: Two times a day (BID) | ORAL | 5 refills | Status: DC

## 2022-03-15 NOTE — Progress Notes (Signed)
Subjective:  Patient ID: Linda Hayes, female    DOB: Jul 15, 1951  Age: 71 y.o. MRN: [id number]  CC: No chief complaint on file.   HPI PAYTEN HOBIN presents for urinary frequency, urgency, pain C/o vaginal dryness  Outpatient Medications Prior to Visit  Medication Sig Dispense Refill   alprazolam (XANAX) 2 MG tablet Take 1 tablet (2 mg total) by mouth 3 (three) times daily as needed for anxiety. 270 tablet 1   Cholecalciferol (VITAMIN D3) 50 MCG (2000 UT) capsule Take 1 capsule (2,000 Units total) by mouth daily. 100 capsule 3   sertraline (ZOLOFT) 100 MG tablet Take 2 tablets (200 mg total) by mouth daily. 180 tablet 1   SYNTHROID 125 MCG tablet TAKE 1 TABLET BY MOUTH EVERY DAY 90 tablet 3   zolpidem (AMBIEN CR) 6.25 MG CR tablet 1 po qhs prn 90 tablet 1   nitrofurantoin, macrocrystal-monohydrate, (MACROBID) 100 MG capsule Take 1 capsule (100 mg total) by mouth 2 (two) times daily. 14 capsule 0   cephALEXin (KEFLEX) 500 MG capsule Take 1 capsule (500 mg total) by mouth 2 (two) times daily. 14 capsule 0   Facility-Administered Medications Prior to Visit  Medication Dose Route Frequency Provider Last Rate Last Admin   incobotulinumtoxinA (XEOMIN) 100 units injection 100 Units  100 Units Intramuscular Q90 days Marcial Pacas, MD   100 Units at 04/20/21 1355   incobotulinumtoxinA (XEOMIN) 50 units injection 50 Units  50 Units Intramuscular Q90 days Marcial Pacas, MD   50 Units at 04/20/21 1354    ROS: Review of Systems  Constitutional:  Positive for fatigue. Negative for activity change, appetite change, chills and unexpected weight change.  HENT:  Negative for congestion, mouth sores and sinus pressure.   Eyes:  Negative for visual disturbance.  Respiratory:  Negative for cough and chest tightness.   Gastrointestinal:  Negative for abdominal pain and nausea.  Genitourinary:  Positive for frequency and urgency. Negative for difficulty urinating and vaginal pain.  Musculoskeletal:   Negative for back pain and gait problem.  Skin:  Negative for pallor and rash.  Neurological:  Positive for dizziness and tremors. Negative for weakness, numbness and headaches.  Psychiatric/Behavioral:  Positive for sleep disturbance. Negative for confusion and suicidal ideas. The patient is nervous/anxious.     Objective:  BP 120/70 (BP Location: Right Arm, Patient Position: Sitting, Cuff Size: Normal)   Pulse 72   Temp 97.8 F (36.6 C) (Oral)   Ht '5\' 4"'$  (1.626 m)   SpO2 99%   BMI 33.13 kg/m   BP Readings from Last 3 Encounters:  03/15/22 (!) 100/52  03/15/22 120/70  01/31/22 130/82    Wt Readings from Last 3 Encounters:  03/15/22 180 lb (81.6 kg)  01/31/22 193 lb (87.5 kg)  04/20/21 187 lb (84.8 kg)    Physical Exam Constitutional:      General: She is not in acute distress.    Appearance: She is well-developed. She is obese.  HENT:     Head: Normocephalic.     Right Ear: External ear normal.     Left Ear: External ear normal.     Nose: Nose normal.  Eyes:     General:        Right eye: No discharge.        Left eye: No discharge.     Conjunctiva/sclera: Conjunctivae normal.     Pupils: Pupils are equal, round, and reactive to light.  Neck:     Thyroid: No  thyromegaly.     Vascular: No JVD.     Trachea: No tracheal deviation.  Cardiovascular:     Rate and Rhythm: Normal rate and regular rhythm.     Heart sounds: Normal heart sounds.  Pulmonary:     Effort: No respiratory distress.     Breath sounds: No stridor. No wheezing.  Abdominal:     General: Bowel sounds are normal. There is no distension.     Palpations: Abdomen is soft. There is no mass.     Tenderness: There is no abdominal tenderness. There is no guarding or rebound.  Musculoskeletal:        General: No tenderness.     Cervical back: Normal range of motion and neck supple. No rigidity.  Lymphadenopathy:     Cervical: No cervical adenopathy.  Skin:    Findings: No erythema or rash.   Neurological:     Mental Status: She is oriented to person, place, and time.     Cranial Nerves: No cranial nerve deficit.     Motor: No abnormal muscle tone.     Coordination: Coordination normal.     Gait: Gait abnormal.     Deep Tendon Reflexes: Reflexes normal.  Psychiatric:        Behavior: Behavior normal.        Thought Content: Thought content normal.        Judgment: Judgment normal.   Tremor, tic  Lab Results  Component Value Date   WBC 24.2 (H) 03/15/2022   HGB 14.3 03/15/2022   HCT 42.9 03/15/2022   PLT 364 03/15/2022   GLUCOSE 145 (H) 03/15/2022   CHOL 235 (H) 01/31/2022   TRIG 267.0 (H) 01/31/2022   HDL 35.30 (L) 01/31/2022   LDLDIRECT 156.0 01/31/2022   ALT 13 03/15/2022   AST 27 03/15/2022   NA 141 03/15/2022   K 3.2 (L) 03/15/2022   CL 110 03/15/2022   CREATININE 1.13 (H) 03/15/2022   BUN 16 03/15/2022   CO2 21 (L) 03/15/2022   TSH 1.28 01/31/2022    MR BRAIN WO CONTRAST  Result Date: 05/14/2020  [facility] [address], [place], [place] [address] [phone] NEUROIMAGING REPORT STUDY DATE: 05/13/2020 PATIENT NAME: IGNACIA GENTZLER DOB: 08-20-1950 MRN: [id number] EXAM: MRI Brain without contrast ORDERING CLINICIAN: Marcial Pacas MD, PhD CLINICAL HISTORY: 71 year old woman with blepharospasm COMPARISON FILMS: None TECHNIQUE: MRI of the brain without contrast was obtained utilizing 5 mm axial slices with T1, T2, T2 flair, SWI and diffusion weighted views.  T1 sagittal and T2 coronal views were obtained. CONTRAST: none IMAGING SITE: [facility], [place], [place] FINDINGS: On sagittal images, the spinal cord is imaged caudally to C3 and is normal in caliber.   The contents of the posterior fossa are of normal size and position.   The pituitary gland and optic chiasm appear normal.    Mild generalized cortical atrophy, normal for age.   The ventricles are normal in size and without distortion.  There are no abnormal  extra-axial collections of fluid.  The cerebellum and brainstem appears normal.   The deep gray matter appears normal.  There are a few scattered T2/FLAIR hyperintense foci in the subcortical or deep white matter.  None of the foci appear to be acute..  Diffusion weighted images are normal.  Susceptibility weighted images are normal.  The orbits appear normal.   The VIIth/VIIIth nerve complex appears normal.  The mastoid air cells appear normal.  The paranasal  sinuses appear normal.  Flow voids are identified within the major intracerebral arteries.     This MRI of the brain without contrast shows the following: 1.  Mild generalized cortical atrophy, typical for age. 2.  Few scattered T2/FLAIR hyperintense foci in the hemispheres consistent with minimal age-appropriate chronic microvascular ischemic change.  None of the foci appear to be acute. 3.  There were no acute findings. INTERPRETING PHYSICIAN: Richard A. Felecia Shelling, MD, PhD, FAAN Certified in  Neuroimaging by [employer]    Assessment & Plan:   Problem List Items Addressed This Visit     Blepharospasm (Chronic)    No change      GAIT ATAXIA    Chronic.  Use a cane      Insomnia disorder    Continue with zolpidem as needed      OAB (overactive bladder)    Worse UA abnd Cx Refilled Macrobid Estrogen cream  Urology consult Dr Claudia Desanctis        Relevant Orders   Ambulatory referral to Urology   Urinalysis (Completed)   CULTURE, URINE COMPREHENSIVE (Completed)   Urinary tract infection    Recurrent.  Prescribed Macrobid         Meds ordered this encounter  Medications   oxybutynin (DITROPAN) 5 MG tablet    Sig: Take 1 tablet (5 mg total) by mouth 2 (two) times daily.    Dispense:  60 tablet    Refill:  5   estradiol (ESTRACE VAGINAL) 0.1 MG/GM vaginal cream    Sig: Place 1 Applicatorful vaginally at bedtime.    Dispense:  42.5 g    Refill:  5   DISCONTD: nitrofurantoin, macrocrystal-monohydrate,  (MACROBID) 100 MG capsule    Sig: Take 1 capsule (100 mg total) by mouth 2 (two) times daily.    Dispense:  14 capsule    Refill:  2   DISCONTD: nitrofurantoin, macrocrystal-monohydrate, (MACROBID) 100 MG capsule    Sig: Take 1 capsule (100 mg total) by mouth 2 (two) times daily.    Dispense:  14 capsule    Refill:  2      Follow-up: Return in about 3 months (around 06/15/2022) for a follow-up visit.  Walker Kehr, MD

## 2022-03-15 NOTE — Assessment & Plan Note (Addendum)
Worse UA abnd Cx Refilled Macrobid Estrogen cream  Urology consult Dr Claudia Desanctis

## 2022-03-15 NOTE — Discharge Instructions (Addendum)
Please follow-up with your doctor tomorrow for recheck/reevaluation.  Take 2 capsules of Benadryl if you have any further itching or rash.  Return immediately to the emergency department for any return of swelling to the mouth or tongue or any difficulty breathing.  You have been prescribed an EpiPen please use this only as we discussed.  Please follow-up with your doctor tomorrow regarding your urine culture to see if you need to be started on a different antibiotic.

## 2022-03-15 NOTE — ED Provider Notes (Signed)
[facility] Provider Note    Event Date/Time   First MD Initiated Contact with Patient 03/15/22 2006     (approximate)  History   Chief Complaint: Allergic Reaction (Patient C/O allergic reaction to macrobid that she's been taking for a bladder infection. Tremors at baseline)  HPI  Linda Hayes is a 71 y.o. female with multiple allergic reactions in the past who presents to the emergency department for an allergic reaction to new antibiotic.  According to the patient she was started on antibiotic for a bladder infection by her primary care doctor.  Patient states shortly after beginning that medication she began feeling very weak and flushed.  Patient states she felt swelling in the right side of her tongue so she came to the emergency department for evaluation.  EMS noted the swelling as well give the patient 0.3 mg of epinephrine, 50 mg of IV Benadryl and 200 cc of normal saline.  Here the patient continues to state weakness continues to appear flushed but states the swelling feels like it is going down in her tongue.  Patient is quite tremulous but states this is baseline due to an underlying neurological disorder.  Physical Exam   Most recent vital signs: There were no vitals filed for this visit.  General: Awake, no distress.  Tremulous which she states is baseline. CV:  Good peripheral perfusion.  Regular rate and rhythm  Resp:  Normal effort.  Equal breath sounds bilaterally.  No wheeze. Abd:  No distention.  Soft, nontender.  No rebound or guarding. Other:  Patient does have mild right-sided tongue swelling/edema on my evaluation.  Widely patent oropharynx.   ED Results / Procedures / Treatments   MEDICATIONS ORDERED IN ED: Medications - No data to display   IMPRESSION / MDM / [place] / ED COURSE  I reviewed the triage vital signs and the nursing notes.  Patient's presentation is most consistent with acute presentation with potential  threat to life or bodily function.  Patient presents emergency department for taking a new antibiotic for a bladder infection.  Shortly after beginning this antibiotic patient developed weakness flushing, tongue swelling.  Patient received an EpiPen, 50 mg of IV Benadryl 200 cc of normal saline.  We will add 125 mg of IV Solu-Medrol continue with IV hydration.  We will check basic labs and continue to closely monitor.  Patient states he feels like the tongue swelling is coming down somewhat.  I reviewed the patient's note from today by her physician.  It appears that the patient was started both on Keflex as well as Macrobid.  Patient also received Xeomin in the office however I reviewed the prior notes and the patient has received this medication previously as well.  So does not entirely clear what the patient's allergy was due to, patient suspects Macrobid.  I reviewed the patient's chart further patient was discharged with oxybutynin and Macrobid.  Discussed with patient discontinue both these medications.  It looks like her doctor sent a urine culture.  We will hold off on additional antibiotics given the patient's significant amount of allergies until her urine culture grows out and she follows up with her doctor.  Patient's lab work does show moderate leukocytosis of 24,000 chemistry is normal.  Patient did receive IV Solu-Medrol prior to her lab work being drawn.  Patient states he is feeling much better.  States she is ready to go home.  No tongue swelling on recheck.  Discussed  with the patient follow-up with her primary care doctor in the next day or 2.  We will prescribe the patient EpiPen so she has at home I discussed when and how to use these.  Patient agreeable to plan of care.  FINAL CLINICAL IMPRESSION(S) / ED DIAGNOSES   Allergic reaction Anaphylaxis    Note:  This document was prepared using Dragon voice recognition software and may include unintentional dictation errors.    Harvest Dark, MD 03/15/22 2240

## 2022-03-16 ENCOUNTER — Encounter: Payer: Self-pay | Admitting: Internal Medicine

## 2022-03-17 ENCOUNTER — Encounter: Payer: Self-pay | Admitting: Internal Medicine

## 2022-03-17 LAB — CULTURE, URINE COMPREHENSIVE: RESULT:: NO GROWTH

## 2022-03-19 ENCOUNTER — Encounter: Payer: Self-pay | Admitting: Internal Medicine

## 2022-03-19 ENCOUNTER — Other Ambulatory Visit: Payer: Self-pay | Admitting: Internal Medicine

## 2022-03-20 ENCOUNTER — Ambulatory Visit: Payer: Medicare Other

## 2022-03-20 NOTE — Assessment & Plan Note (Signed)
Continue with zolpidem as needed

## 2022-03-20 NOTE — Assessment & Plan Note (Signed)
Chronic.  Use a cane

## 2022-03-20 NOTE — Assessment & Plan Note (Signed)
No change 

## 2022-03-20 NOTE — Assessment & Plan Note (Signed)
Recurrent.  Prescribed Macrobid

## 2022-03-28 ENCOUNTER — Encounter: Payer: Self-pay | Admitting: Internal Medicine

## 2022-03-29 ENCOUNTER — Ambulatory Visit: Payer: Medicare Other | Admitting: Internal Medicine

## 2022-03-31 ENCOUNTER — Other Ambulatory Visit: Payer: Self-pay | Admitting: Internal Medicine

## 2022-03-31 MED ORDER — POTASSIUM CHLORIDE CRYS ER 20 MEQ PO TBCR: 20.0000 meq | EXTENDED_RELEASE_TABLET | Freq: Every day | ORAL | 3 refills | Status: DC

## 2022-04-03 ENCOUNTER — Ambulatory Visit (INDEPENDENT_AMBULATORY_CARE_PROVIDER_SITE_OTHER): Payer: Medicare Other | Admitting: Internal Medicine

## 2022-04-03 ENCOUNTER — Encounter: Payer: Self-pay | Admitting: Internal Medicine

## 2022-04-03 DIAGNOSIS — Z889 Allergy status to unspecified drugs, medicaments and biological substances status: Secondary | ICD-10-CM | POA: Diagnosis not present

## 2022-04-03 DIAGNOSIS — N3281 Overactive bladder: Secondary | ICD-10-CM | POA: Diagnosis not present

## 2022-04-03 DIAGNOSIS — N390 Urinary tract infection, site not specified: Secondary | ICD-10-CM

## 2022-04-03 DIAGNOSIS — E876 Hypokalemia: Secondary | ICD-10-CM | POA: Diagnosis not present

## 2022-04-03 LAB — CBC WITH DIFFERENTIAL/PLATELET
Basophils Absolute: 0.1 10*3/uL (ref 0.0–0.1)
Basophils Relative: 1.1 % (ref 0.0–3.0)
Eosinophils Absolute: 0.2 10*3/uL (ref 0.0–0.7)
Eosinophils Relative: 1.7 % (ref 0.0–5.0)
HCT: 42.1 % (ref 36.0–46.0)
Hemoglobin: 14.3 g/dL (ref 12.0–15.0)
Lymphocytes Relative: 37.4 % (ref 12.0–46.0)
Lymphs Abs: 3.9 10*3/uL (ref 0.7–4.0)
MCHC: 34 g/dL (ref 30.0–36.0)
MCV: 93.4 fl (ref 78.0–100.0)
Monocytes Absolute: 0.8 10*3/uL (ref 0.1–1.0)
Monocytes Relative: 7.4 % (ref 3.0–12.0)
Neutro Abs: 5.4 10*3/uL (ref 1.4–7.7)
Neutrophils Relative %: 52.4 % (ref 43.0–77.0)
Platelets: 396 10*3/uL (ref 150.0–400.0)
RBC: 4.5 Mil/uL (ref 3.87–5.11)
RDW: 13.6 % (ref 11.5–15.5)
WBC: 10.4 10*3/uL (ref 4.0–10.5)

## 2022-04-03 LAB — COMPREHENSIVE METABOLIC PANEL WITH GFR
ALT: 16 U/L (ref 0–35)
AST: 22 U/L (ref 0–37)
Albumin: 4.2 g/dL (ref 3.5–5.2)
Alkaline Phosphatase: 62 U/L (ref 39–117)
BUN: 13 mg/dL (ref 6–23)
CO2: 27 meq/L (ref 19–32)
Calcium: 9.5 mg/dL (ref 8.4–10.5)
Chloride: 104 meq/L (ref 96–112)
Creatinine, Ser: 0.82 mg/dL (ref 0.40–1.20)
GFR: 72.63 mL/min
Glucose, Bld: 89 mg/dL (ref 70–99)
Potassium: 4.3 meq/L (ref 3.5–5.1)
Sodium: 139 meq/L (ref 135–145)
Total Bilirubin: 0.6 mg/dL (ref 0.2–1.2)
Total Protein: 7.7 g/dL (ref 6.0–8.3)

## 2022-04-03 NOTE — Patient Instructions (Signed)
For a mild COVID-19 case - take zinc 50 mg a day for 1 week, vitamin C 1000 mg daily for 1 week, vitamin D2 50,000 units weekly for 2 months (unless  taking vitamin D daily already), an antioxidant Quercetin 500 mg twice a day for 1 week (if you can get it quick enough). Take Allegra or Benadryl.  Maintain good oral hydration and take Tylenol for high fever.  Call if problems. Isolate for 5 days, then wear a mask for 5 days per CDC.  

## 2022-04-03 NOTE — Progress Notes (Signed)
Subjective:  Patient ID: Linda Hayes, female    DOB: [birth year]  Age: 71 y.o. MRN: [id number]  CC: Follow-up (ER FOLLOW-UP- Need to know if she should be taking potassium)   HPI Linda Hayes presents for allergic reaction w/Macrobid. Pt went to ER and was given Epi-pen. OAB is better  Outpatient Medications Prior to Visit  Medication Sig Dispense Refill   alprazolam (XANAX) 2 MG tablet Take 1 tablet (2 mg total) by mouth 3 (three) times daily as needed for anxiety. 270 tablet 1   Cholecalciferol (VITAMIN D3) 50 MCG (2000 UT) capsule Take 1 capsule (2,000 Units total) by mouth daily. 100 capsule 3   EPINEPHrine (EPIPEN 2-PAK) 0.3 mg/0.3 mL IJ SOAJ injection Inject 0.3 mg into the muscle as needed for anaphylaxis. 1 each 1   estradiol (ESTRACE VAGINAL) 0.1 MG/GM vaginal cream Place 1 Applicatorful vaginally at bedtime. 42.5 g 5   sertraline (ZOLOFT) 100 MG tablet Take 2 tablets (200 mg total) by mouth daily. 180 tablet 1   SYNTHROID 125 MCG tablet TAKE 1 TABLET BY MOUTH EVERY DAY 90 tablet 3   zolpidem (AMBIEN CR) 6.25 MG CR tablet 1 po qhs prn 90 tablet 1   potassium chloride SA (KLOR-CON M) 20 MEQ tablet Take 1 tablet (20 mEq total) by mouth daily. (Patient not taking: Reported on 04/03/2022) 90 tablet 3   oxybutynin (DITROPAN) 5 MG tablet Take 1 tablet (5 mg total) by mouth 2 (two) times daily. (Patient not taking: Reported on 04/03/2022) 60 tablet 5   incobotulinumtoxinA (XEOMIN) 100 units injection 100 Units      incobotulinumtoxinA (XEOMIN) 50 units injection 50 Units      No facility-administered medications prior to visit.    ROS: Review of Systems  Constitutional:  Negative for activity change, appetite change, chills, fatigue and unexpected weight change.  HENT:  Negative for congestion, mouth sores and sinus pressure.   Eyes:  Negative for visual disturbance.  Respiratory:  Negative for cough and chest tightness.   Gastrointestinal:  Negative for abdominal pain and  nausea.  Genitourinary:  Negative for difficulty urinating, frequency and vaginal pain.  Musculoskeletal:  Negative for back pain and gait problem.  Skin:  Negative for pallor and rash.  Neurological:  Negative for dizziness, tremors, weakness, numbness and headaches.  Psychiatric/Behavioral:  Negative for confusion and sleep disturbance.     Objective:  BP 130/76 (BP Location: Left Arm)   Pulse 86   Temp 98.4 F (36.9 C) (Oral)   Ht '5\' 4"'$  (1.626 m)   Wt 184 lb (83.5 kg)   SpO2 95%   BMI 31.58 kg/m   BP Readings from Last 3 Encounters:  04/03/22 130/76  03/15/22 (!) 100/52  03/15/22 120/70    Wt Readings from Last 3 Encounters:  04/03/22 184 lb (83.5 kg)  03/15/22 180 lb (81.6 kg)  01/31/22 193 lb (87.5 kg)    Physical Exam Constitutional:      General: She is not in acute distress.    Appearance: She is well-developed.  HENT:     Head: Normocephalic.     Right Ear: External ear normal.     Left Ear: External ear normal.     Nose: Nose normal.  Eyes:     General:        Right eye: No discharge.        Left eye: No discharge.     Conjunctiva/sclera: Conjunctivae normal.     Pupils: Pupils are equal,  round, and reactive to light.  Neck:     Thyroid: No thyromegaly.     Vascular: No JVD.     Trachea: No tracheal deviation.  Cardiovascular:     Rate and Rhythm: Normal rate and regular rhythm.     Heart sounds: Normal heart sounds.  Pulmonary:     Effort: No respiratory distress.     Breath sounds: No stridor. No wheezing.  Abdominal:     General: Bowel sounds are normal. There is no distension.     Palpations: Abdomen is soft. There is no mass.     Tenderness: There is no abdominal tenderness. There is no guarding or rebound.  Musculoskeletal:        General: No tenderness.     Cervical back: Normal range of motion and neck supple. No rigidity.  Lymphadenopathy:     Cervical: No cervical adenopathy.  Skin:    Findings: No erythema or rash.  Neurological:      Cranial Nerves: No cranial nerve deficit.     Motor: No abnormal muscle tone.     Coordination: Coordination normal.     Deep Tendon Reflexes: Reflexes normal.  Psychiatric:        Behavior: Behavior normal.        Thought Content: Thought content normal.        Judgment: Judgment normal.     Lab Results  Component Value Date   WBC 24.2 (H) 03/15/2022   HGB 14.3 03/15/2022   HCT 42.9 03/15/2022   PLT 364 03/15/2022   GLUCOSE 145 (H) 03/15/2022   CHOL 235 (H) 01/31/2022   TRIG 267.0 (H) 01/31/2022   HDL 35.30 (L) 01/31/2022   LDLDIRECT 156.0 01/31/2022   ALT 13 03/15/2022   AST 27 03/15/2022   NA 141 03/15/2022   K 3.2 (L) 03/15/2022   CL 110 03/15/2022   CREATININE 1.13 (H) 03/15/2022   BUN 16 03/15/2022   CO2 21 (L) 03/15/2022   TSH 1.28 01/31/2022    No results found.  Assessment & Plan:   Problem List Items Addressed This Visit     Drug allergy    Macrobid related. OAB is better. She can tolerate Keflex ok prn. Epi-pen, Benadryl Allergy appt was offered and declined      Relevant Orders   CBC with Differential/Platelet   Hypokalemia   Relevant Orders   Comprehensive metabolic panel   OAB (overactive bladder)     UA abnd Cx - Enterococcus  Macrobid allergic - d/c  OAB is better. She can tolerate Keflex ok prn. Epi-pen, Benadryl Estrogen cream  Urology consult Dr Claudia Desanctis - pt declined      Relevant Orders   Comprehensive metabolic panel   CBC with Differential/Platelet   Urinary tract infection    OAB is better. She can tolerate Keflex ok prn       Relevant Orders   CBC with Differential/Platelet      No orders of the defined types were placed in this encounter.     Follow-up: Return in about 3 months (around 07/04/2022) for a follow-up visit.  Walker Kehr, MD

## 2022-04-03 NOTE — Assessment & Plan Note (Addendum)
Macrobid related. OAB is better. She can tolerate Keflex ok prn. Epi-pen, Benadryl Allergy appt was offered and declined

## 2022-04-03 NOTE — Assessment & Plan Note (Signed)
OAB is better. She can tolerate Keflex ok prn

## 2022-04-03 NOTE — Assessment & Plan Note (Signed)
  UA abnd Cx - Enterococcus  Macrobid allergic - d/c  OAB is better. She can tolerate Keflex ok prn. Epi-pen, Benadryl Estrogen cream  Urology consult Dr Claudia Desanctis - pt declined

## 2022-05-02 ENCOUNTER — Ambulatory Visit (INDEPENDENT_AMBULATORY_CARE_PROVIDER_SITE_OTHER): Payer: Medicare Other

## 2022-05-02 VITALS — Ht 64.0 in | Wt 184.0 lb

## 2022-05-02 DIAGNOSIS — Z0000 Encounter for general adult medical examination without abnormal findings: Secondary | ICD-10-CM | POA: Diagnosis not present

## 2022-05-02 NOTE — Patient Instructions (Signed)
[name] , Thank you for taking time to come for your Medicare Wellness Visit. I appreciate your ongoing commitment to your health goals. Please review the following plan we discussed and let me know if I can assist you in the future.   Screening recommendations/referrals: Colonoscopy: 03/22/2020; due every 5 years Mammogram: last done 02/02/2016 Bone Density: never done Recommended yearly ophthalmology/optometry visit for glaucoma screening and checkup Recommended yearly dental visit for hygiene and checkup  Vaccinations: Influenza vaccine: due Fall 2023 Pneumococcal vaccine: declined Tdap vaccine: declined Shingles vaccine: declined   Covid-19:10/09/2019, 11/10/2019  Advanced directives: No; in the process of updating paperwork.  Conditions/risks identified: Yes  Next appointment: Follow up in one year for your annual wellness visit.   Preventive Care 76 Years and Older, Female Preventive care refers to lifestyle choices and visits with your health care provider that can promote health and wellness. What does preventive care include? A yearly physical exam. This is also called an annual well check. Dental exams once or twice a year. Routine eye exams. Ask your health care provider how often you should have your eyes checked. Personal lifestyle choices, including: Daily care of your teeth and gums. Regular physical activity. Eating a healthy diet. Avoiding tobacco and drug use. Limiting alcohol use. Practicing safe sex. Taking low-dose aspirin every day. Taking vitamin and mineral supplements as recommended by your health care provider. What happens during an annual well check? The services and screenings done by your health care provider during your annual well check will depend on your age, overall health, lifestyle risk factors, and family history of disease. Counseling  Your health care provider may ask you questions about your: Alcohol use. Tobacco use. Drug  use. Emotional well-being. Home and relationship well-being. Sexual activity. Eating habits. History of falls. Memory and ability to understand (cognition). Work and work Statistician. Reproductive health. Screening  You may have the following tests or measurements: Height, weight, and BMI. Blood pressure. Lipid and cholesterol levels. These may be checked every 5 years, or more frequently if you are over 46 years old. Skin check. Lung cancer screening. You may have this screening every year starting at age 19 if you have a 30-pack-year history of smoking and currently smoke or have quit within the past 15 years. Fecal occult blood test (FOBT) of the stool. You may have this test every year starting at age 71. Flexible sigmoidoscopy or colonoscopy. You may have a sigmoidoscopy every 5 years or a colonoscopy every 10 years starting at age 51. Hepatitis C blood test. Hepatitis B blood test. Sexually transmitted disease (STD) testing. Diabetes screening. This is done by checking your blood sugar (glucose) after you have not eaten for a while (fasting). You may have this done every 1-3 years. Bone density scan. This is done to screen for osteoporosis. You may have this done starting at age 33. Mammogram. This may be done every 1-2 years. Talk to your health care provider about how often you should have regular mammograms. Talk with your health care provider about your test results, treatment options, and if necessary, the need for more tests. Vaccines  Your health care provider may recommend certain vaccines, such as: Influenza vaccine. This is recommended every year. Tetanus, diphtheria, and acellular pertussis (Tdap, Td) vaccine. You may need a Td booster every 10 years. Zoster vaccine. You may need this after age 47. Pneumococcal 13-valent conjugate (PCV13) vaccine. One dose is recommended after age 57. Pneumococcal polysaccharide (PPSV23) vaccine. One dose is recommended  after age  86. Talk to your health care provider about which screenings and vaccines you need and how often you need them. This information is not intended to replace advice given to you by your health care provider. Make sure you discuss any questions you have with your health care provider. Document Released: 08/27/2015 Document Revised: 04/19/2016 Document Reviewed: 06/01/2015 Elsevier Interactive Patient Education  2017 [place] Prevention in the Home Falls can cause injuries. They can happen to people of all ages. There are many things you can do to make your home safe and to help prevent falls. What can I do on the outside of my home? Regularly fix the edges of walkways and driveways and fix any cracks. Remove anything that might make you trip as you walk through a door, such as a raised step or threshold. Trim any bushes or trees on the path to your home. Use bright outdoor lighting. Clear any walking paths of anything that might make someone trip, such as rocks or tools. Regularly check to see if handrails are loose or broken. Make sure that both sides of any steps have handrails. Any raised decks and porches should have guardrails on the edges. Have any leaves, snow, or ice cleared regularly. Use sand or salt on walking paths during winter. Clean up any spills in your garage right away. This includes oil or grease spills. What can I do in the bathroom? Use night lights. Install grab bars by the toilet and in the tub and shower. Do not use towel bars as grab bars. Use non-skid mats or decals in the tub or shower. If you need to sit down in the shower, use a plastic, non-slip stool. Keep the floor dry. Clean up any water that spills on the floor as soon as it happens. Remove soap buildup in the tub or shower regularly. Attach bath mats securely with double-sided non-slip rug tape. Do not have throw rugs and other things on the floor that can make you trip. What can I do in the  bedroom? Use night lights. Make sure that you have a light by your bed that is easy to reach. Do not use any sheets or blankets that are too big for your bed. They should not hang down onto the floor. Have a firm chair that has side arms. You can use this for support while you get dressed. Do not have throw rugs and other things on the floor that can make you trip. What can I do in the kitchen? Clean up any spills right away. Avoid walking on wet floors. Keep items that you use a lot in easy-to-reach places. If you need to reach something above you, use a strong step stool that has a grab bar. Keep electrical cords out of the way. Do not use floor polish or wax that makes floors slippery. If you must use wax, use non-skid floor wax. Do not have throw rugs and other things on the floor that can make you trip. What can I do with my stairs? Do not leave any items on the stairs. Make sure that there are handrails on both sides of the stairs and use them. Fix handrails that are broken or loose. Make sure that handrails are as long as the stairways. Check any carpeting to make sure that it is firmly attached to the stairs. Fix any carpet that is loose or worn. Avoid having throw rugs at the top or bottom of the stairs. If you  do have throw rugs, attach them to the floor with carpet tape. Make sure that you have a light switch at the top of the stairs and the bottom of the stairs. If you do not have them, ask someone to add them for you. What else can I do to help prevent falls? Wear shoes that: Do not have high heels. Have rubber bottoms. Are comfortable and fit you well. Are closed at the toe. Do not wear sandals. If you use a stepladder: Make sure that it is fully opened. Do not climb a closed stepladder. Make sure that both sides of the stepladder are locked into place. Ask someone to hold it for you, if possible. Clearly mark and make sure that you can see: Any grab bars or  handrails. First and last steps. Where the edge of each step is. Use tools that help you move around (mobility aids) if they are needed. These include: Canes. Walkers. Scooters. Crutches. Turn on the lights when you go into a dark area. Replace any light bulbs as soon as they burn out. Set up your furniture so you have a clear path. Avoid moving your furniture around. If any of your floors are uneven, fix them. If there are any pets around you, be aware of where they are. Review your medicines with your doctor. Some medicines can make you feel dizzy. This can increase your chance of falling. Ask your doctor what other things that you can do to help prevent falls. This information is not intended to replace advice given to you by your health care provider. Make sure you discuss any questions you have with your health care provider. Document Released: 05/27/2009 Document Revised: 01/06/2016 Document Reviewed: 09/04/2014 Elsevier Interactive Patient Education  2017 [employer].

## 2022-05-02 NOTE — Progress Notes (Addendum)
Virtual Visit via Telephone Note  I connected with  Linda Hayes on 05/02/22 at  3:30 PM EDT by telephone and verified that I am speaking with the correct person using two identifiers.  Location: Patient: Home Provider: [place] Persons participating in the virtual visit: [place]   I discussed the limitations, risks, security and privacy concerns of performing an evaluation and management service by telephone and the availability of in person appointments. The patient expressed understanding and agreed to proceed.  Interactive audio and video telecommunications were attempted between this nurse and patient, however failed, due to patient having technical difficulties OR patient did not have access to video capability.  We continued and completed visit with audio only.  Some vital signs may be absent or patient reported.   Sheral Flow, LPN  Subjective:   Linda Hayes is a 71 y.o. female who presents for Medicare Annual (Subsequent) preventive examination.  Review of Systems     Cardiac Risk Factors include: advanced age (>26mn, >>47women);dyslipidemia;family history of premature cardiovascular disease;sedentary lifestyle;obesity (BMI >30kg/m2)     Objective:    Today's Vitals   05/02/22 1537  Weight: 184 lb (83.5 kg)  Height: '5\' 4"'$  (1.626 m)  PainSc: 0-No pain   Body mass index is 31.58 kg/m.     05/02/2022    3:10 PM 10/19/2016    8:17 AM 05/17/2015   11:08 AM  Advanced Directives  Does Patient Have a Medical Advance Directive? No Yes;No No  Does patient want to make changes to medical advance directive?  Yes (ED - Information included in AVS)   Would patient like information on creating a medical advance directive? No - Patient declined  Yes - Educational materials given    Current Medications (verified) Outpatient Encounter Medications as of 05/02/2022  Medication Sig   alprazolam (XANAX) 2 MG tablet Take 1 tablet (2 mg total)  by mouth 3 (three) times daily as needed for anxiety.   Cholecalciferol (VITAMIN D3) 50 MCG (2000 UT) capsule Take 1 capsule (2,000 Units total) by mouth daily.   EPINEPHrine (EPIPEN 2-PAK) 0.3 mg/0.3 mL IJ SOAJ injection Inject 0.3 mg into the muscle as needed for anaphylaxis.   sertraline (ZOLOFT) 100 MG tablet Take 2 tablets (200 mg total) by mouth daily.   SYNTHROID 125 MCG tablet TAKE 1 TABLET BY MOUTH EVERY DAY   zolpidem (AMBIEN CR) 6.25 MG CR tablet 1 po qhs prn   estradiol (ESTRACE VAGINAL) 0.1 MG/GM vaginal cream Place 1 Applicatorful vaginally at bedtime. (Patient not taking: Reported on 05/02/2022)   potassium chloride SA (KLOR-CON M) 20 MEQ tablet Take 1 tablet (20 mEq total) by mouth daily. (Patient not taking: Reported on 04/03/2022)   No facility-administered encounter medications on file as of 05/02/2022.    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    Cataract    forming both eyes    Colitis due to Clostridioides difficile 2007   DEPRESSION 03/17/2008   GAIT ATAXIA 06/11/2009   GERD (gastroesophageal reflux disease)    diet related    Hyperlipidemia    HYPOTHYROIDISM 03/17/2008   INSOMNIA, PERSISTENT 03/17/2008   Meige syndrome (blepharospasm with oromandibular dystonia)    OBESITY 03/17/2008   Palpitations 10/01/2008   SWEATING  03/17/2008   Past Surgical History:  Procedure Laterality Date   COLONOSCOPY     THYROIDECTOMY  1999   TONSILLECTOMY     Family History  Problem Relation Age of Onset   Other Mother        injury related to a fall   Prostate cancer Father    Colon polyps Sister    Hypertension Other    Depression Other    Diabetes Other    Colon cancer Neg Hx    Esophageal  cancer Neg Hx    Stomach cancer Neg Hx    Liver disease Neg Hx    Pancreatic cancer Neg Hx    Ulcerative colitis Neg Hx    Social History   Socioeconomic History   Marital status: Married    Spouse name: Not on file   Number of children: 3   Years of education: 12   Highest education level: High school graduate  Occupational History   Occupation: Disabled  Tobacco Use   Smoking status: Never   Smokeless tobacco: Never  Substance and Sexual Activity   Alcohol use: Not Currently    Alcohol/week: 0.0 standard drinks of alcohol   Drug use: No   Sexual activity: Yes  Other Topics Concern   Not on file  Social History Narrative   Married, 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   Right-handed.    No daily use of caffeine.      Social Determinants of Health   Financial Resource Strain: Low Risk  (05/02/2022)   Overall Financial Resource Strain (CARDIA)    Difficulty of Paying Living Expenses: Not hard at all  Food Insecurity: No Food Insecurity (05/02/2022)   Hunger Vital Sign    Worried About Running Out of Food in the Last Year: Never true    Ran Out of Food in the Last Year: Never true  Transportation Needs: No Transportation Needs (05/02/2022)   PRAPARE - Hydrologist (Medical): No    Lack of Transportation (Non-Medical): No  Physical Activity: Inactive (05/02/2022)   Exercise Vital Sign    Days of Exercise per Week: 0 days    Minutes of Exercise per Session: 0 min  Stress: No Stress Concern Present (05/02/2022)   [place]    Feeling of Stress : Not at all  Social Connections: [place] (05/02/2022)   Social Connection and Isolation Panel [NHANES]    Frequency of Communication with Friends and Family: More than three times a week    Frequency of Social Gatherings with Friends and Family: More than three times a week     Attends Religious Services: More than 4 times per year    Active Member of [employer] or Organizations: Yes    Attends Music therapist: More than 4 times per year    Marital Status: Married    Tobacco Counseling Counseling given: Not Answered   Clinical Intake:  Pre-visit preparation completed: Yes  Pain : No/denies pain Pain Score: 0-No pain     BMI - recorded: 31.58 Nutritional Status: BMI > 30  Obese Nutritional Risks: None Diabetes: No  How often do you need to have someone help you when you read instructions, pamphlets, or other written materials from your doctor or pharmacy?: 1 - Never What is the last grade level you completed in school?: HSG  Diabetic? no  Interpreter Needed?: No  Information entered by :: Lisette Abu, LPN.   Activities of Daily Living    05/02/2022    3:41 PM  In your present state of health, do you have any difficulty performing the following activities:  Hearing? 0  Vision? 1  Difficulty concentrating or making decisions? 0  Walking or climbing stairs? 0  Dressing or bathing? 0  Doing errands, shopping? 1  Preparing Food and eating ? N  Using the Toilet? N  In the past six months, have you accidently leaked urine? N  Do you have problems with loss of bowel control? N  Managing your Medications? N  Managing your Finances? N  Housekeeping or managing your Housekeeping? Y    Patient Care Team: Cassandria Anger, MD as PCP - General Carlean Purl Ofilia Neas, MD as Attending Physician (Gastroenterology) Sheralyn Boatman, MD as Consulting Physician (Psychiatry) Heron Nay (Inactive) as Referring Physician Marcial Pacas, MD as Consulting Physician (Neurology) Marygrace Drought, MD as Consulting Physician (Ophthalmology) Marcial Pacas, MD as Consulting Physician (Neurology)  Indicate any recent Medical Services you may have received from other than [facility] providers in the past year (date may be approximate).     Assessment:    This is a routine wellness examination for [place].  Hearing/Vision screen Hearing Screening - Comments:: Denies hearing difficulties.  Vision Screening - Comments:: Patient is partially blind. Wears eyeglasses and eye exams done by: Marygrace Drought, MD.  Dietary issues and exercise activities discussed: Current Exercise Habits: The patient does not participate in regular exercise at present, Exercise limited by: psychological condition(s);orthopedic condition(s)   Goals Addressed             This Visit's Progress    Continue to do the best that I can.        Depression Screen    05/02/2022    3:38 PM 04/03/2022    2:02 PM 01/27/2022    9:12 AM 01/22/2018    8:09 AM 10/19/2016    8:20 AM  PHQ 2/9 Scores  PHQ - 2 Score '2 2 6 1 1  '$ PHQ- 9 Score '3 3 17      '$ Fall Risk    05/02/2022    3:41 PM 01/27/2022    8:28 AM 01/18/2021   10:35 AM 07/09/2019   10:43 AM 03/15/2018    4:55 PM  Fall Risk   Falls in the past year? '1 1 1 '$ 0 No  Comment    [employer] Telephone Survey: data to providers prior to load [employer] Telephone Survey: data to providers prior to load  Number falls in past yr: 1 1 0    Injury with Fall? 1 0 0    Risk for fall due to : History of fall(s);Impaired balance/gait;Orthopedic patient History of fall(s) No Fall Risks    Follow up Education provided;Falls prevention discussed Falls evaluation completed       FALL RISK PREVENTION PERTAINING TO THE HOME:  Any stairs in or around the home? No  If so, are there any without handrails? No  Home free of loose throw rugs in walkways, pet beds, electrical cords, etc? Yes  Adequate lighting in your home to reduce risk of falls? Yes   ASSISTIVE DEVICES UTILIZED TO PREVENT FALLS:  Life alert? No  Use of a cane, walker or w/c? No  Grab bars in the bathroom? No  Shower chair or bench in shower? Yes  Elevated toilet seat or a handicapped toilet? No   TIMED UP AND TM:HDQQI VISIT  Was  the test performed? No .   Cognitive Function:         05/02/2022    3:42 PM  6CIT Screen  What Year? 0 points  What month? 0 points  What time? 0 points  Count back from 20 0 points  Months in reverse 0 points  Repeat phrase 0 points  Total Score 0 points    Immunizations Immunization History  Administered Date(s) Administered   Fluad Quad(high Dose 65+) 07/19/2020   Influenza Whole 04/28/2010   Influenza, High Dose Seasonal PF 05/23/2017, 05/18/2019, 07/21/2021   Influenza,inj,Quad PF,6+ Mos 06/12/2015, 05/23/2016, 06/03/2018   Influenza-Unspecified 05/14/2012, 05/14/2013   PFIZER(Purple Top)SARS-COV-2 Vaccination 10/09/2019, 11/10/2019   Pneumococcal Polysaccharide-23 01/22/2018   Zoster, Live 03/31/2014    TDAP status: declined  Flu Vaccine status: Due, Education has been provided regarding the importance of this vaccine. Advised may receive this vaccine at local pharmacy or Health Dept. Aware to provide a copy of the vaccination record if obtained from local pharmacy or Health Dept. Verbalized acceptance and understanding.  Pneumococcal vaccine status: Declined,  Education has been provided regarding the importance of this vaccine but patient still declined. Advised may receive this vaccine at local pharmacy or Health Dept. Aware to provide a copy of the vaccination record if obtained from local pharmacy or Health Dept. Verbalized acceptance and understanding.   Covid-19 vaccine status: Completed vaccines  Qualifies for Shingles Vaccine? Yes   Zostavax completed Yes   Shingrix Completed?: No.    Education has been provided regarding the importance of this vaccine. Patient has been advised to call insurance company to determine out of pocket expense if they have not yet received this vaccine. Advised may also receive vaccine at local pharmacy or Health Dept. Verbalized acceptance and understanding.  Screening Tests Health Maintenance  Topic Date Due   Hepatitis C Screening  Never done   DEXA SCAN  Never done    MAMMOGRAM  02/01/2018   Pneumonia Vaccine 66+ Years old (2 - PCV) 01/23/2019   COVID-19 Vaccine (3 - Pfizer series) 01/05/2020   INFLUENZA VACCINE  03/14/2022   COLONOSCOPY (Pts 45-63yr Insurance coverage will need to be confirmed)  03/22/2030   HPV VACCINES  Aged Out   TETANUS/TDAP  Discontinued   Fecal DNA (Cologuard)  Discontinued   Zoster Vaccines- Shingrix  Discontinued    Health Maintenance  Health Maintenance Due  Topic Date Due   Hepatitis C Screening  Never done   DEXA SCAN  Never done   MAMMOGRAM  02/01/2018   Pneumonia Vaccine 70 Years old (2 - PCV) 01/23/2019   COVID-19 Vaccine (3 - Pfizer series) 01/05/2020   INFLUENZA VACCINE  03/14/2022    Colorectal cancer screening: Type of screening: Colonoscopy. Completed 03/22/2020. Repeat every 5 years  Mammogram status: No longer required due to age.  Bone density status: never done  Lung Cancer Screening: (Low Dose CT Chest recommended if Age 70-80years, 30 pack-year currently smoking OR have quit w/in 15years.) does not qualify.   Lung Cancer Screening Referral: no  Additional Screening:  Hepatitis C Screening: does qualify; Completed no  Vision Screening: Recommended annual ophthalmology exams for early detection of glaucoma and other disorders of the eye. Is the patient up to date with their annual eye exam?  Yes  Who is the provider or what is the name of the office in which the patient attends annual eye exams? MMarygrace Drought MD. If pt is not established with a provider, would they like to  be referred to a provider to establish care? No .   Dental Screening: Recommended annual dental exams for proper oral hygiene  Community Resource Referral / Chronic Care Management: CRR required this visit?  No   CCM required this visit?  No      Plan:     I have personally reviewed and noted the following in the patient's chart:   Medical and social history Use of alcohol, tobacco or illicit drugs  Current  medications and supplements including opioid prescriptions. Patient is not currently taking opioid prescriptions. Functional ability and status Nutritional status Physical activity Advanced directives List of other physicians Hospitalizations, surgeries, and ER visits in previous 12 months Vitals Screenings to include cognitive, depression, and falls Referrals and appointments  In addition, I have reviewed and discussed with patient certain preventive protocols, quality metrics, and best practice recommendations. A written personalized care plan for preventive services as well as general preventive health recommendations were provided to patient.     Sheral Flow, LPN   [id number]   Nurse Notes: N/A    Medical screening examination/treatment/procedure(s) were performed by non-physician practitioner and as supervising physician I was immediately available for consultation/collaboration.  I agree with above. Lew Dawes, MD

## 2022-07-29 ENCOUNTER — Other Ambulatory Visit: Payer: Self-pay | Admitting: Internal Medicine

## 2022-08-02 ENCOUNTER — Ambulatory Visit: Payer: Medicare Other | Admitting: Internal Medicine

## 2022-08-24 ENCOUNTER — Ambulatory Visit: Payer: Medicare Other | Admitting: Internal Medicine

## 2022-08-24 ENCOUNTER — Ambulatory Visit: Payer: Medicare Other

## 2022-09-19 ENCOUNTER — Encounter: Payer: Self-pay | Admitting: Internal Medicine

## 2022-09-19 NOTE — Telephone Encounter (Signed)
Is it ok to change to virtual../lmb

## 2022-09-27 ENCOUNTER — Other Ambulatory Visit: Payer: Self-pay | Admitting: Internal Medicine

## 2022-10-04 ENCOUNTER — Telehealth (INDEPENDENT_AMBULATORY_CARE_PROVIDER_SITE_OTHER): Payer: Medicare Other | Admitting: Internal Medicine

## 2022-10-04 ENCOUNTER — Encounter: Payer: Self-pay | Admitting: Internal Medicine

## 2022-10-04 DIAGNOSIS — F5104 Psychophysiologic insomnia: Secondary | ICD-10-CM | POA: Diagnosis not present

## 2022-10-04 DIAGNOSIS — E890 Postprocedural hypothyroidism: Secondary | ICD-10-CM | POA: Diagnosis not present

## 2022-10-04 DIAGNOSIS — G245 Blepharospasm: Secondary | ICD-10-CM | POA: Diagnosis not present

## 2022-10-04 DIAGNOSIS — F419 Anxiety disorder, unspecified: Secondary | ICD-10-CM | POA: Diagnosis not present

## 2022-10-04 MED ORDER — ALPRAZOLAM 2 MG PO TABS: 2.0000 mg | ORAL_TABLET | Freq: Three times a day (TID) | ORAL | 1 refills | Status: DC | PRN

## 2022-10-04 MED ORDER — ZOLPIDEM TARTRATE ER 6.25 MG PO TBCR: EXTENDED_RELEASE_TABLET | ORAL | 1 refills | Status: DC

## 2022-10-04 NOTE — Progress Notes (Signed)
Virtual Visit via Video Note  I connected with Linda Hayes on 10/04/22 at 10:40 AM EST by a video enabled telemedicine application and verified that I am speaking with the correct person using two identifiers.   I discussed the limitations of evaluation and management by telemedicine and the availability of in person appointments. The patient expressed understanding and agreed to proceed.  I was located at our [facility] office. The patient was at home. There was no one else present in the visit. Poor connection.  No chief complaint on file.    History of Present Illness:  Follow-up on anxiety, insomnia, blepharospasm, tremor, hypothyroidism  Review of Systems  Constitutional:  Negative for weight loss.  HENT:  Negative for congestion and sore throat.   Eyes:  Negative for blurred vision and double vision.  Respiratory:  Negative for cough.   Cardiovascular:  Negative for chest pain and palpitations.  Gastrointestinal:  Negative for nausea.  Skin:  Negative for rash.  Neurological:  Positive for tremors. Negative for dizziness and weakness.  Psychiatric/Behavioral:  Positive for depression. Negative for memory loss and suicidal ideas. The patient is nervous/anxious and has insomnia.      Observations/Objective: The patient appears to be in no acute distress, head tremor and blepharospasm is noted  Assessment and Plan:  Problem List Items Addressed This Visit       Endocrine   Hypothyroidism    Cont w/ Levothroid        Nervous and Auditory   Blepharospasm - Primary (Chronic)    Stable symptoms.  Follow-up with Dr. Krista Blue        Other   Insomnia disorder    Cont w/Zolpidem at hs  Potential benefits of a long term benzodiazepines  use as well as potential risks  and complications were explained to the patient and were aknowledged.      Anxiety disorder    Chronic. Stable.  There is no psychotic features.  Continue on Xanax  2 mg 3 times daily.  Continue on  Zoloft  200 mg daily.  Potential benefits of a long term benzodiazepines  use as well as potential risks  and complications were explained to the patient and were aknowledged.      Relevant Medications   alprazolam (XANAX) 2 MG tablet     Meds ordered this encounter  Medications   alprazolam (XANAX) 2 MG tablet    Sig: Take 1 tablet (2 mg total) by mouth 3 (three) times daily as needed for anxiety.    Dispense:  270 tablet    Refill:  1    This request is for a new prescription for a controlled substance as required by Federal/State law..   zolpidem (AMBIEN CR) 6.25 MG CR tablet    Sig: 1 po qhs prn    Dispense:  90 tablet    Refill:  1    Not to exceed 4 additional fills before 01/14/2022.     Follow Up Instructions:    I discussed the assessment and treatment plan with the patient. The patient was provided an opportunity to ask questions and all were answered. The patient agreed with the plan and demonstrated an understanding of the instructions.   The patient was advised to call back or seek an in-person evaluation if the symptoms worsen or if the condition fails to improve as anticipated.  I provided face-to-face time during this encounter. We were at different locations.   Walker Kehr, MD

## 2022-10-04 NOTE — Assessment & Plan Note (Signed)
Cont w/Zolpidem at hs  Potential benefits of a long term benzodiazepines  use as well as potential risks  and complications were explained to the patient and were aknowledged.

## 2022-10-04 NOTE — Assessment & Plan Note (Signed)
Stable symptoms.  Follow-up with Dr. Krista Blue

## 2022-10-04 NOTE — Assessment & Plan Note (Signed)
Cont w/ Levothroid

## 2022-10-04 NOTE — Assessment & Plan Note (Addendum)
Chronic. Stable.  There is no psychotic features.  Continue on Xanax  2 mg 3 times daily.  Continue on Zoloft  200 mg daily.  Potential benefits of a long term benzodiazepines  use as well as potential risks  and complications were explained to the patient and were aknowledged.

## 2022-12-19 ENCOUNTER — Ambulatory Visit: Payer: Medicare Other | Admitting: Internal Medicine

## 2022-12-21 ENCOUNTER — Ambulatory Visit (INDEPENDENT_AMBULATORY_CARE_PROVIDER_SITE_OTHER): Payer: Medicare Other | Admitting: Internal Medicine

## 2022-12-21 ENCOUNTER — Encounter: Payer: Self-pay | Admitting: Internal Medicine

## 2022-12-21 VITALS — BP 128/76 | HR 89 | Temp 97.8°F | Ht 64.0 in | Wt 185.0 lb

## 2022-12-21 DIAGNOSIS — E66811 Obesity, class 1: Secondary | ICD-10-CM

## 2022-12-21 DIAGNOSIS — F419 Anxiety disorder, unspecified: Secondary | ICD-10-CM

## 2022-12-21 DIAGNOSIS — E6609 Other obesity due to excess calories: Secondary | ICD-10-CM | POA: Diagnosis not present

## 2022-12-21 DIAGNOSIS — R202 Paresthesia of skin: Secondary | ICD-10-CM | POA: Diagnosis not present

## 2022-12-21 DIAGNOSIS — E890 Postprocedural hypothyroidism: Secondary | ICD-10-CM

## 2022-12-21 DIAGNOSIS — E559 Vitamin D deficiency, unspecified: Secondary | ICD-10-CM

## 2022-12-21 DIAGNOSIS — R269 Unspecified abnormalities of gait and mobility: Secondary | ICD-10-CM | POA: Diagnosis not present

## 2022-12-21 DIAGNOSIS — Z6831 Body mass index (BMI) 31.0-31.9, adult: Secondary | ICD-10-CM | POA: Diagnosis not present

## 2022-12-21 LAB — COMPREHENSIVE METABOLIC PANEL
ALT: 13 U/L (ref 0–35)
AST: 18 U/L (ref 0–37)
Albumin: 4.3 g/dL (ref 3.5–5.2)
Alkaline Phosphatase: 65 U/L (ref 39–117)
BUN: 19 mg/dL (ref 6–23)
CO2: 25 mEq/L (ref 19–32)
Calcium: 9.4 mg/dL (ref 8.4–10.5)
Chloride: 106 mEq/L (ref 96–112)
Creatinine, Ser: 0.94 mg/dL (ref 0.40–1.20)
GFR: 61.34 mL/min (ref >60.00)
Glucose, Bld: 96 mg/dL (ref 70–99)
Potassium: 4.3 mEq/L (ref 3.5–5.1)
Sodium: 140 mEq/L (ref 135–145)
Total Bilirubin: 0.5 mg/dL (ref 0.2–1.2)
Total Protein: 7.7 g/dL (ref 6.0–8.3)

## 2022-12-21 LAB — CBC WITH DIFFERENTIAL/PLATELET
Basophils Absolute: 0.1 10*3/uL (ref 0.0–0.1)
Basophils Relative: 0.7 % (ref 0.0–3.0)
Eosinophils Absolute: 0.2 10*3/uL (ref 0.0–0.7)
Eosinophils Relative: 2.2 % (ref 0.0–5.0)
HCT: 43.9 % (ref 36.0–46.0)
Hemoglobin: 15.2 g/dL — ABNORMAL HIGH (ref 12.0–15.0)
Lymphocytes Relative: 39.8 % (ref 12.0–46.0)
Lymphs Abs: 4 10*3/uL (ref 0.7–4.0)
MCHC: 34.6 g/dL (ref 30.0–36.0)
MCV: 94.3 fl (ref 78.0–100.0)
Monocytes Absolute: 0.7 10*3/uL (ref 0.1–1.0)
Monocytes Relative: 6.7 % (ref 3.0–12.0)
Neutro Abs: 5.1 10*3/uL (ref 1.4–7.7)
Neutrophils Relative %: 50.6 % (ref 43.0–77.0)
Platelets: 308 10*3/uL (ref 150.0–400.0)
RBC: 4.66 Mil/uL (ref 3.87–5.11)
RDW: 13.5 % (ref 11.5–15.5)
WBC: 10 10*3/uL (ref 4.0–10.5)

## 2022-12-21 LAB — TSH: TSH: 0.59 u[IU]/mL (ref 0.35–5.50)

## 2022-12-21 LAB — URINALYSIS, ROUTINE W REFLEX MICROSCOPIC
Bilirubin Urine: NEGATIVE
Hgb urine dipstick: NEGATIVE
Ketones, ur: NEGATIVE
Nitrite: POSITIVE — AB
RBC / HPF: NONE SEEN (ref 0–?)
Specific Gravity, Urine: <=1.005 — AB (ref 1.000–1.030)
Total Protein, Urine: NEGATIVE
Urine Glucose: NEGATIVE
Urobilinogen, UA: 0.2 (ref 0.0–1.0)
pH: 6.5 (ref 5.0–8.0)

## 2022-12-21 LAB — VITAMIN B12: Vitamin B-12: 261 pg/mL (ref 211–911)

## 2022-12-21 LAB — VITAMIN D 25 HYDROXY (VIT D DEFICIENCY, FRACTURES): VITD: 24.72 ng/mL — ABNORMAL LOW (ref 30.00–100.00)

## 2022-12-21 MED ORDER — CENTRUM SILVER ADULT 50+ PO TABS: ORAL_TABLET | ORAL | 3 refills | Status: AC

## 2022-12-21 NOTE — Assessment & Plan Note (Signed)
Use a cane for walking

## 2022-12-21 NOTE — Assessment & Plan Note (Addendum)
Wt Readings from Last 3 Encounters:  12/21/22 185 lb (83.9 kg)  05/02/22 184 lb (83.5 kg)  04/03/22 184 lb (83.5 kg)   On diet

## 2022-12-21 NOTE — Progress Notes (Signed)
Subjective:  Patient ID: Linda Hayes, female    DOB: 05-23-1951  Age: 72 y.o. MRN: [id number]  CC: No chief complaint on file.   HPI JEANI JARROW presents for anxiety, tremor, insomnia  Outpatient Medications Prior to Visit  Medication Sig Dispense Refill   alprazolam (XANAX) 2 MG tablet Take 1 tablet (2 mg total) by mouth 3 (three) times daily as needed for anxiety. 270 tablet 1   Cholecalciferol (VITAMIN D3) 50 MCG (2000 UT) capsule Take 1 capsule (2,000 Units total) by mouth daily. 100 capsule 3   EPINEPHrine (EPIPEN 2-PAK) 0.3 mg/0.3 mL IJ SOAJ injection Inject 0.3 mg into the muscle as needed for anaphylaxis. 1 each 1   sertraline (ZOLOFT) 100 MG tablet TAKE 2 TABLETS BY MOUTH EVERY DAY 180 tablet 1   SYNTHROID 125 MCG tablet TAKE 1 TABLET BY MOUTH EVERY DAY 90 tablet 3   zolpidem (AMBIEN CR) 6.25 MG CR tablet 1 po qhs prn 90 tablet 1   estradiol (ESTRACE VAGINAL) 0.1 MG/GM vaginal cream Place 1 Applicatorful vaginally at bedtime. (Patient not taking: Reported on 05/02/2022) 42.5 g 5   potassium chloride SA (KLOR-CON M) 20 MEQ tablet Take 1 tablet (20 mEq total) by mouth daily. (Patient not taking: Reported on 04/03/2022) 90 tablet 3   No facility-administered medications prior to visit.    ROS: Review of Systems  Constitutional:  Positive for fatigue. Negative for activity change, appetite change, chills and unexpected weight change.  HENT:  Negative for congestion, mouth sores and sinus pressure.   Eyes:  Positive for visual disturbance.  Respiratory:  Negative for cough and chest tightness.   Gastrointestinal:  Negative for abdominal pain and nausea.  Genitourinary:  Negative for difficulty urinating, frequency and vaginal pain.  Musculoskeletal:  Positive for arthralgias and gait problem. Negative for back pain.  Skin:  Negative for pallor and rash.  Neurological:  Positive for dizziness, tremors and weakness. Negative for numbness and headaches.  Hematological:  Does  not bruise/bleed easily.  Psychiatric/Behavioral:  Negative for confusion, sleep disturbance and suicidal ideas. The patient is nervous/anxious.     Objective:  BP 128/76 (BP Location: Left Arm, Patient Position: Sitting, Cuff Size: Normal)   Pulse 89   Temp 97.8 F (36.6 C) (Oral)   Ht 5\' 4"  (1.626 m)   Wt 185 lb (83.9 kg)   SpO2 95%   BMI 31.76 kg/m   BP Readings from Last 3 Encounters:  12/21/22 128/76  04/03/22 130/76  03/15/22 (!) 100/52    Wt Readings from Last 3 Encounters:  12/21/22 185 lb (83.9 kg)  05/02/22 184 lb (83.5 kg)  04/03/22 184 lb (83.5 kg)    Physical Exam Constitutional:      General: She is not in acute distress.    Appearance: She is well-developed. She is obese.  HENT:     Head: Normocephalic.     Right Ear: External ear normal.     Left Ear: External ear normal.     Nose: Nose normal.  Eyes:     General:        Right eye: No discharge.        Left eye: No discharge.     Conjunctiva/sclera: Conjunctivae normal.     Pupils: Pupils are equal, round, and reactive to light.  Neck:     Thyroid: No thyromegaly.     Vascular: No JVD.     Trachea: No tracheal deviation.  Cardiovascular:     Rate  and Rhythm: Normal rate and regular rhythm.     Heart sounds: Normal heart sounds.  Pulmonary:     Effort: No respiratory distress.     Breath sounds: No stridor. No wheezing.  Abdominal:     General: Bowel sounds are normal. There is no distension.     Palpations: Abdomen is soft. There is no mass.     Tenderness: There is no abdominal tenderness. There is no guarding or rebound.  Musculoskeletal:        General: No tenderness.     Cervical back: Normal range of motion and neck supple. No rigidity.     Right lower leg: No edema.     Left lower leg: No edema.  Lymphadenopathy:     Cervical: No cervical adenopathy.  Skin:    Findings: No erythema or rash.  Neurological:     Mental Status: She is oriented to person, place, and time.     Cranial  Nerves: No cranial nerve deficit.     Motor: No abnormal muscle tone.     Coordination: Coordination abnormal.     Gait: Gait abnormal.     Deep Tendon Reflexes: Reflexes normal.  Psychiatric:        Behavior: Behavior normal.        Thought Content: Thought content normal.        Judgment: Judgment normal.   Tics, tremor  Lab Results  Component Value Date   WBC 10.0 12/21/2022   HGB 15.2 (H) 12/21/2022   HCT 43.9 12/21/2022   PLT 308.0 12/21/2022   GLUCOSE 96 12/21/2022   CHOL 235 (H) 01/31/2022   TRIG 267.0 (H) 01/31/2022   HDL 35.30 (L) 01/31/2022   LDLDIRECT 156.0 01/31/2022   ALT 13 12/21/2022   AST 18 12/21/2022   NA 140 12/21/2022   K 4.3 12/21/2022   CL 106 12/21/2022   CREATININE 0.94 12/21/2022   BUN 19 12/21/2022   CO2 25 12/21/2022   TSH 0.59 12/21/2022    No results found.  Assessment & Plan:   Problem List Items Addressed This Visit     Hypothyroidism    Cont w/ Levothroid      Relevant Orders   CBC with Differential/Platelet (Completed)   Comprehensive metabolic panel (Completed)   OBESITY    Wt Readings from Last 3 Encounters:  12/21/22 185 lb (83.9 kg)  05/02/22 184 lb (83.5 kg)  04/03/22 184 lb (83.5 kg)  On diet      Anxiety disorder - Primary    Stable.  There is no psychotic features.  Continue on Xanax  2 mg 3 times daily.  Continue on Zoloft  200 mg daily.  Potential benefits of a long term benzodiazepines  use as well as potential risks  and complications were explained to the patient and were aknowledged.      Relevant Orders   TSH (Completed)   Urinalysis   CBC with Differential/Platelet (Completed)   GAIT ATAXIA    Use a cane for walking      Relevant Orders   CBC with Differential/Platelet (Completed)   Other Visit Diagnoses     Vitamin D deficiency       Relevant Orders   VITAMIN D 25 Hydroxy (Vit-D Deficiency, Fractures) (Completed)   Paresthesia       Relevant Orders   TSH (Completed)   Vitamin B12  (Completed)         Meds ordered this encounter  Medications   Multiple Vitamins-Minerals (  CENTRUM SILVER ADULT 50+) TABS    Sig: 1 po qd    Dispense:  100 tablet    Refill:  3      Follow-up: Return in about 6 months (around 06/23/2023) for a follow-up visit.  Sonda Primes, MD

## 2022-12-21 NOTE — Assessment & Plan Note (Signed)
Stable.  There is no psychotic features.  Continue on Xanax  2 mg 3 times daily.  Continue on Zoloft  200 mg daily.  Potential benefits of a long term benzodiazepines  use as well as potential risks  and complications were explained to the patient and were aknowledged.

## 2022-12-21 NOTE — Assessment & Plan Note (Signed)
Cont w/ Levothroid 

## 2022-12-23 ENCOUNTER — Other Ambulatory Visit: Payer: Self-pay | Admitting: Internal Medicine

## 2022-12-23 ENCOUNTER — Encounter: Payer: Self-pay | Admitting: Internal Medicine

## 2022-12-23 MED ORDER — VITAMIN D (ERGOCALCIFEROL) 1.25 MG (50000 UNIT) PO CAPS: 50000.0000 [IU] | ORAL_CAPSULE | ORAL | 0 refills | Status: DC

## 2022-12-23 MED ORDER — CEFUROXIME AXETIL 250 MG PO TABS: 250.0000 mg | ORAL_TABLET | Freq: Two times a day (BID) | ORAL | 0 refills | Status: DC

## 2023-01-04 ENCOUNTER — Encounter: Payer: Self-pay | Admitting: Internal Medicine

## 2023-01-04 DIAGNOSIS — N3000 Acute cystitis without hematuria: Secondary | ICD-10-CM

## 2023-01-09 ENCOUNTER — Encounter: Payer: Self-pay | Admitting: Internal Medicine

## 2023-01-09 ENCOUNTER — Other Ambulatory Visit (INDEPENDENT_AMBULATORY_CARE_PROVIDER_SITE_OTHER): Payer: Medicare Other

## 2023-01-09 DIAGNOSIS — N3000 Acute cystitis without hematuria: Secondary | ICD-10-CM | POA: Diagnosis not present

## 2023-01-09 LAB — URINALYSIS, ROUTINE W REFLEX MICROSCOPIC
Bilirubin Urine: NEGATIVE
Ketones, ur: NEGATIVE
Nitrite: NEGATIVE
Specific Gravity, Urine: 1.02 (ref 1.000–1.030)
Total Protein, Urine: NEGATIVE
Urine Glucose: NEGATIVE
Urobilinogen, UA: 0.2 (ref 0.0–1.0)
pH: 6 (ref 5.0–8.0)

## 2023-01-11 ENCOUNTER — Other Ambulatory Visit: Payer: Self-pay | Admitting: Internal Medicine

## 2023-01-22 ENCOUNTER — Encounter: Payer: Self-pay | Admitting: Internal Medicine

## 2023-01-26 ENCOUNTER — Other Ambulatory Visit: Payer: Self-pay | Admitting: Internal Medicine

## 2023-01-26 MED ORDER — ARIPIPRAZOLE 2 MG PO TABS: 2.0000 mg | ORAL_TABLET | Freq: Every day | ORAL | 5 refills | Status: DC

## 2023-04-10 ENCOUNTER — Encounter: Payer: Self-pay | Admitting: Internal Medicine

## 2023-04-13 ENCOUNTER — Other Ambulatory Visit: Payer: Self-pay | Admitting: Internal Medicine

## 2023-04-15 ENCOUNTER — Encounter: Payer: Self-pay | Admitting: Internal Medicine

## 2023-04-17 ENCOUNTER — Other Ambulatory Visit: Payer: Self-pay | Admitting: Internal Medicine

## 2023-04-18 ENCOUNTER — Other Ambulatory Visit: Payer: Self-pay | Admitting: Family Medicine

## 2023-04-18 MED ORDER — ZOLPIDEM TARTRATE ER 6.25 MG PO TBCR: EXTENDED_RELEASE_TABLET | ORAL | 0 refills | Status: DC

## 2023-04-18 NOTE — Telephone Encounter (Signed)
Patient called to check on the status of her refill request. Best callback is [phone].

## 2023-04-22 ENCOUNTER — Encounter: Payer: Self-pay | Admitting: Internal Medicine

## 2023-05-08 ENCOUNTER — Other Ambulatory Visit: Payer: Self-pay | Admitting: Internal Medicine

## 2023-05-10 ENCOUNTER — Encounter: Payer: Self-pay | Admitting: Internal Medicine

## 2023-05-13 ENCOUNTER — Encounter: Payer: Self-pay | Admitting: Internal Medicine

## 2023-05-13 NOTE — Progress Notes (Deleted)
    Subjective:    Patient ID: Linda Hayes, female    DOB: [birth year], 72 y.o.   MRN: [id number]      HPI [place] is here for No chief complaint on file.        Medications and allergies reviewed with patient and updated if appropriate.  Current Outpatient Medications on File Prior to Visit  Medication Sig Dispense Refill   alprazolam (XANAX) 2 MG tablet TAKE 1 TABLET (2 MG TOTAL) BY MOUTH 3 (THREE) TIMES DAILY AS NEEDED FOR ANXIETY. 270 tablet 0   cefUROXime (CEFTIN) 250 MG tablet Take 1 tablet (250 mg total) by mouth 2 (two) times daily with a meal. 14 tablet 0   Cholecalciferol (VITAMIN D3) 50 MCG (2000 UT) capsule Take 1 capsule (2,000 Units total) by mouth daily. 100 capsule 3   EPINEPHrine (EPIPEN 2-PAK) 0.3 mg/0.3 mL IJ SOAJ injection Inject 0.3 mg into the muscle as needed for anaphylaxis. 1 each 1   estradiol (ESTRACE VAGINAL) 0.1 MG/GM vaginal cream Place 1 Applicatorful vaginally at bedtime. (Patient not taking: Reported on 05/02/2022) 42.5 g 5   Multiple Vitamins-Minerals (CENTRUM SILVER ADULT 50+) TABS 1 po qd 100 tablet 3   potassium chloride SA (KLOR-CON M) 20 MEQ tablet Take 1 tablet (20 mEq total) by mouth daily. (Patient not taking: Reported on 04/03/2022) 90 tablet 3   sertraline (ZOLOFT) 100 MG tablet TAKE 2 TABLETS BY MOUTH EVERY DAY 180 tablet 1   SYNTHROID 125 MCG tablet TAKE 1 TABLET BY MOUTH EVERY DAY 90 tablet 3   Vitamin D, Ergocalciferol, (DRISDOL) 1.25 MG (50000 UNIT) CAPS capsule Take 1 capsule (50,000 Units total) by mouth every 7 (seven) days. 6 capsule 0   zolpidem (AMBIEN CR) 6.25 MG CR tablet 1 po qhs prn 10 tablet 0   No current facility-administered medications on file prior to visit.    Review of Systems     Objective:  There were no vitals filed for this visit. BP Readings from Last 3 Encounters:  12/21/22 128/76  04/03/22 130/76  03/15/22 (!) 100/52   Wt Readings from Last 3 Encounters:  12/21/22 185 lb (83.9 kg)  05/02/22 184 lb  (83.5 kg)  04/03/22 184 lb (83.5 kg)   There is no height or weight on file to calculate BMI.    Physical Exam         Assessment & Plan:    See Problem List for Assessment and Plan of chronic medical problems.

## 2023-05-14 ENCOUNTER — Ambulatory Visit: Payer: Medicare Other | Admitting: Internal Medicine

## 2023-07-04 ENCOUNTER — Encounter: Payer: Self-pay | Admitting: Internal Medicine

## 2023-07-04 ENCOUNTER — Ambulatory Visit: Payer: Medicare Other | Admitting: Internal Medicine

## 2023-07-10 ENCOUNTER — Encounter: Payer: Self-pay | Admitting: Internal Medicine

## 2023-07-10 ENCOUNTER — Ambulatory Visit: Payer: Medicare Other | Admitting: Internal Medicine

## 2023-07-10 VITALS — BP 118/70 | HR 84 | Temp 98.6°F | Ht 64.0 in | Wt 186.0 lb

## 2023-07-10 DIAGNOSIS — F419 Anxiety disorder, unspecified: Secondary | ICD-10-CM | POA: Diagnosis not present

## 2023-07-10 DIAGNOSIS — Z9181 History of falling: Secondary | ICD-10-CM | POA: Insufficient documentation

## 2023-07-10 DIAGNOSIS — N390 Urinary tract infection, site not specified: Secondary | ICD-10-CM

## 2023-07-10 DIAGNOSIS — R109 Unspecified abdominal pain: Secondary | ICD-10-CM | POA: Insufficient documentation

## 2023-07-10 DIAGNOSIS — R1030 Lower abdominal pain, unspecified: Secondary | ICD-10-CM | POA: Diagnosis not present

## 2023-07-10 DIAGNOSIS — F4323 Adjustment disorder with mixed anxiety and depressed mood: Secondary | ICD-10-CM | POA: Diagnosis not present

## 2023-07-10 DIAGNOSIS — Z23 Encounter for immunization: Secondary | ICD-10-CM

## 2023-07-10 LAB — CBC WITH DIFFERENTIAL/PLATELET
Basophils Absolute: 0.1 K/uL (ref 0.0–0.1)
Basophils Relative: 0.8 % (ref 0.0–3.0)
Eosinophils Absolute: 0.2 K/uL (ref 0.0–0.7)
Eosinophils Relative: 1.7 % (ref 0.0–5.0)
HCT: 43.8 % (ref 36.0–46.0)
Hemoglobin: 14.8 g/dL (ref 12.0–15.0)
Lymphocytes Relative: 36 % (ref 12.0–46.0)
Lymphs Abs: 3.7 K/uL (ref 0.7–4.0)
MCHC: 33.8 g/dL (ref 30.0–36.0)
MCV: 97.4 fl (ref 78.0–100.0)
Monocytes Absolute: 0.6 K/uL (ref 0.1–1.0)
Monocytes Relative: 6.3 % (ref 3.0–12.0)
Neutro Abs: 5.6 K/uL (ref 1.4–7.7)
Neutrophils Relative %: 55.2 % (ref 43.0–77.0)
Platelets: 269 K/uL (ref 150.0–400.0)
RBC: 4.5 Mil/uL (ref 3.87–5.11)
RDW: 13.8 % (ref 11.5–15.5)
WBC: 10.2 K/uL (ref 4.0–10.5)

## 2023-07-10 LAB — COMPREHENSIVE METABOLIC PANEL WITH GFR
ALT: 12 U/L (ref 0–35)
AST: 18 U/L (ref 0–37)
Albumin: 4.2 g/dL (ref 3.5–5.2)
Alkaline Phosphatase: 63 U/L (ref 39–117)
BUN: 17 mg/dL (ref 6–23)
CO2: 28 meq/L (ref 19–32)
Calcium: 9.7 mg/dL (ref 8.4–10.5)
Chloride: 104 meq/L (ref 96–112)
Creatinine, Ser: 0.97 mg/dL (ref 0.40–1.20)
GFR: 58.85 mL/min — ABNORMAL LOW
Glucose, Bld: 91 mg/dL (ref 70–99)
Potassium: 4.1 meq/L (ref 3.5–5.1)
Sodium: 141 meq/L (ref 135–145)
Total Bilirubin: 0.6 mg/dL (ref 0.2–1.2)
Total Protein: 7.4 g/dL (ref 6.0–8.3)

## 2023-07-10 LAB — URINALYSIS, ROUTINE W REFLEX MICROSCOPIC
Bilirubin Urine: NEGATIVE
Ketones, ur: NEGATIVE
Nitrite: NEGATIVE
Specific Gravity, Urine: 1.015 (ref 1.000–1.030)
Total Protein, Urine: 30 — AB
Urine Glucose: NEGATIVE
Urobilinogen, UA: 0.2 (ref 0.0–1.0)
pH: 6 (ref 5.0–8.0)

## 2023-07-10 NOTE — Assessment & Plan Note (Signed)
Better with Zoloft 200 mg daily

## 2023-07-10 NOTE — Assessment & Plan Note (Signed)
Get a rollator walker PT offered

## 2023-07-10 NOTE — Assessment & Plan Note (Signed)
Check UA

## 2023-07-10 NOTE — Progress Notes (Signed)
Subjective:  Patient ID: Linda Hayes, female    DOB: 1951/01/11  Age: 72 y.o. MRN: [id number]  CC: Medical Management of Chronic Issues (6 mnth f/u, discuss referral to women's health for bladder dropping would like to see Dr. Wyvonnia Lora if she is still practicing.)   HPI Linda Hayes presents for a possible bladder drop x several months Here w/Stephanie, her dtr F/u anxiety, insomnia, tremors  Outpatient Medications Prior to Visit  Medication Sig Dispense Refill   alprazolam (XANAX) 2 MG tablet TAKE 1 TABLET (2 MG TOTAL) BY MOUTH 3 (THREE) TIMES DAILY AS NEEDED FOR ANXIETY. 270 tablet 0   Cholecalciferol (VITAMIN D3) 50 MCG (2000 UT) capsule Take 1 capsule (2,000 Units total) by mouth daily. 100 capsule 3   EPINEPHrine (EPIPEN 2-PAK) 0.3 mg/0.3 mL IJ SOAJ injection Inject 0.3 mg into the muscle as needed for anaphylaxis. 1 each 1   Multiple Vitamins-Minerals (CENTRUM SILVER ADULT 50+) TABS 1 po qd 100 tablet 3   sertraline (ZOLOFT) 100 MG tablet TAKE 2 TABLETS BY MOUTH EVERY DAY 180 tablet 1   SYNTHROID 125 MCG tablet TAKE 1 TABLET BY MOUTH EVERY DAY 90 tablet 3   Vitamin D, Ergocalciferol, (DRISDOL) 1.25 MG (50000 UNIT) CAPS capsule Take 1 capsule (50,000 Units total) by mouth every 7 (seven) days. 6 capsule 0   cefUROXime (CEFTIN) 250 MG tablet Take 1 tablet (250 mg total) by mouth 2 (two) times daily with a meal. 14 tablet 0   estradiol (ESTRACE VAGINAL) 0.1 MG/GM vaginal cream Place 1 Applicatorful vaginally at bedtime. (Patient not taking: Reported on 05/02/2022) 42.5 g 5   potassium chloride SA (KLOR-CON M) 20 MEQ tablet Take 1 tablet (20 mEq total) by mouth daily. (Patient not taking: Reported on 04/03/2022) 90 tablet 3   zolpidem (AMBIEN CR) 6.25 MG CR tablet 1 po qhs prn (Patient not taking: Reported on 07/10/2023) 10 tablet 0   No facility-administered medications prior to visit.    ROS: Review of Systems  Constitutional:  Negative for activity change, appetite change,  chills, fatigue and unexpected weight change.  HENT:  Negative for congestion, mouth sores and sinus pressure.   Eyes:  Negative for visual disturbance.  Respiratory:  Negative for cough and chest tightness.   Gastrointestinal:  Positive for abdominal pain. Negative for abdominal distention, diarrhea, nausea and vomiting.  Genitourinary:  Positive for urgency. Negative for difficulty urinating, frequency, vaginal discharge and vaginal pain.  Musculoskeletal:  Negative for back pain and gait problem.  Skin:  Negative for pallor and rash.  Neurological:  Positive for tremors. Negative for dizziness, syncope, weakness, light-headedness, numbness and headaches.  Psychiatric/Behavioral:  Negative for confusion, sleep disturbance and suicidal ideas. The patient is nervous/anxious.     Objective:  BP 118/70 (BP Location: Right Arm, Patient Position: Sitting, Cuff Size: Normal)   Pulse 84   Temp 98.6 F (37 C) (Oral)   Ht 5\' 4"  (1.626 m)   Wt 186 lb (84.4 kg)   SpO2 96%   BMI 31.93 kg/m   BP Readings from Last 3 Encounters:  07/10/23 118/70  12/21/22 128/76  04/03/22 130/76    Wt Readings from Last 3 Encounters:  07/10/23 186 lb (84.4 kg)  12/21/22 185 lb (83.9 kg)  05/02/22 184 lb (83.5 kg)    Physical Exam Constitutional:      General: She is not in acute distress.    Appearance: She is well-developed. She is obese.  HENT:  Head: Normocephalic.     Right Ear: External ear normal.     Left Ear: External ear normal.     Nose: Nose normal.  Eyes:     General:        Right eye: No discharge.        Left eye: No discharge.     Conjunctiva/sclera: Conjunctivae normal.     Pupils: Pupils are equal, round, and reactive to light.  Neck:     Thyroid: No thyromegaly.     Vascular: No JVD.     Trachea: No tracheal deviation.  Cardiovascular:     Rate and Rhythm: Normal rate and regular rhythm.     Heart sounds: Normal heart sounds.  Pulmonary:     Effort: No respiratory  distress.     Breath sounds: No stridor. No wheezing.  Abdominal:     General: Bowel sounds are normal. There is no distension.     Palpations: Abdomen is soft. There is no mass.     Tenderness: There is no abdominal tenderness. There is no guarding or rebound.  Musculoskeletal:        General: No tenderness.     Cervical back: Normal range of motion and neck supple. No rigidity.     Right lower leg: No edema.     Left lower leg: No edema.  Lymphadenopathy:     Cervical: No cervical adenopathy.  Skin:    Findings: No erythema or rash.  Neurological:     Mental Status: She is oriented to person, place, and time.     Cranial Nerves: No cranial nerve deficit.     Motor: No abnormal muscle tone.     Coordination: Coordination normal.     Gait: Gait normal.     Deep Tendon Reflexes: Reflexes normal.  Psychiatric:        Behavior: Behavior normal.        Thought Content: Thought content normal.        Judgment: Judgment normal.   Unsteady gait Tremors Tics, ataxia    A total time of 45 minutes was spent preparing to see the patient, reviewing tests, x-rays, operative reports and other medical records.  Also, obtaining history and performing comprehensive physical exam.  Additionally, counseling the patient regarding the above listed issues.   Finally, documenting clinical information in the health records, coordination of care, educating the patient - fall prevention.  Lab Results  Component Value Date   WBC 10.0 12/21/2022   HGB 15.2 (H) 12/21/2022   HCT 43.9 12/21/2022   PLT 308.0 12/21/2022   GLUCOSE 96 12/21/2022   CHOL 235 (H) 01/31/2022   TRIG 267.0 (H) 01/31/2022   HDL 35.30 (L) 01/31/2022   LDLDIRECT 156.0 01/31/2022   ALT 13 12/21/2022   AST 18 12/21/2022   NA 140 12/21/2022   K 4.3 12/21/2022   CL 106 12/21/2022   CREATININE 0.94 12/21/2022   BUN 19 12/21/2022   CO2 25 12/21/2022   TSH 0.59 12/21/2022    No results found.  Assessment & Plan:   Problem  List Items Addressed This Visit     Anxiety disorder    Stable.  There is no psychotic features.  Continue on Xanax  2 mg 3 times daily.  Continue on Zoloft  200 mg daily.  Potential benefits of a long term benzodiazepines  use as well as potential risks  and complications were explained to the patient and were aknowledged.      RESOLVED:  Adjustment disorder with mixed anxiety and depressed mood - Primary    Better with Zoloft 200 mg daily      Urinary tract infection    Check UA      Abdominal pain    Lower abd pain - new Ceck CBC, CMET, UA Get a CT abd/pelvis      Relevant Orders   CBC with Differential/Platelet   Comprehensive metabolic panel   Urinalysis   CT ABDOMEN PELVIS W CONTRAST   At high risk for falls    Get a rollator walker PT offered         No orders of the defined types were placed in this encounter.     Follow-up: Return in about 6 weeks (around 08/21/2023) for a follow-up visit.  Sonda Primes, MD

## 2023-07-10 NOTE — Assessment & Plan Note (Signed)
Stable.  There is no psychotic features.  Continue on Xanax  2 mg 3 times daily.  Continue on Zoloft  200 mg daily.  Potential benefits of a long term benzodiazepines  use as well as potential risks  and complications were explained to the patient and were aknowledged.

## 2023-07-10 NOTE — Assessment & Plan Note (Addendum)
Lower abd pain - new Ceck CBC, CMET, UA Get a CT abd/pelvis

## 2023-07-11 ENCOUNTER — Encounter: Payer: Self-pay | Admitting: Internal Medicine

## 2023-07-12 MED ORDER — CEPHALEXIN 500 MG PO CAPS: 500.0000 mg | ORAL_CAPSULE | Freq: Four times a day (QID) | ORAL | 0 refills | Status: DC

## 2023-07-12 NOTE — Addendum Note (Signed)
Addended by: Tresa Garter on: 07/12/2023 01:03 PM   Modules accepted: Orders, Level of Service

## 2023-07-14 ENCOUNTER — Other Ambulatory Visit: Payer: Self-pay | Admitting: Internal Medicine

## 2023-07-16 ENCOUNTER — Encounter: Payer: Self-pay | Admitting: Radiology

## 2023-08-13 ENCOUNTER — Inpatient Hospital Stay
Admission: RE | Admit: 2023-08-13 | Discharge: 2023-08-13 | Disposition: A | Discharge status: Home / Self Care | Payer: Medicare Other | Source: Ambulatory Visit | Attending: Internal Medicine | Admitting: Internal Medicine

## 2023-08-13 DIAGNOSIS — G8929 Other chronic pain: Secondary | ICD-10-CM | POA: Diagnosis not present

## 2023-08-13 DIAGNOSIS — R9349 Abnormal radiologic findings on diagnostic imaging of other urinary organs: Secondary | ICD-10-CM | POA: Diagnosis not present

## 2023-08-13 DIAGNOSIS — R1030 Lower abdominal pain, unspecified: Secondary | ICD-10-CM

## 2023-08-13 DIAGNOSIS — I700 Atherosclerosis of aorta: Secondary | ICD-10-CM | POA: Diagnosis not present

## 2023-08-13 DIAGNOSIS — K760 Fatty (change of) liver, not elsewhere classified: Secondary | ICD-10-CM | POA: Diagnosis not present

## 2023-08-13 MED ORDER — IOPAMIDOL (ISOVUE-300) INJECTION 61%
100.0000 mL | Freq: Once | INTRAVENOUS | Status: AC | PRN
  Administered 2023-08-13: 100 mL via INTRAVENOUS

## 2023-08-16 ENCOUNTER — Ambulatory Visit: Payer: Medicare Other | Admitting: Internal Medicine

## 2023-08-16 ENCOUNTER — Encounter: Payer: Self-pay | Admitting: Internal Medicine

## 2023-08-16 VITALS — BP 110/68 | HR 71 | Temp 97.6°F | Ht 64.0 in

## 2023-08-16 DIAGNOSIS — G245 Blepharospasm: Secondary | ICD-10-CM | POA: Diagnosis not present

## 2023-08-16 DIAGNOSIS — N390 Urinary tract infection, site not specified: Secondary | ICD-10-CM

## 2023-08-16 DIAGNOSIS — E890 Postprocedural hypothyroidism: Secondary | ICD-10-CM

## 2023-08-16 DIAGNOSIS — F419 Anxiety disorder, unspecified: Secondary | ICD-10-CM | POA: Diagnosis not present

## 2023-08-16 LAB — COMPREHENSIVE METABOLIC PANEL
ALT: 14 U/L (ref 0–35)
AST: 25 U/L (ref 0–37)
Albumin: 4.3 g/dL (ref 3.5–5.2)
Alkaline Phosphatase: 55 U/L (ref 39–117)
BUN: 18 mg/dL (ref 6–23)
CO2: 26 meq/L (ref 19–32)
Calcium: 9.2 mg/dL (ref 8.4–10.5)
Chloride: 102 meq/L (ref 96–112)
Creatinine, Ser: 0.92 mg/dL (ref 0.40–1.20)
GFR: 62.66 mL/min (ref >60.00)
Glucose, Bld: 84 mg/dL (ref 70–99)
Potassium: 4.1 meq/L (ref 3.5–5.1)
Sodium: 137 meq/L (ref 135–145)
Total Bilirubin: 0.5 mg/dL (ref 0.2–1.2)
Total Protein: 7.3 g/dL (ref 6.0–8.3)

## 2023-08-16 LAB — CBC WITH DIFFERENTIAL/PLATELET
Basophils Absolute: 0.1 10*3/uL (ref 0.0–0.1)
Basophils Relative: 0.9 % (ref 0.0–3.0)
Eosinophils Absolute: 0.1 10*3/uL (ref 0.0–0.7)
Eosinophils Relative: 1.6 % (ref 0.0–5.0)
HCT: 42.6 % (ref 36.0–46.0)
Hemoglobin: 14.4 g/dL (ref 12.0–15.0)
Lymphocytes Relative: 37.9 % (ref 12.0–46.0)
Lymphs Abs: 2.8 10*3/uL (ref 0.7–4.0)
MCHC: 33.7 g/dL (ref 30.0–36.0)
MCV: 96.9 fL (ref 78.0–100.0)
Monocytes Absolute: 0.5 10*3/uL (ref 0.1–1.0)
Monocytes Relative: 6.6 % (ref 3.0–12.0)
Neutro Abs: 3.9 10*3/uL (ref 1.4–7.7)
Neutrophils Relative %: 53 % (ref 43.0–77.0)
Platelets: 260 10*3/uL (ref 150.0–400.0)
RBC: 4.39 Mil/uL (ref 3.87–5.11)
RDW: 13.6 % (ref 11.5–15.5)
WBC: 7.4 10*3/uL (ref 4.0–10.5)

## 2023-08-16 LAB — URINALYSIS, ROUTINE W REFLEX MICROSCOPIC
Bilirubin Urine: NEGATIVE
Ketones, ur: NEGATIVE
Nitrite: NEGATIVE
Specific Gravity, Urine: 1.025 (ref 1.000–1.030)
Total Protein, Urine: 30 — AB
Urine Glucose: NEGATIVE
Urobilinogen, UA: 0.2 (ref 0.0–1.0)
pH: 6 (ref 5.0–8.0)

## 2023-08-16 LAB — T4, FREE: Free T4: 1.02 ng/dL (ref 0.60–1.60)

## 2023-08-16 MED ORDER — CEPHALEXIN 500 MG PO CAPS
500.0000 mg | ORAL_CAPSULE | Freq: Four times a day (QID) | ORAL | 2 refills | Status: DC
Start: 2023-08-16 — End: 2023-12-03

## 2023-08-16 NOTE — Progress Notes (Signed)
 Subjective:  Patient ID: Linda Hayes, female    DOB: 1950-10-16  Age: 73 y.o. MRN: [id number]  CC: Medical Management of Chronic Issues (6 week f/u, Dysuria./Discuss assisted living facilities.)   HPI Linda Hayes presents for anxiety, depression, tremors, abd pain Pt had a CT done 3 d ago C/o UTI Here w/dtr  Outpatient Medications Prior to Visit  Medication Sig Dispense Refill   Cholecalciferol (VITAMIN D3) 50 MCG (2000 UT) capsule Take 1 capsule (2,000 Units total) by mouth daily. 100 capsule 3   EPINEPHrine  (EPIPEN  2-PAK) 0.3 mg/0.3 mL IJ SOAJ injection Inject 0.3 mg into the muscle as needed for anaphylaxis. 1 each 1   Multiple Vitamins-Minerals (CENTRUM SILVER  ADULT 50+) TABS 1 po qd 100 tablet 3   sertraline  (ZOLOFT ) 100 MG tablet TAKE 2 TABLETS BY MOUTH EVERY DAY 180 tablet 1   SYNTHROID  125 MCG tablet TAKE 1 TABLET BY MOUTH EVERY DAY 90 tablet 3   Vitamin D , Ergocalciferol , (DRISDOL ) 1.25 MG (50000 UNIT) CAPS capsule Take 1 capsule (50,000 Units total) by mouth every 7 (seven) days. 6 capsule 0   alprazolam  (XANAX ) 2 MG tablet TAKE 1 TABLET (2 MG TOTAL) BY MOUTH 3 (THREE) TIMES DAILY AS NEEDED FOR ANXIETY. 270 tablet 0   estradiol  (ESTRACE  VAGINAL) 0.1 MG/GM vaginal cream Place 1 Applicatorful vaginally at bedtime. (Patient not taking: Reported on 08/16/2023) 42.5 g 5   potassium chloride  SA (KLOR-CON  M) 20 MEQ tablet Take 1 tablet (20 mEq total) by mouth daily. (Patient not taking: Reported on 08/16/2023) 90 tablet 3   cephALEXin  (KEFLEX ) 500 MG capsule Take 1 capsule (500 mg total) by mouth 4 (four) times daily. (Patient not taking: Reported on 08/16/2023) 20 capsule 0   No facility-administered medications prior to visit.    ROS: Review of Systems  Constitutional:  Positive for fatigue. Negative for activity change, appetite change, chills and unexpected weight change.  HENT:  Negative for congestion, mouth sores and sinus pressure.   Eyes:  Negative for visual  disturbance.  Respiratory:  Negative for cough and chest tightness.   Gastrointestinal:  Negative for abdominal pain and nausea.  Genitourinary:  Negative for difficulty urinating, frequency and vaginal pain.  Musculoskeletal:  Positive for arthralgias. Negative for back pain and gait problem.  Skin:  Negative for pallor and rash.  Neurological:  Negative for dizziness, tremors, weakness, numbness and headaches.  Hematological:  Does not bruise/bleed easily.  Psychiatric/Behavioral:  Positive for decreased concentration. Negative for confusion, sleep disturbance and suicidal ideas. The patient is nervous/anxious.     Objective:  BP 110/68 (BP Location: Left Arm, Patient Position: Sitting, Cuff Size: Normal)   Pulse 71   Temp 97.6 F (36.4 C) (Oral)   Ht 5' 4 (1.626 m)   SpO2 98%   BMI 31.93 kg/m   BP Readings from Last 3 Encounters:  08/16/23 110/68  07/10/23 118/70  12/21/22 128/76    Wt Readings from Last 3 Encounters:  07/10/23 186 lb (84.4 kg)  12/21/22 185 lb (83.9 kg)  05/02/22 184 lb (83.5 kg)    Physical Exam Constitutional:      General: She is not in acute distress.    Appearance: She is well-developed. She is obese.  HENT:     Head: Normocephalic.     Right Ear: External ear normal.     Left Ear: External ear normal.     Nose: Nose normal.  Eyes:     General:  Right eye: No discharge.        Left eye: No discharge.     Conjunctiva/sclera: Conjunctivae normal.     Pupils: Pupils are equal, round, and reactive to light.  Neck:     Thyroid : No thyromegaly.     Vascular: No JVD.     Trachea: No tracheal deviation.  Cardiovascular:     Rate and Rhythm: Normal rate and regular rhythm.     Heart sounds: Normal heart sounds.  Pulmonary:     Effort: No respiratory distress.     Breath sounds: No stridor. No wheezing.  Abdominal:     General: Bowel sounds are normal. There is no distension.     Palpations: Abdomen is soft. There is no mass.      Tenderness: There is no abdominal tenderness. There is no guarding or rebound.  Musculoskeletal:        General: No swelling or tenderness.     Cervical back: Normal range of motion and neck supple. No rigidity.  Lymphadenopathy:     Cervical: No cervical adenopathy.  Skin:    Findings: No erythema or rash.  Neurological:     Mental Status: She is oriented to person, place, and time.     Cranial Nerves: No cranial nerve deficit.     Motor: Weakness present. No abnormal muscle tone.     Coordination: Coordination abnormal.     Gait: Gait abnormal.     Deep Tendon Reflexes: Reflexes normal.  Psychiatric:        Behavior: Behavior normal.        Thought Content: Thought content normal.        Judgment: Judgment normal.   Tremors, blepharospasm  Lab Results  Component Value Date   WBC 7.4 08/16/2023   HGB 14.4 08/16/2023   HCT 42.6 08/16/2023   PLT 260.0 08/16/2023   GLUCOSE 84 08/16/2023   CHOL 235 (H) 01/31/2022   TRIG 267.0 (H) 01/31/2022   HDL 35.30 (L) 01/31/2022   LDLDIRECT 156.0 01/31/2022   ALT 14 08/16/2023   AST 25 08/16/2023   NA 137 08/16/2023   K 4.1 08/16/2023   CL 102 08/16/2023   CREATININE 0.92 08/16/2023   BUN 18 08/16/2023   CO2 26 08/16/2023   TSH 0.59 12/21/2022    No results found.  Assessment & Plan:   Problem List Items Addressed This Visit     Blepharospasm (Chronic)   Stable symptoms.  Follow-up with Dr. Onita      Hypothyroidism   Relevant Orders   CBC with Differential/Platelet (Completed)   T4, free (Completed)   Anxiety disorder   Stable.  There is no psychotic features.  Continue on Xanax   2 mg 3 times daily.  Continue on Zoloft   200 mg daily.  Potential benefits of a long term benzodiazepines  use as well as potential risks  and complications were explained to the patient and were aknowledged.      UTI (urinary tract infection) - Primary   Relevant Medications   cephALEXin  (KEFLEX ) 500 MG capsule   Other Relevant Orders    Urinalysis   CULTURE, URINE COMPREHENSIVE   CBC with Differential/Platelet (Completed)   Comprehensive metabolic panel (Completed)   T4, free (Completed)   Urinalysis   CULTURE, URINE COMPREHENSIVE (Completed)      Meds ordered this encounter  Medications   cephALEXin  (KEFLEX ) 500 MG capsule    Sig: Take 1 capsule (500 mg total) by mouth 4 (four) times daily.  Dispense:  28 capsule    Refill:  2      Follow-up: Return in about 3 months (around 11/14/2023) for a follow-up visit.  Marolyn Noel, MD

## 2023-08-18 LAB — CULTURE, URINE COMPREHENSIVE

## 2023-08-21 ENCOUNTER — Encounter: Payer: Self-pay | Admitting: Internal Medicine

## 2023-08-26 ENCOUNTER — Other Ambulatory Visit: Payer: Self-pay | Admitting: Internal Medicine

## 2023-08-28 ENCOUNTER — Other Ambulatory Visit: Payer: Self-pay | Admitting: Internal Medicine

## 2023-08-28 MED ORDER — ALPRAZOLAM 2 MG PO TABS: 2.0000 mg | ORAL_TABLET | Freq: Three times a day (TID) | ORAL | 1 refills | Status: DC | PRN

## 2023-08-28 NOTE — Assessment & Plan Note (Signed)
 Stable.  There is no psychotic features.  Continue on Xanax  2 mg 3 times daily.  Continue on Zoloft  200 mg daily.  Potential benefits of a long term benzodiazepines  use as well as potential risks  and complications were explained to the patient and were aknowledged.

## 2023-08-28 NOTE — Assessment & Plan Note (Signed)
Stable symptoms.  Follow-up with Dr. Krista Blue

## 2023-09-01 ENCOUNTER — Encounter: Payer: Self-pay | Admitting: Internal Medicine

## 2023-10-18 ENCOUNTER — Other Ambulatory Visit: Payer: Self-pay | Admitting: Internal Medicine

## 2023-11-29 ENCOUNTER — Ambulatory Visit

## 2023-12-03 ENCOUNTER — Ambulatory Visit: Payer: Self-pay

## 2023-12-03 ENCOUNTER — Encounter: Payer: Self-pay | Admitting: Family Medicine

## 2023-12-03 ENCOUNTER — Ambulatory Visit: Admitting: Family Medicine

## 2023-12-03 VITALS — BP 100/58 | HR 56 | Temp 97.8°F | Resp 18 | Ht 64.0 in | Wt 162.0 lb

## 2023-12-03 DIAGNOSIS — N3001 Acute cystitis with hematuria: Secondary | ICD-10-CM | POA: Diagnosis not present

## 2023-12-03 DIAGNOSIS — F331 Major depressive disorder, recurrent, moderate: Secondary | ICD-10-CM | POA: Diagnosis not present

## 2023-12-03 DIAGNOSIS — Z889 Allergy status to unspecified drugs, medicaments and biological substances status: Secondary | ICD-10-CM

## 2023-12-03 LAB — POCT URINALYSIS DIPSTICK
Blood, UA: POSITIVE
Glucose, UA: NEGATIVE
Nitrite, UA: POSITIVE
Protein, UA: POSITIVE — AB
Spec Grav, UA: <=1.005 — AB (ref 1.010–1.025)
Urobilinogen, UA: 0.2 U/dL
pH, UA: 6 (ref 5.0–8.0)

## 2023-12-03 MED ORDER — EPINEPHRINE 0.3 MG/0.3ML IJ SOAJ: 0.3000 mg | INTRAMUSCULAR | 0 refills | Status: AC | PRN

## 2023-12-03 MED ORDER — CEFDINIR 300 MG PO CAPS: 300.0000 mg | ORAL_CAPSULE | Freq: Two times a day (BID) | ORAL | 0 refills | Status: AC

## 2023-12-03 NOTE — Telephone Encounter (Signed)
 Reason for Disposition . [1] SEVERE pelvic pain AND [2] present > 1 hour  Protocols used: Pelvic Pain - Female-A-AH

## 2023-12-03 NOTE — Progress Notes (Signed)
 Assessment & Plan:  1. Acute cystitis with hematuria (Primary) Education provided on UTIs. Encouraged adequate hydration.  - POCT urinalysis dipstick - Urine Culture; Future - cefdinir  (OMNICEF ) 300 MG capsule; Take 1 capsule (300 mg total) by mouth 2 (two) times daily for 7 days.  Dispense: 14 capsule; Refill: 0 - Urine Culture  2. Moderate recurrent major depression (HCC) GeneSight testing completed.   3. History of allergic reaction Patient in need of an EpiPen  refill as she lost hers in a recent move.  - EPINEPHrine  (EPIPEN  2-PAK) 0.3 mg/0.3 mL IJ SOAJ injection; Inject 0.3 mg into the muscle as needed for anaphylaxis.  Dispense: 2 each; Refill: 0   Follow up plan: Return if symptoms worsen or fail to improve.  Hershel Los, MSN, APRN, FNP-C  Subjective:  HPI: Linda Hayes is a 73 y.o. female presenting on 12/03/2023 for Urinary Tract Infection (Burning, pain, and frequency, decreased output, dark in color - started a couple of weeks ago. ) and Depression (Felling very down and drained since husband passed away in 09-07-2023. /On zoloft  every day and xanax  PRN )  Patient is accompanied by her daughter and son-in-law, who she is okay with being present.   Patient complains of dysuria, frequency, and decreased urine output . She has had symptoms for a few weeks. Patient also complains of  dark color to her urine . Patient denies fever. Patient does have a history of recurrent UTI.  Patient does not have a history of pyelonephritis.   Patient also reports worsening depression since her husband passed away in Sep 07, 2023. She has been on her current regimen for years which is Zoloft  200 mg daily and Xanax  2 mg TID PRN. She does report she generally breaks the 2 mg tablet in half. These are the only medications she has ever taken for depression.      12/03/2023    3:17 PM 07/10/2023   10:14 AM 12/21/2022    9:50 AM  Depression screen PHQ 2/9  Decreased Interest 3 0 0  Down, Depressed,  Hopeless 3 0   PHQ - 2 Score 6 0 0  Altered sleeping 0    Tired, decreased energy 3    Change in appetite 3    Feeling bad or failure about yourself  1    Trouble concentrating 0    Moving slowly or fidgety/restless 0    Suicidal thoughts 1    PHQ-9 Score 14    Difficult doing work/chores Very difficult       ROS: Negative unless specifically indicated above in HPI.   Relevant past medical history reviewed and updated as indicated.   Allergies and medications reviewed and updated.   Current Outpatient Medications:    alprazolam  (XANAX ) 2 MG tablet, Take 1 tablet (2 mg total) by mouth 3 (three) times daily as needed for anxiety., Disp: 270 tablet, Rfl: 1   Cholecalciferol (VITAMIN D3) 50 MCG (2000 UT) capsule, Take 1 capsule (2,000 Units total) by mouth daily., Disp: 100 capsule, Rfl: 3   Multiple Vitamins-Minerals (CENTRUM SILVER  ADULT 50+) TABS, 1 po qd, Disp: 100 tablet, Rfl: 3   sertraline  (ZOLOFT ) 100 MG tablet, TAKE 2 TABLETS BY MOUTH EVERY DAY, Disp: 180 tablet, Rfl: 1   SYNTHROID  125 MCG tablet, TAKE 1 TABLET BY MOUTH EVERY DAY, Disp: 90 tablet, Rfl: 3   EPINEPHrine  (EPIPEN  2-PAK) 0.3 mg/0.3 mL IJ SOAJ injection, Inject 0.3 mg into the muscle as needed for anaphylaxis. (Patient not taking: Reported on  12/03/2023), Disp: 1 each, Rfl: 1  Allergies  Allergen Reactions   Macrobid  [Nitrofurantoin ]    Ambien  [Zolpidem ]     Memory, balance issues   Ciprofloxacin     Caused colitis   Erythromycin     Intolerance - GI   Gabapentin      cough   Hydrocodone  Nausea And Vomiting   Penicillins Other (See Comments)    Rash. Pt can take cephalosporins ok.   Tramadol      n/v   Ritalin  [Methylphenidate] Palpitations    Objective:   BP (!) 100/58   Pulse (!) 56   Temp 97.8 F (36.6 C)   Resp 18   Ht 5\' 4"  (1.626 m)   Wt 162 lb (73.5 kg)   BMI 27.81 kg/m    Physical Exam Vitals reviewed.  Constitutional:      General: She is not in acute distress.    Appearance:  Normal appearance. She is not ill-appearing, toxic-appearing or diaphoretic.  HENT:     Head: Normocephalic and atraumatic.  Eyes:     General: No scleral icterus.       Right eye: No discharge.        Left eye: No discharge.     Conjunctiva/sclera: Conjunctivae normal.  Cardiovascular:     Rate and Rhythm: Normal rate.  Pulmonary:     Effort: Pulmonary effort is normal. No respiratory distress.  Musculoskeletal:        General: Normal range of motion.     Cervical back: Normal range of motion.  Skin:    General: Skin is warm and dry.     Capillary Refill: Capillary refill takes less than 2 seconds.  Neurological:     General: No focal deficit present.     Mental Status: She is alert and oriented to person, place, and time. Mental status is at baseline.  Psychiatric:        Mood and Affect: Mood normal.        Behavior: Behavior normal.        Thought Content: Thought content normal.        Judgment: Judgment normal.

## 2023-12-03 NOTE — Telephone Encounter (Signed)
 Patient's daughter, Edwina Gram, called in on behalf of the patient. Edwina Gram was calling to schedule patient an appointment in the office today or tomorrow. Edwina Gram is not listed on DPR. Patient was triaged earlier today and advised ED. This RN did not disclose that information to [place]. This RN advised Edwina Gram that I would call the patient directly to discuss scheduling. This RN called patient. Patient answered and stated she has been experiencing the same symptoms that she was triaged for this morning. Patient stated her PCP is aware of her symptoms, as they have been occurring on and off for years. This RN called [facility] to maintain patient care and gain approval for scheduling. This RN spoke to [place] at [facility] who stated patient could be seen today by a different provider in the office. This RN attempted to conference patient in for scheduling, but patient had already hung up. This RN attempted to call patient back to schedule, but no answer. Routing to office for scheduling. Please advise.  Reason for Disposition . Requesting regular office appointment  Protocols used: Information Only Call - No Triage-A-AH

## 2023-12-03 NOTE — Telephone Encounter (Signed)
 Chief Complaint: bladder pain Symptoms: frequency, urgency,  Frequency: since last week Pertinent Negatives: Patient denies fever, N/V, hematuria, vaginal bleeding, back pain Disposition: [x] ED /[] Urgent Care (no appt availability in office) / [] Appointment(In office/virtual)/ []  [facility]/ [] Home Care/ [x] Refused Recommended Disposition /[] [facility]/ []  Follow-up with PCP Additional Notes: Pt reports severe bladder pain. States she has a hx of UTIs. Pt states she has been under a great deal of stress recently d/t her husband's recent passing. Pt endorses frequency, urgency, and dysuria. Pt also reports severe pain in her vagina. Pt very tearful. Pt there is a raw area next to her rectum that is extremely painful and has not healed. Pt denies fever, vaginal bleeding, hematuria, N/V, back pain. Pt also reports she has a neurological disease and reports recent falls. Pt denies dizziness or difficulty walking but states sometimes her legs get very weak. RN advised pt to go to the ED for her symptoms. Pt declined. Pt states she would rather be seen in the office and that she gets UTIs frequently and has been treated with an antibiotic in the past. RN advised pt RN would relay concern to the office for follow-up.    Copied from CRM [id number]. Topic: Clinical - Red Word Triage >> Dec 03, 2023  9:30 AM Freya Jesus wrote: Red Word that prompted transfer to Nurse Triage: Patient stated she's experiencing pain in her bladder. Reason for Disposition  [1] SEVERE pelvic pain (e.g., excruciating) AND [2] vomiting  Answer Assessment - Initial Assessment Questions 1. LOCATION: "Where does it hurt?"      "Right at my vagina" 2. RADIATION: "Does the pain shoot anywhere else?" (e.g., lower back, groin, thighs)     No 3. ONSET: "When did the pain begin?" (e.g., minutes, hours or days ago)      "Started last week sometime" 4. SUDDEN: "Gradual or sudden onset?"     Gradual  5. PATTERN "Does  the pain come and go, or is it constant?"    - If constant: "Is it getting better, staying the same, or worsening?"      (Note: Constant means the pain never goes away completely; most serious pain is constant and gets worse over time)     - If intermittent: "How long does it last?" "Do you have pain now?"     (Note: Intermittent means the pain goes away completely between bouts)     Constant - Advil  does not last long 6. SEVERITY: "How bad is the pain?"  (e.g., Scale 1-10; mild, moderate, or severe)   - MILD (1-3): doesn't interfere with normal activities, area soft and not tender to touch    - MODERATE (4-7): interferes with normal activities or awakens from sleep, abdomen tender to touch    - SEVERE (8-10): excruciating pain, doubled over, unable to do any normal activities      8/10 7. RECURRENT SYMPTOM: "Have you ever had this type of pelvic pain before?" If Yes, ask: "When was the last time?" and "What happened that time?"      Yes - hx of UTIs "for years", last seen for a UTI in January 8. CAUSE: "What do you think is causing the pelvic pain?"     Poss UTI 9. RELIEVING/AGGRAVATING FACTORS: "What makes it better or worse?" (e.g., activity/rest, sexual intercourse, voiding, passing stool)     Advil  helps but does not last long 10. OTHER SYMPTOMS: "Has there been any other symptoms?" (e.g., fever, constipation, diarrhea, urine  problems, vaginal bleeding, vaginal discharge, or vomiting?"       Burning with urination. Endorses urgency and frequency. Pt  states she does not know what her urine smells like, states her ability to smell is poor d/t a neurological issue. Denies hematuria. Denies fever or chills. Denies vaginal bleeding. "I have a place that's real raw, right next to my rectum and it's been hurting real bad, I can't get it healed." Denies that this  area bleeds. Denies constipation. Pt reports frequent Bms for a week and a half. Denies diarrhea at that time. Denies back pain.   Pt  reports a neurological disease, frequent falls, and stress d/t to her husband recently passing away. Denies  difficulty walking but states her legs get weak. Denies dizziness. Denies nausea and vomiting.  Protocols used: Pelvic Pain - Female-A-AH

## 2023-12-05 LAB — URINE CULTURE

## 2023-12-11 ENCOUNTER — Emergency Department (HOSPITAL_COMMUNITY)
Admission: EM | Admit: 2023-12-11 | Discharge: 2023-12-18 | Disposition: A | Discharge status: Home / Self Care | Attending: Emergency Medicine | Admitting: Emergency Medicine

## 2023-12-11 ENCOUNTER — Encounter (HOSPITAL_COMMUNITY): Payer: Self-pay | Admitting: Emergency Medicine

## 2023-12-11 ENCOUNTER — Other Ambulatory Visit: Payer: Self-pay

## 2023-12-11 ENCOUNTER — Emergency Department (HOSPITAL_COMMUNITY)

## 2023-12-11 DIAGNOSIS — R4182 Altered mental status, unspecified: Secondary | ICD-10-CM | POA: Insufficient documentation

## 2023-12-11 DIAGNOSIS — R45851 Suicidal ideations: Secondary | ICD-10-CM | POA: Diagnosis not present

## 2023-12-11 DIAGNOSIS — I700 Atherosclerosis of aorta: Secondary | ICD-10-CM | POA: Diagnosis not present

## 2023-12-11 DIAGNOSIS — F419 Anxiety disorder, unspecified: Secondary | ICD-10-CM | POA: Diagnosis present

## 2023-12-11 DIAGNOSIS — T424X2A Poisoning by benzodiazepines, intentional self-harm, initial encounter: Secondary | ICD-10-CM | POA: Diagnosis not present

## 2023-12-11 DIAGNOSIS — S199XXA Unspecified injury of neck, initial encounter: Secondary | ICD-10-CM | POA: Diagnosis not present

## 2023-12-11 DIAGNOSIS — R531 Weakness: Secondary | ICD-10-CM | POA: Diagnosis not present

## 2023-12-11 DIAGNOSIS — T50902A Poisoning by unspecified drugs, medicaments and biological substances, intentional self-harm, initial encounter: Secondary | ICD-10-CM | POA: Diagnosis present

## 2023-12-11 DIAGNOSIS — R9431 Abnormal electrocardiogram [ECG] [EKG]: Secondary | ICD-10-CM | POA: Diagnosis not present

## 2023-12-11 DIAGNOSIS — T1491XA Suicide attempt, initial encounter: Secondary | ICD-10-CM | POA: Diagnosis not present

## 2023-12-11 DIAGNOSIS — R001 Bradycardia, unspecified: Secondary | ICD-10-CM | POA: Diagnosis not present

## 2023-12-11 DIAGNOSIS — X838XXA Intentional self-harm by other specified means, initial encounter: Secondary | ICD-10-CM | POA: Diagnosis not present

## 2023-12-11 DIAGNOSIS — F29 Unspecified psychosis not due to a substance or known physiological condition: Secondary | ICD-10-CM | POA: Diagnosis not present

## 2023-12-11 DIAGNOSIS — R2681 Unsteadiness on feet: Secondary | ICD-10-CM | POA: Diagnosis not present

## 2023-12-11 DIAGNOSIS — I959 Hypotension, unspecified: Secondary | ICD-10-CM | POA: Diagnosis not present

## 2023-12-11 DIAGNOSIS — T424X2D Poisoning by benzodiazepines, intentional self-harm, subsequent encounter: Secondary | ICD-10-CM | POA: Insufficient documentation

## 2023-12-11 DIAGNOSIS — R0902 Hypoxemia: Secondary | ICD-10-CM | POA: Diagnosis not present

## 2023-12-11 LAB — COMPREHENSIVE METABOLIC PANEL WITH GFR
ALT: 11 U/L (ref 0–44)
AST: 19 U/L (ref 15–41)
Albumin: 3.4 g/dL — ABNORMAL LOW (ref 3.5–5.0)
Alkaline Phosphatase: 43 U/L (ref 38–126)
Anion gap: 7 (ref 5–15)
BUN: 16 mg/dL (ref 8–23)
CO2: 23 mmol/L (ref 22–32)
Calcium: 8.8 mg/dL — ABNORMAL LOW (ref 8.9–10.3)
Chloride: 111 mmol/L (ref 98–111)
Creatinine, Ser: 0.74 mg/dL (ref 0.44–1.00)
GFR, Estimated: >60 mL/min (ref >60)
Glucose, Bld: 103 mg/dL — ABNORMAL HIGH (ref 70–99)
Potassium: 3.7 mmol/L (ref 3.5–5.1)
Sodium: 141 mmol/L (ref 135–145)
Total Bilirubin: 0.6 mg/dL (ref 0.0–1.2)
Total Protein: 6.4 g/dL — ABNORMAL LOW (ref 6.5–8.1)

## 2023-12-11 LAB — URINALYSIS, ROUTINE W REFLEX MICROSCOPIC
Bilirubin Urine: NEGATIVE
Glucose, UA: NEGATIVE mg/dL
Hgb urine dipstick: NEGATIVE
Ketones, ur: NEGATIVE mg/dL
Nitrite: NEGATIVE
Protein, ur: NEGATIVE mg/dL
Specific Gravity, Urine: 1.004 — ABNORMAL LOW (ref 1.005–1.030)
pH: 5 (ref 5.0–8.0)

## 2023-12-11 LAB — TSH: TSH: 0.419 u[IU]/mL (ref 0.350–4.500)

## 2023-12-11 LAB — CBC WITH DIFFERENTIAL/PLATELET
Abs Immature Granulocytes: 0.01 10*3/uL (ref 0.00–0.07)
Basophils Absolute: 0.1 10*3/uL (ref 0.0–0.1)
Basophils Relative: 1 %
Eosinophils Absolute: 0.2 10*3/uL (ref 0.0–0.5)
Eosinophils Relative: 3 %
HCT: 39.1 % (ref 36.0–46.0)
Hemoglobin: 13 g/dL (ref 12.0–15.0)
Immature Granulocytes: 0 %
Lymphocytes Relative: 49 %
Lymphs Abs: 3 10*3/uL (ref 0.7–4.0)
MCH: 32.6 pg (ref 26.0–34.0)
MCHC: 33.2 g/dL (ref 30.0–36.0)
MCV: 98 fL (ref 80.0–100.0)
Monocytes Absolute: 0.4 10*3/uL (ref 0.1–1.0)
Monocytes Relative: 6 %
Neutro Abs: 2.5 10*3/uL (ref 1.7–7.7)
Neutrophils Relative %: 41 %
Platelets: 193 10*3/uL (ref 150–400)
RBC: 3.99 MIL/uL (ref 3.87–5.11)
RDW: 13.9 % (ref 11.5–15.5)
WBC: 6.1 10*3/uL (ref 4.0–10.5)
nRBC: 0 % (ref 0.0–0.2)

## 2023-12-11 LAB — MAGNESIUM: Magnesium: 2.3 mg/dL (ref 1.7–2.4)

## 2023-12-11 LAB — CBG MONITORING, ED: Glucose-Capillary: 127 mg/dL — ABNORMAL HIGH (ref 70–99)

## 2023-12-11 LAB — ACETAMINOPHEN LEVEL: Acetaminophen (Tylenol), Serum: <10 ug/mL — ABNORMAL LOW (ref 10–30)

## 2023-12-11 LAB — ETHANOL: Alcohol, Ethyl (B): <15 mg/dL (ref <15)

## 2023-12-11 LAB — SALICYLATE LEVEL: Salicylate Lvl: <7 mg/dL — ABNORMAL LOW (ref 7.0–30.0)

## 2023-12-11 MED ORDER — LACTATED RINGERS IV BOLUS
1000.0000 mL | Freq: Once | INTRAVENOUS | Status: AC
  Administered 2023-12-11: 1000 mL via INTRAVENOUS

## 2023-12-11 MED ORDER — CEPHALEXIN 500 MG PO CAPS
500.0000 mg | ORAL_CAPSULE | Freq: Three times a day (TID) | ORAL | Status: AC
  Administered 2023-12-12 – 2023-12-16 (×12): 500 mg via ORAL
  Filled 2023-12-11 (×12): qty 1

## 2023-12-11 MED ORDER — ACETAMINOPHEN 325 MG PO TABS
650.0000 mg | ORAL_TABLET | Freq: Once | ORAL | Status: AC
  Administered 2023-12-12: 650 mg via ORAL
  Filled 2023-12-11: qty 2

## 2023-12-11 MED ORDER — SODIUM CHLORIDE 0.9 % IV SOLN
1.0000 g | Freq: Once | INTRAVENOUS | Status: AC
  Administered 2023-12-12: 1 g via INTRAVENOUS
  Filled 2023-12-11: qty 10

## 2023-12-11 NOTE — ED Provider Notes (Incomplete)
 [place] [facility]ERGENCY DEPARTMENT AT [facility] Provider Note   CSN: [id number] Arrival date & time: 12/11/23  2123     History  Chief Complaint  Patient presents with  . Suicide Attempt    Linda Hayes is a 73 y.o. female.  HPI Patient presents for intentional medication overdose.  Medical history includes HLD, GERD, anxiety, depression, anemia.  Prescribed medications include Zoloft , Synthroid , Xanax .  She texted her family members that she was going to end her life and that she was sorry.  When they got to her home, she had taken a large amount of her prescribed Xanax .  She was attempting to take more when they knocked it away from her.  EMS was called to the scene.  EMS states that she had a 100 tablet prescription of 2 mg Xanax  filled 2 weeks ago.  This is prescribed to take 3 times daily as needed.  Approximately half the tablets were gone.  Estimated ingestion was 20 to 30 tablets.  Patient denies any coingestions.  Patient endorses current weakness.  She denies any other current symptoms.    Home Medications Prior to Admission medications   Medication Sig Start Date End Date Taking? Authorizing Provider  alprazolam  (XANAX ) 2 MG tablet Take 1 tablet (2 mg total) by mouth 3 (three) times daily as needed for anxiety. 08/28/23   Plotnikov, Aleksei V, MD  Cholecalciferol (VITAMIN D3) 50 MCG (2000 UT) capsule Take 1 capsule (2,000 Units total) by mouth daily. 07/19/20   Plotnikov, Aleksei V, MD  EPINEPHrine  (EPIPEN  2-PAK) 0.3 mg/0.3 mL IJ SOAJ injection Inject 0.3 mg into the muscle as needed for anaphylaxis. 12/03/23   Zorita Hiss, FNP  Multiple Vitamins-Minerals (CENTRUM SILVER  ADULT 50+) TABS 1 po qd 12/21/22   Plotnikov, Aleksei V, MD  sertraline  (ZOLOFT ) 100 MG tablet TAKE 2 TABLETS BY MOUTH EVERY DAY 10/19/23   Plotnikov, Aleksei V, MD  SYNTHROID  125 MCG tablet TAKE 1 TABLET BY MOUTH EVERY DAY 07/15/23   Plotnikov, Aleksei V, MD      Allergies    Macrobid   [nitrofurantoin ], Ambien  [zolpidem ], Ciprofloxacin, Erythromycin, Gabapentin , Hydrocodone , Penicillins, Tramadol , and Ritalin  [methylphenidate]    Review of Systems   Review of Systems  Neurological:  Positive for weakness.  Psychiatric/Behavioral:  Positive for suicidal ideas.   All other systems reviewed and are negative.   Physical Exam Updated Vital Signs BP 90/64 (BP Location: Left Arm)   Pulse 63   Temp 97.9 F (36.6 C) (Oral)   Resp 16   Ht 5\' 4"  (1.626 m)   Wt 73.9 kg   SpO2 95%   BMI 27.98 kg/m  Physical Exam Vitals and nursing note reviewed.  Constitutional:      General: She is not in acute distress.    Appearance: Normal appearance. She is well-developed. She is not ill-appearing, toxic-appearing or diaphoretic.  HENT:     Head: Normocephalic and atraumatic.     Right Ear: External ear normal.     Left Ear: External ear normal.     Nose: Nose normal.     Mouth/Throat:     Mouth: Mucous membranes are moist.  Eyes:     Extraocular Movements: Extraocular movements intact.     Conjunctiva/sclera: Conjunctivae normal.  Neck:     Comments: Area of erythema on anterior neck.  This may be skin change from neck fold.  Patient denies any attempt of hanging or strangulation. Cardiovascular:     Rate and Rhythm: Normal  rate and regular rhythm.  Pulmonary:     Effort: Pulmonary effort is normal. No respiratory distress.     Breath sounds: No wheezing or rales.  Chest:     Chest wall: No tenderness.  Abdominal:     General: There is no distension.     Palpations: Abdomen is soft.     Tenderness: There is no abdominal tenderness.  Musculoskeletal:        General: No swelling.     Cervical back: Normal range of motion and neck supple.  Skin:    General: Skin is warm.     Capillary Refill: Capillary refill takes less than 2 seconds.     Findings: Bruising present.  Neurological:     General: No focal deficit present.     Mental Status: She is alert and oriented  to person, place, and time.  Psychiatric:        Mood and Affect: Mood normal.        Behavior: Behavior normal.     ED Results / Procedures / Treatments   Labs (all labs ordered are listed, but only abnormal results are displayed) Labs Reviewed  COMPREHENSIVE METABOLIC PANEL WITH GFR - Abnormal; Notable for the following components:      Result Value   Glucose, Bld 103 (*)    Calcium 8.8 (*)    Total Protein 6.4 (*)    Albumin 3.4 (*)    All other components within normal limits  SALICYLATE LEVEL - Abnormal; Notable for the following components:   Salicylate Lvl <7.0 (*)    All other components within normal limits  ACETAMINOPHEN  LEVEL - Abnormal; Notable for the following components:   Acetaminophen  (Tylenol ), Serum <10 (*)    All other components within normal limits  RAPID URINE DRUG SCREEN, HOSP PERFORMED - Abnormal; Notable for the following components:   Opiates NEGATIVE (*)    Cocaine NEGATIVE (*)    Benzodiazepines POSITIVE (*)    Amphetamines NEGATIVE (*)    Tetrahydrocannabinol NEGATIVE (*)    Barbiturates NEGATIVE (*)    All other components within normal limits  URINALYSIS, ROUTINE W REFLEX MICROSCOPIC - Abnormal; Notable for the following components:   Color, Urine STRAW (*)    Specific Gravity, Urine 1.004 (*)    Leukocytes,Ua SMALL (*)    Bacteria, UA MANY (*)    All other components within normal limits  CBG MONITORING, ED - Abnormal; Notable for the following components:   Glucose-Capillary 127 (*)    All other components within normal limits  URINE CULTURE  ETHANOL  CBC WITH DIFFERENTIAL/PLATELET  MAGNESIUM  TSH    EKG EKG Interpretation Date/Time:  Tuesday December 11 2023 21:57:19 EDT Ventricular Rate:  62 PR Interval:  218 QRS Duration:  95 QT Interval:  408 QTC Calculation: 415 R Axis:   -23  Text Interpretation: Sinus rhythm Multiple ventricular premature complexes Borderline prolonged PR interval Probable left atrial enlargement  Borderline left axis deviation Low voltage, precordial leads Abnormal R-wave progression, early transition Confirmed by Iva Mariner ([id number]) on 12/11/2023 10:27:33 PM  Radiology No results found.  Procedures Procedures    Medications Ordered in ED Medications  cefTRIAXone (ROCEPHIN) 1 g in sodium chloride  0.9 % 100 mL IVPB (has no administration in time range)  lactated ringers bolus 1,000 mL (0 mLs Intravenous Stopped 12/11/23 2320)    ED Course/ Medical Decision Making/ A&P  Medical Decision Making Amount and/or Complexity of Data Reviewed Labs: ordered. Radiology: ordered.  Risk Prescription drug management.   This patient presents to the ED for concern of overdose, this involves an extensive number of treatment options, and is a complaint that carries with it a high risk of complications and morbidity.  The differential diagnosis includes toxic overdose, metabolic derangements, self-harm injury.   Co morbidities that complicate the patient evaluation  HLD, GERD, anxiety, depression, anemia   Additional history obtained:  Additional history obtained from EMS External records from outside source obtained and reviewed including EMR   Lab Tests:  I Ordered, and personally interpreted labs.  The pertinent results include: Serum lab work was unremarkable.  Urinalysis showed bacteria and pyuria.    Imaging Studies ordered:  I ordered imaging studies including CT head, CTA neck I independently visualized and interpreted imaging which showed (pending at time of signout) I agree with the radiologist interpretation   Cardiac Monitoring: / EKG:  The patient was maintained on a cardiac monitor.  I personally viewed and interpreted the cardiac monitored which showed an underlying rhythm of: Sinus rhythm   Problem List / ED Course / Critical interventions / Medication management  Patient presenting for medication overdose.  This was  intentional.  She texted her family members that she was going to end her life and that she was sorry.  They were able to get to the scene prior to patient taking more tablets.  She did ingest a large amount of Xanax .  She denies coingestion.  EMS reports soft blood pressures during transit.  Patient endorses generalized weakness.  Physical exam is notable for erythematous line on the anterior aspect of her neck.  This may just be secondary to positioning in neck fold, there is concern of possible self-harm.  She denies any attempt of hanging or strangulation.  Will obtain CTA neck.  Patient was placed on bedside cardiac monitor.  IV fluids were ordered.  IVC paperwork completed.  Patient serum lab work was unremarkable.  Urinalysis showed evidence of UTI.  Urine cultures were ordered and ceftriaxone was ordered for empiric treatment.  CT imaging was pending at time of signout.  Care of patient was signed out to oncoming ED provider. I ordered medication including IV fluids for hydration; ceftriaxone for UTI Reevaluation of the patient after these medicines showed that the patient improved I have reviewed the patients home medicines and have made adjustments as needed   Consultations Obtained:  I requested consultation with the TTS,  and discussed lab and imaging findings as well as pertinent plan - they recommend: (Pending)        Final Clinical Impression(s) / ED Diagnoses Final diagnoses:  Suicide attempt [facility])    Rx / DC Orders ED Discharge Orders     None         Iva Mariner, MD 12/11/23 2349

## 2023-12-11 NOTE — ED Triage Notes (Signed)
 Patient presents due to OD using Xanax . She took about 20-30 2 mg tablets around 1600. Patient did this as a suicide attempt. She reportedly told her family she was ready to die. She is now lethargic.   EMS vitals: 106/50 BP 50-70 P 112 CBG 98% SPO2 on  2 L O@ nasal canula

## 2023-12-11 NOTE — ED Notes (Signed)
 Patient reports bladder pain.

## 2023-12-11 NOTE — ED Provider Notes (Signed)
  EMERGENCY DEPARTMENT AT [facility] Provider Note   CSN: [id number] Arrival date & time: 12/11/23  2123     History  Chief Complaint  Patient presents with   Suicide Attempt    Linda Hayes is a 73 y.o. female.  HPI Patient presents for intentional medication overdose.  Medical history includes HLD, GERD, anxiety, depression, anemia.  Prescribed medications include Zoloft , Synthroid , Xanax .  She texted her family members that she was going to end her life and that she was sorry.  When they got to her home, she had taken a large amount of her prescribed Xanax .  She was attempting to take more when they knocked it away from her.  EMS was called to the scene.  EMS states that she had a 100 tablet prescription of 2 mg Xanax  filled 2 weeks ago.  This is prescribed to take 3 times daily as needed.  Approximately half the tablets were gone.  Estimated ingestion was 20 to 30 tablets.  Patient denies any coingestions.  Patient endorses current weakness.  She denies any other current symptoms.    Home Medications Prior to Admission medications   Medication Sig Start Date End Date Taking? Authorizing Provider  alprazolam  (XANAX ) 2 MG tablet Take 1 tablet (2 mg total) by mouth 3 (three) times daily as needed for anxiety. 08/28/23   Plotnikov, Aleksei V, MD  Cholecalciferol (VITAMIN D3) 50 MCG (2000 UT) capsule Take 1 capsule (2,000 Units total) by mouth daily. 07/19/20   Plotnikov, Aleksei V, MD  EPINEPHrine  (EPIPEN  2-PAK) 0.3 mg/0.3 mL IJ SOAJ injection Inject 0.3 mg into the muscle as needed for anaphylaxis. 12/03/23   Zorita Hiss, FNP  Multiple Vitamins-Minerals (CENTRUM SILVER  ADULT 50+) TABS 1 po qd 12/21/22   Plotnikov, Aleksei V, MD  sertraline  (ZOLOFT ) 100 MG tablet TAKE 2 TABLETS BY MOUTH EVERY DAY 10/19/23   Plotnikov, Aleksei V, MD  SYNTHROID  125 MCG tablet TAKE 1 TABLET BY MOUTH EVERY DAY 07/15/23   Plotnikov, Aleksei V, MD      Allergies    Macrobid   Anthonette Kinsman ], Ambien  [zolpidem ], Ciprofloxacin, Erythromycin, Gabapentin , Hydrocodone , Penicillins, Tramadol , and Ritalin  [methylphenidate]    Review of Systems   Review of Systems  Neurological:  Positive for weakness.  Psychiatric/Behavioral:  Positive for suicidal ideas.   All other systems reviewed and are negative.   Physical Exam Updated Vital Signs BP 90/64 (BP Location: Left Arm)   Pulse 63   Temp 97.9 F (36.6 C) (Oral)   Resp 16   Ht 5\' 4"  (1.626 m)   Wt 73.9 kg   SpO2 95%   BMI 27.98 kg/m  Physical Exam Vitals and nursing note reviewed.  Constitutional:      General: She is not in acute distress.    Appearance: Normal appearance. She is well-developed. She is not ill-appearing, toxic-appearing or diaphoretic.  HENT:     Head: Normocephalic and atraumatic.     Right Ear: External ear normal.     Left Ear: External ear normal.     Nose: Nose normal.     Mouth/Throat:     Mouth: Mucous membranes are moist.  Eyes:     Extraocular Movements: Extraocular movements intact.     Conjunctiva/sclera: Conjunctivae normal.  Neck:     Comments: Area of erythema on anterior neck.  This may be skin change from neck fold.  Patient denies any attempt of hanging or strangulation. Cardiovascular:     Rate and Rhythm: Normal  rate and regular rhythm.  Pulmonary:     Effort: Pulmonary effort is normal. No respiratory distress.     Breath sounds: No wheezing or rales.  Chest:     Chest wall: No tenderness.  Abdominal:     General: There is no distension.     Palpations: Abdomen is soft.     Tenderness: There is no abdominal tenderness.  Musculoskeletal:        General: No swelling.     Cervical back: Normal range of motion and neck supple.  Skin:    General: Skin is warm.     Capillary Refill: Capillary refill takes less than 2 seconds.     Findings: Bruising present.  Neurological:     General: No focal deficit present.     Mental Status: She is alert and oriented  to person, place, and time.  Psychiatric:        Mood and Affect: Mood normal.        Behavior: Behavior normal.     ED Results / Procedures / Treatments   Labs (all labs ordered are listed, but only abnormal results are displayed) Labs Reviewed  COMPREHENSIVE METABOLIC PANEL WITH GFR - Abnormal; Notable for the following components:      Result Value   Glucose, Bld 103 (*)    Calcium 8.8 (*)    Total Protein 6.4 (*)    Albumin 3.4 (*)    All other components within normal limits  SALICYLATE LEVEL - Abnormal; Notable for the following components:   Salicylate Lvl <7.0 (*)    All other components within normal limits  ACETAMINOPHEN  LEVEL - Abnormal; Notable for the following components:   Acetaminophen  (Tylenol ), Serum <10 (*)    All other components within normal limits  RAPID URINE DRUG SCREEN, HOSP PERFORMED - Abnormal; Notable for the following components:   Opiates NEGATIVE (*)    Cocaine NEGATIVE (*)    Benzodiazepines POSITIVE (*)    Amphetamines NEGATIVE (*)    Tetrahydrocannabinol NEGATIVE (*)    Barbiturates NEGATIVE (*)    All other components within normal limits  URINALYSIS, ROUTINE W REFLEX MICROSCOPIC - Abnormal; Notable for the following components:   Color, Urine STRAW (*)    Specific Gravity, Urine 1.004 (*)    Leukocytes,Ua SMALL (*)    Bacteria, UA MANY (*)    All other components within normal limits  CBG MONITORING, ED - Abnormal; Notable for the following components:   Glucose-Capillary 127 (*)    All other components within normal limits  URINE CULTURE  ETHANOL  CBC WITH DIFFERENTIAL/PLATELET  MAGNESIUM  TSH    EKG EKG Interpretation Date/Time:  Tuesday December 11 2023 21:57:19 EDT Ventricular Rate:  62 PR Interval:  218 QRS Duration:  95 QT Interval:  408 QTC Calculation: 415 R Axis:   -23  Text Interpretation: Sinus rhythm Multiple ventricular premature complexes Borderline prolonged PR interval Probable left atrial enlargement  Borderline left axis deviation Low voltage, precordial leads Abnormal R-wave progression, early transition Confirmed by Iva Mariner ([id number]) on 12/11/2023 10:27:33 PM  Radiology No results found.  Procedures Procedures    Medications Ordered in ED Medications  cefTRIAXone (ROCEPHIN) 1 g in sodium chloride  0.9 % 100 mL IVPB (has no administration in time range)  lactated ringers bolus 1,000 mL (0 mLs Intravenous Stopped 12/11/23 2320)    ED Course/ Medical Decision Making/ A&P  Medical Decision Making Amount and/or Complexity of Data Reviewed Labs: ordered. Radiology: ordered.  Risk Prescription drug management.   This patient presents to the ED for concern of overdose, this involves an extensive number of treatment options, and is a complaint that carries with it a high risk of complications and morbidity.  The differential diagnosis includes toxic overdose, metabolic derangements, self-harm injury.   Co morbidities that complicate the patient evaluation  HLD, GERD, anxiety, depression, anemia   Additional history obtained:  Additional history obtained from EMS External records from outside source obtained and reviewed including EMR   Lab Tests:  I Ordered, and personally interpreted labs.  The pertinent results include: Serum lab work was unremarkable.  Urinalysis showed bacteria and pyuria.    Imaging Studies ordered:  I ordered imaging studies including CT head, CTA neck I independently visualized and interpreted imaging which showed (pending at time of signout) I agree with the radiologist interpretation   Cardiac Monitoring: / EKG:  The patient was maintained on a cardiac monitor.  I personally viewed and interpreted the cardiac monitored which showed an underlying rhythm of: Sinus rhythm   Problem List / ED Course / Critical interventions / Medication management  Patient presenting for medication overdose.  This was  intentional.  She texted her family members that she was going to end her life and that she was sorry.  They were able to get to the scene prior to patient taking more tablets.  She did ingest a large amount of Xanax .  She denies coingestion.  EMS reports soft blood pressures during transit.  Patient endorses generalized weakness.  Physical exam is notable for erythematous line on the anterior aspect of her neck.  This may just be secondary to positioning in neck fold, there is concern of possible self-harm.  She denies any attempt of hanging or strangulation.  Will obtain CTA neck.  Patient was placed on bedside cardiac monitor.  IV fluids were ordered.  IVC paperwork completed.  Patient serum lab work was unremarkable.  Urinalysis showed evidence of UTI.  Urine cultures were ordered and ceftriaxone was ordered for empiric treatment.  CT imaging was pending at time of signout.  Care of patient was signed out to oncoming ED provider. I ordered medication including IV fluids for hydration; ceftriaxone for UTI Reevaluation of the patient after these medicines showed that the patient improved I have reviewed the patients home medicines and have made adjustments as needed   Consultations Obtained:  I requested consultation with the TTS,  and discussed lab and imaging findings as well as pertinent plan - they recommend: (Pending)        Final Clinical Impression(s) / ED Diagnoses Final diagnoses:  Suicide attempt [facility])    Rx / DC Orders ED Discharge Orders     None         Iva Mariner, MD 12/11/23 2349    Iva Mariner, MD 12/12/23 [phone]

## 2023-12-12 ENCOUNTER — Emergency Department (HOSPITAL_COMMUNITY)

## 2023-12-12 ENCOUNTER — Encounter: Payer: Self-pay | Admitting: Family Medicine

## 2023-12-12 DIAGNOSIS — T424X2A Poisoning by benzodiazepines, intentional self-harm, initial encounter: Secondary | ICD-10-CM | POA: Diagnosis not present

## 2023-12-12 DIAGNOSIS — R4182 Altered mental status, unspecified: Secondary | ICD-10-CM | POA: Diagnosis not present

## 2023-12-12 DIAGNOSIS — I700 Atherosclerosis of aorta: Secondary | ICD-10-CM | POA: Diagnosis not present

## 2023-12-12 DIAGNOSIS — S199XXA Unspecified injury of neck, initial encounter: Secondary | ICD-10-CM | POA: Diagnosis not present

## 2023-12-12 LAB — RAPID URINE DRUG SCREEN, HOSP PERFORMED
Amphetamines: NOT DETECTED — AB
Barbiturates: NOT DETECTED — AB
Benzodiazepines: POSITIVE — AB
Cocaine: NOT DETECTED — AB
Opiates: NOT DETECTED — AB
Tetrahydrocannabinol: NOT DETECTED — AB

## 2023-12-12 MED ORDER — IOHEXOL 350 MG/ML SOLN
75.0000 mL | Freq: Once | INTRAVENOUS | Status: AC | PRN
  Administered 2023-12-12: 75 mL via INTRAVENOUS

## 2023-12-12 MED ORDER — ALPRAZOLAM 0.5 MG PO TABS: 2.0000 mg | ORAL_TABLET | Freq: Three times a day (TID) | ORAL | Status: DC | PRN

## 2023-12-12 MED ORDER — SERTRALINE HCL 50 MG PO TABS
100.0000 mg | ORAL_TABLET | Freq: Every day | ORAL | Status: DC
  Administered 2023-12-12 – 2023-12-17 (×6): 100 mg via ORAL
  Filled 2023-12-12 (×6): qty 2

## 2023-12-12 MED ORDER — OLANZAPINE 5 MG PO TBDP
5.0000 mg | ORAL_TABLET | ORAL | Status: AC
  Administered 2023-12-12: 5 mg via ORAL
  Filled 2023-12-12: qty 1

## 2023-12-12 NOTE — ED Notes (Signed)
 Patient to room 28. Patient drowsy and sleeping.

## 2023-12-12 NOTE — BH Assessment (Signed)
 1610: Per [facility] coordinator, Linda Hayes, pt will be defer back to in house due to the report from RN stating pt has difficulty hearing and is soft spoken.

## 2023-12-12 NOTE — ED Notes (Signed)
 Patient pulled off pulse ox monitoring and brief that was placed. Changed patient with assistance from NT and got new pulse ox applied. Patient keeps stating her family doesn't know where she is and she doesn't care if she is breathing or not. Patient wont hardly open eyes when talking.

## 2023-12-12 NOTE — Progress Notes (Signed)
 LCSW Progress Note:   MRN: [id number]  Zaraya Vires. Vogan  12/12/2023 1921  Patient was recommended inpatient per Lady Pier NP. There are no available beds at [facility], per [facility] Kathryn Parish RN. Patient was re-faxed to the following out of network facilities:    Destination  Service Provider Address Phone Fax  [facility] [address] [place], [place] [address] [phone]  [facility] [address] [place] [address] [phone]  [facility] [address], [place] [address] [phone]  [facility] [address] [place] [address] [phone]  [facility] EFAX [address] [place], [place] [phone]  [facility] [address] [place], [place] [address] [phone]  [facility] [address], [place] [address] [phone]        Situation ongoing, CSW to continue following and update chart as more information becomes available.

## 2023-12-12 NOTE — Consult Note (Signed)
 [facility] Psychiatric Consult Initial  Patient Name: .Linda Hayes  MRN: [id number]  DOB: April 30, 1951  Consult Order details:  Orders (From admission, onward)     Start     Ordered   12/11/23 2349  CONSULT TO CALL ACT TEAM       Ordering Provider: Iva Mariner, MD  Provider:  (Not yet assigned)  Question:  Reason for Consult?  Answer:  suicide attempt   12/11/23 2348             Mode of Visit: In person    Psychiatry Consult Evaluation  Service Date: December 12, 2023 LOS:  LOS: 0 days  Chief Complaint Benzodiazepine overdose  Primary Psychiatric Diagnoses  Suicide attempt by benzodiazepine overdose  Assessment  Linda Hayes is a 73 y.o. female admitted: Presented to the EDfor 12/11/2023  9:26 PM brought in by EMS after texting her family that she was going to end her life and overdosing on her benzodiazepines. She carries the psychiatric diagnoses of anxiety disorder and depression, and has a past medical history of  dyslipidemia, hypothyroidism, recurrent UTIs, herpes zoster, meige syndrome, esophagitis and overactive bladder.   Her current presentation of wanting to end her life is most consistent with suicidal ideation. She meets criteria for inpatient psychiatric hospitalization based on being a danger to herself.  Current outpatient psychotropic medications include xanax  and zoloft  and historically she has had a positive response to these medications. She was compliant with medications prior to admission as evidenced by patient and family report. On initial examination, patient is confused and unable to communicate clearly due to an inability to clearly verbalize.  Please see plan below for detailed recommendations.   Diagnoses:  Active Hospital problems: Principal Problem:   Suicide attempt by benzodiazepine overdose (HCC)    Plan   ## Psychiatric Medication Recommendations:  --zoloft  100mg  PO Q day  ## Medical Decision Making Capacity: Not specifically addressed  in this encounter  ## Further Work-up:  -- most recent EKG on 12/12/2023 had QtC of 415 -- Pertinent labwork reviewed earlier this admission includes: CBC, CMP, acetaminophen , salicylate, TSH, UA, alcohol and UDS   ## Disposition:-- We recommend inpatient psychiatric hospitalization after medical hospitalization. Patient has been involuntarily committed on 12/11/2023.   ## Behavioral / Environmental: -Delirium Precautions: Delirium Interventions for Nursing and Staff: - RN to open blinds every AM. - To Bedside: Glasses, hearing aide, and pt's own shoes. Make available to patients. when possible and encourage use. - Encourage po fluids when appropriate, keep fluids within reach. - OOB to chair with meals. - Passive ROM exercises to all extremities with AM & PM care. - RN to assess orientation to person, time and place QAM and PRN. - Recommend extended visitation hours with familiar family/friends as feasible. - Staff to minimize disturbances at night. Turn off television when pt asleep or when not in use.    ## Safety and Observation Level:  - Based on my clinical evaluation, I estimate the patient to be at high risk of self harm in the current setting. - At this time, we recommend  1:1 Observation. This decision is based on my review of the chart including patient's history and current presentation, interview of the patient, mental status examination, and consideration of suicide risk including evaluating suicidal ideation, plan, intent, suicidal or self-harm behaviors, risk factors, and protective factors. This judgment is based on our ability to directly address suicide risk, implement suicide prevention strategies, and develop a safety plan  while the patient is in the clinical setting. Please contact our team if there is a concern that risk level has changed.  CSSR Risk Category:C-SSRS RISK CATEGORY: High Risk  Suicide Risk Assessment: Patient has following modifiable risk factors for suicide:  active suicidal ideation and under treated depression , which we are addressing by recommending inpatient psychiatric hospitalization. Patient has following non-modifiable or demographic risk factors for suicide: female gender and history of suicide attempt Patient has the following protective factors against suicide: Supportive family  Thank you for this consult request. Recommendations have been communicated to the primary team.  We will continue to follow at this time.   Jeraline Moment, NP       History of Present Illness  Relevant Aspects of Hospital ED Course:  Admitted on 12/11/2023 brought in by EMS after texting her family that she was going to end her life and overdosing on her benzodiazepines. She carries the psychiatric diagnoses of anxiety disorder and depression, and has a past medical history of  dyslipidemia, hypothyroidism, recurrent UTIs, herpes zoster, meige syndrome, esophagitis and overactive bladder.    Patient Report:  Linda Hayes, is seen face to face by this provider, consulted with Dr. Deborah Falling; and chart reviewed on 12/12/23.  On evaluation GALINA OREA reports she wants to die.  Patient was unable to clearly articulate much beyond that.  She has difficulty verbalizing due to her meige syndrome.     During evaluation CHARLYNNE TUBBS is laying on a stretcher in mild distress.  She is alert & oriented x 2.  Her mood is depressed  with congruent affect.  She has very garbled speech.  Objectively there is no evidence of psychosis/mania or delusional thinking. Pt does not appear to be responding to internal or external stimuli.  Patient is not able to converse coherently.  She denies homicidal ideation, psychosis, and paranoia; patient endorses suicidal/self-harm ideation.  Patient answered question appropriately.    Psych ROS:  Depression: endorses  Anxiety:  endorses  Mania (lifetime and current): denies Psychosis: (lifetime and current): denies  Collateral information:   Contacted Ginnie Laine at [phone] on 12/12/2023. Patient has experienced significant loss in the last year.  Patient's husband died in 09-12-23 and she had to move in with her daughter.  Patient's ex-husband killed himself in August.  Please see note from Patt Boozer [facility] for more information.  Review of Systems  Genitourinary:  Positive for dysuria, frequency and urgency.  Skin:        Bruising on right inner thigh  Neurological:  Positive for speech change.  Psychiatric/Behavioral:  Positive for depression and suicidal ideas.   All other systems reviewed and are negative.    Psychiatric and Social History  Psychiatric History:  Information collected from patient chart review and family member  Prev Dx/Sx: anxiety disorder and depression Current Psych Provider: none Home Meds (current): zoloft  and xanax  Previous Med Trials: unknown Therapy: none  Prior Psych Hospitalization: none  Prior Self Harm: none Prior Violence: none  Family Psych History: depression and anxiety in all relatives per daughter Family Hx suicide: no  Social History:  Developmental Hx: WNL  Educational Hx: unknown Occupational Hx: unemployed Armed forces operational officer Hx: none Living Situation: lives with family Spiritual Hx: none noted Access to weapons/lethal means: denies   Substance History Alcohol: UTA  Tobacco: UTA Illicit drugs: UTA Prescription drug abuse: Possible overuse of benzodiazepines per daughter Rehab hx: none  Exam Findings  Physical Exam:  Vital Signs:  Temp:  [97.5 F (36.4 C)-98.5 F (36.9 C)] 97.5 F (36.4 C) (04/30 0830) Pulse Rate:  [56-72] 72 (04/30 0830) Resp:  [12-31] 19 (04/30 0830) BP: (90-109)/(62-91) 97/62 (04/30 0830) SpO2:  [95 %-100 %] 100 % (04/30 0830) Weight:  [73.9 kg] 73.9 kg (04/29 2342) Blood pressure 97/62, pulse 72, temperature (!) 97.5 F (36.4 C), temperature source Axillary, resp. rate 19, height 5\' 4"  (1.626 m), weight 73.9 kg, SpO2 100%. Body mass index  is 27.98 kg/m.  Physical Exam Vitals and nursing note reviewed.  Constitutional:      Appearance: She is obese.  HENT:     Mouth/Throat:     Comments: Tongue with thick white coating Eyes:     Pupils: Pupils are equal, round, and reactive to light.  Pulmonary:     Effort: Pulmonary effort is normal.  Skin:    General: Skin is dry.     Findings: Bruising present.  Neurological:     Mental Status: She is alert.  Psychiatric:        Mood and Affect: Mood is depressed.        Speech: Speech is slurred.        Behavior: Behavior is cooperative.        Thought Content: Thought content includes suicidal ideation.     Comments: Very garbled speech     Mental Status Exam: General Appearance: Disheveled  Orientation:  Other:  UTA fully, at least oriented to self and place  Memory:   UTA  Concentration:  Concentration: Fair  Recall:   UTA  Attention  Fair  Eye Contact:  Good  Speech:  Garbled  Language:   UTA  Volume:  Normal  Mood: depressed  Affect:  Congruent  Thought Process:  Goal Directed  Thought Content:   UTA  Suicidal Thoughts:  Yes.  with intent/plan  Homicidal Thoughts:  No  Judgement:  Impaired  Insight:  Lacking  Psychomotor Activity:  Decreased  Akathisia:  No  Fund of Knowledge:   UTA      Assets:  Housing Leisure Time Social Support  Cognition:  UTA  ADL's:  Impaired  AIMS (if indicated):        Other History   These have been pulled in through the EMR, reviewed, and updated if appropriate.  Family History:  The patient's family history includes Colon polyps in her sister; Depression in an other family member; Diabetes in an other family member; Hypertension in an other family member; Other in her mother; Prostate cancer in her father.  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   Cancer (HCC)    thyroid  cancer    Cataract    forming both  eyes    Colitis due to Clostridioides difficile 2007   DEPRESSION 03/17/2008   GAIT ATAXIA 06/11/2009   GERD (gastroesophageal reflux disease)    diet related    Hyperlipidemia    HYPOTHYROIDISM 03/17/2008   INSOMNIA, PERSISTENT 03/17/2008   Meige syndrome (blepharospasm with oromandibular dystonia)    OBESITY 03/17/2008   Palpitations 10/01/2008   SWEATING 03/17/2008    Surgical History: Past Surgical History:  Procedure Laterality Date   COLONOSCOPY     THYROIDECTOMY  1999   TONSILLECTOMY       Medications:   Current Facility-Administered Medications:    ALPRAZolam  (XANAX ) tablet 2 mg, 2 mg, Oral, TID PRN, Pollina, Marine Sia, MD  cephALEXin  (KEFLEX ) capsule 500 mg, 500 mg, Oral, Q8H, Iva Mariner, MD  Current Outpatient Medications:    alprazolam  (XANAX ) 2 MG tablet, Take 1 tablet (2 mg total) by mouth 3 (three) times daily as needed for anxiety., Disp: 270 tablet, Rfl: 1   Cholecalciferol (VITAMIN D3) 50 MCG (2000 UT) capsule, Take 1 capsule (2,000 Units total) by mouth daily., Disp: 100 capsule, Rfl: 3   EPINEPHrine  (EPIPEN  2-PAK) 0.3 mg/0.3 mL IJ SOAJ injection, Inject 0.3 mg into the muscle as needed for anaphylaxis., Disp: 2 each, Rfl: 0   Multiple Vitamins-Minerals (CENTRUM SILVER  ADULT 50+) TABS, 1 po qd, Disp: 100 tablet, Rfl: 3   sertraline  (ZOLOFT ) 100 MG tablet, TAKE 2 TABLETS BY MOUTH EVERY DAY, Disp: 180 tablet, Rfl: 1   SYNTHROID  125 MCG tablet, TAKE 1 TABLET BY MOUTH EVERY DAY, Disp: 90 tablet, Rfl: 3  Allergies: Allergies  Allergen Reactions   Macrobid  [Nitrofurantoin ]    Ambien  [Zolpidem ]     Memory, balance issues   Ciprofloxacin     Caused colitis   Erythromycin     Intolerance - GI   Gabapentin      cough   Hydrocodone  Nausea And Vomiting   Penicillins Other (See Comments)    Rash. Pt can take cephalosporins ok.   Tramadol      n/v   Ritalin  [Methylphenidate] Palpitations    Kenwood Rosiak A Magdelena Kinsella, NP

## 2023-12-12 NOTE — Progress Notes (Signed)
 LCSW Progress Note:  MRN: [id number]  Linda Hayes  12/12/2023 2011   Patient was recommended inpatient per Lady Pier NP. There are no available beds at [facility], per [facility] Kathryn Parish RN. Patient was re-faxed to alternative facilities including [facility]. Patient declined due to Dementia diagnosis.

## 2023-12-12 NOTE — ED Notes (Signed)
 Patient has continuously needed redirection with not pulling at wires and fidgeting. Anxiety meds was given. Patient seems to have intermittent confusion. Patient was thinking we was at her home and was taking things out of her closet. Patient at this time is attempting to sleep.

## 2023-12-12 NOTE — ED Notes (Signed)
 [facility] called pts daughter Ginnie Laine for collateral information. Per pts daughter, last night pt called her daughter to "say goodbye". Pts daughter called her sister [name] whom the pt lives with to check on pt and found her putting pills in her mouth and called EMS. Pt was transported to the [facility] ED. Per pts daughter, she is unaware of pt having tried to kill herself before though has spoken about suicide since pts daughter was a child. Pt was seeing a psychiatrist years ago who prescribed depression medication but pt currently receives medications from her PCP. Pt is diagnosed with MDD and anxiety. Pt has been taking Sertraline  for more than 20 years. Her PCP was thinking of changing the medication and also weaning her off of xanax . Per pts daughter she is unsure if pt has been abusing the xanax  that she is prescribed but does notice that she takes two pills sometimes at night and that she falls easily and bumps into things.  Per pts daughter, pt lost her husband in January and has moved in with her daughter [name] which has been a big adjustment for both. Pt ex-husband (divorced (40 years) and the father of pts daughters committed suicide in August of last year. Pts daughter reports a family history of mental illness with her father committing suicide, pts mother having MDD, pts sister suffered a mental breakdown in her 70's when she shot her neighbor with a gun injuring the person and was committed to [facility] for several years. Pts other sister also suffered from depression though was not formally diagnosed. All three of pts daughter are currently taking medication for depression.   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 Pt has a history of UTI's and thrush from certain medications she takes. Pts daughter reports that pt has been "talking crazy" lately. Pts daughter would like to be notified of her disposition.   Patt Boozer,  [facility]  12/12/23

## 2023-12-12 NOTE — ED Provider Notes (Signed)
 Signed out to me by Dr. Kermit Ped with imaging pending.  CTs have been reviewed.  No signs of abnormality.  Patient is medically clear for psychiatric evaluation and treatment.   Linda Bongo, MD 12/12/23 0120

## 2023-12-12 NOTE — BH Assessment (Signed)
 Patient was deferred to [facility] for a telepsych assessment. The assigned care coordinator will provide updates regarding the scheduling of the assessment. [facility] care coordinator can be reached at [phone] for further information on the timing of the telepsych evaluation.

## 2023-12-12 NOTE — ED Notes (Signed)
 Patients family expressed wanting information on how to get the process started for getting patient into a SNF placement. I informed family I could pass along the message and have them follow up during day shift. Family lives with one daughter now and due to recent events and falls they are wanting to explore SNF option for patient.

## 2023-12-12 NOTE — Progress Notes (Signed)
 LCSW Progress Note  [id number]   ARCELY NILE  12/12/2023  11:00 AM  Description:   Inpatient Psychiatric Referral  Patient was recommended inpatient per Lady Pier NP. There are no available beds at [facility], per [facility] Kathryn Parish RN. Patient was referred to the following out of network facilities:   Destination  Service Provider Address Phone Fax  [facility] [address] [place], [place] [address] [phone]  [facility] [address] [place] [address] [phone]  [facility] [address], [place] [address] [phone]  [facility] [address] [place] [address] [phone]  [facility] EFAX [address] [place], [place] [phone]  [facility] [address] [place], [place] [address] [phone]  [facility] [address] [place] [address] [phone]      Situation ongoing, CSW to continue following and update chart as more information becomes available.    [place] Leonte Horrigan, MSW, LCSW  12/12/2023 11:00 AM

## 2023-12-12 NOTE — ED Notes (Signed)
 Patient removed IV, No bleeding noted.

## 2023-12-12 NOTE — ED Provider Notes (Addendum)
 73 yo female here for SI and confusion - reportedly tried to ingest many pills at home including xanax  (prescribed 2 mg tablets per PDMP)  Pt is now medically cleared after 10 hours in ED, labs and CT imaging reviewed.  She remains intermittently confused, needing to be re-directed.  PCP notes hx of "dementia" and adjustment disorder  And a hx of recurring UTI's.  UA with small leuks and bacteria.  IV rocephin given and keflex  ordered for UTI coverage.  However the presentation overall is more consistent with overdose toxidrome and psychiatric disturbance due to intentional overdose per initial EMS report and provider note    Patient will likely need xanax  phased back into daily regimen later today given long-term xanax /benzo dependency to avoid acute withdrawal.  This was ordered by earlier provider to start this evening/afternoon.      Linda Birmingham, MD 12/12/23 [phone]

## 2023-12-13 ENCOUNTER — Telehealth: Payer: Self-pay | Admitting: Internal Medicine

## 2023-12-13 DIAGNOSIS — T424X2A Poisoning by benzodiazepines, intentional self-harm, initial encounter: Secondary | ICD-10-CM | POA: Diagnosis not present

## 2023-12-13 MED ORDER — LEVOTHYROXINE SODIUM 25 MCG PO TABS
125.0000 ug | ORAL_TABLET | Freq: Every day | ORAL | Status: DC
  Administered 2023-12-14 – 2023-12-18 (×5): 125 ug via ORAL
  Filled 2023-12-13 (×5): qty 1

## 2023-12-13 MED ORDER — CARMEX CLASSIC LIP BALM EX OINT
TOPICAL_OINTMENT | Freq: Once | CUTANEOUS | Status: AC
  Filled 2023-12-13: qty 10

## 2023-12-13 NOTE — Telephone Encounter (Signed)
 Copied from CRM [id number]. Topic: Clinical - Medical Advice >> Dec 13, 2023 12:21 PM Jethro Morrison wrote: Reason for CRM: PT IN THE HOSPITAL FOR A UTI AND SHE IS ON ANTIBIOTICS SHE WANTED THE DOCTOR TO KNOW THIS. SHE IS WANTING THE PROVIDER TO REACH OUT TO HER AMANDA IS THE DAUGHTER [phone]. SHE IS A LITTLE CONFUSED ABOUT WHERE SHE IS BUT THE DAUGHTER IS WITH HER.  ---  Conflicting ED notes

## 2023-12-13 NOTE — Progress Notes (Signed)
 LCSW Progress Note  [id number]   Linda Hayes  12/13/2023  12:35 PM  Description:   Inpatient Psychiatric Referral  Patient was recommended inpatient per Lady Pier NP There are no available beds at [facility], per [facility] RN. Patient was referred to the following out of network facilities:   Destination  Service Provider Address Phone Fax  [facility] [address] [place], [place] [address] [phone]  [facility] [address] [place] [address] [phone]  [facility] [address], [place] [address] [phone]  [facility] [address], [place] [address] [phone]  [facility] EFAX [address] [place], [place] [phone]  [facility] [address] [place], [place] [address] [phone]  [facility] [address] [place] [address] [phone]      Situation ongoing, CSW to continue following and update chart as more information becomes available.      Linda Hayes, MSW, LCSW  12/13/2023 12:35 PM

## 2023-12-13 NOTE — ED Provider Notes (Signed)
 Emergency Medicine Observation Re-evaluation Note  Linda Hayes is a 73 y.o. female, seen on rounds today.  Pt initially presented to the ED for complaints of Suicide Attempt Currently, the patient is psych placed.  Physical Exam  BP 98/83 (BP Location: Right Arm)   Pulse 73   Temp 98.5 F (36.9 C) (Oral)   Resp 18   Ht 5\' 4"  (1.626 m)   Wt 73.9 kg   SpO2 98%   BMI 27.98 kg/m  Physical Exam General: NAD Cardiac: good peripheral perfusion Lungs: bilateral chest rise Psych: resting comfortably  ED Course / MDM  EKG:EKG Interpretation Date/Time:  Tuesday December 11 2023 21:57:19 EDT Ventricular Rate:  62 PR Interval:  218 QRS Duration:  95 QT Interval:  408 QTC Calculation: 415 R Axis:   -23  Text Interpretation: Sinus rhythm Multiple ventricular premature complexes Borderline prolonged PR interval Probable left atrial enlargement Borderline left axis deviation Low voltage, precordial leads Abnormal R-wave progression, early transition Confirmed by Iva Mariner [phone]) on 12/11/2023 10:27:33 PM  I have reviewed the labs performed to date as well as medications administered while in observation.  Recent changes in the last 24 hours include n/a.  Plan  Current plan is for psych placement.    Albertus Hughs, DO 12/13/23 [phone]

## 2023-12-13 NOTE — Telephone Encounter (Signed)
 Copied from CRM [id number]. Topic: Clinical - Lab/Test Results >> Dec 13, 2023 11:01 AM Chuck Crater wrote: Reason for CRM: Patient daughter wants to know if her mom's gene test has came back. Patient is currently in the hospital for wanting to harm herself and daughter wants to know if it the results yet.

## 2023-12-14 DIAGNOSIS — T424X2A Poisoning by benzodiazepines, intentional self-harm, initial encounter: Secondary | ICD-10-CM | POA: Diagnosis not present

## 2023-12-14 LAB — URINE CULTURE: Culture: >=100000 — AB

## 2023-12-14 MED ORDER — TRAZODONE HCL 50 MG PO TABS
50.0000 mg | ORAL_TABLET | Freq: Every day | ORAL | Status: DC
  Administered 2023-12-14 – 2023-12-17 (×4): 50 mg via ORAL
  Filled 2023-12-14 (×4): qty 1

## 2023-12-14 MED ORDER — HYDROXYZINE HCL 25 MG PO TABS
25.0000 mg | ORAL_TABLET | Freq: Three times a day (TID) | ORAL | Status: DC | PRN
  Administered 2023-12-14 – 2023-12-18 (×5): 25 mg via ORAL
  Filled 2023-12-14 (×5): qty 1

## 2023-12-14 NOTE — Telephone Encounter (Signed)
 I am aware of the patient's hospital admission.  Thanks

## 2023-12-14 NOTE — Evaluation (Signed)
 Physical Therapy Evaluation Patient Details Name: Linda Hayes MRN: [id number] DOB: Sep 08, 1950 Today's Date: 12/14/2023  History of Present Illness  73 yo female presents to ED4/29/25  via EMS with intentional medication overdose. VHQ:IONG ataxia, blepharospasm w/ mandibular dystonia, GERD  Clinical Impression  Pt admitted with above diagnosis.  Pt currently with functional limitations due to the deficits listed below (see PT Problem List). Pt will benefit from acute skilled PT to increase their independence and safety with mobility to allow discharge.     The patient is oriented , able to  explain her blepharospasm diagnosis and symptoms.Patient reports that she uses a cane at baseline.    Patient requires min assistance for safe ambulation using a RW x 40', noted ataxia and knees remain flexed during ambulation. Patient reports history of falls.   Per  notes, patient is to DC to [facility] facility. Currently will require assistance for safe ambulation.  Continue PT while in acute care.        If plan is discharge home, recommend the following: A little help with walking and/or transfers;A little help with bathing/dressing/bathroom;Assistance with cooking/housework;Assist for transportation;Help with stairs or ramp for entrance   Can travel by private vehicle        Equipment Recommendations None recommended by PT  Recommendations for Other Services       Functional Status Assessment Patient has had a recent decline in their functional status and demonstrates the ability to make significant improvements in function in a reasonable and predictable amount of time.     Precautions / Restrictions Precautions Precautions: Fall Precaution/Restrictions Comments: ataxia Restrictions Weight Bearing Restrictions Per Provider Order: No      Mobility  Bed Mobility Overal bed mobility: Needs Assistance Bed Mobility: Supine to Sit     Supine to sit: Supervision           Transfers Overall transfer level: Needs assistance Equipment used: Rolling walker (2 wheels) Transfers: Sit to/from Stand Sit to Stand: Min assist           General transfer comment: steady assistance to stand, noted  knees are flexed in standing    Ambulation/Gait Ambulation/Gait assistance: Min assist Gait Distance (Feet): 40 Feet Assistive device: Rolling walker (2 wheels) Gait Pattern/deviations: Step-to pattern, Ataxic, Trunk flexed, Shuffle Gait velocity: decr     General Gait Details: noted knees are in flexion throughout gait, shuffling gait,  Stairs            Wheelchair Mobility     Tilt Bed    Modified Rankin (Stroke Patients Only)       Balance Overall balance assessment: History of Falls, Needs assistance Sitting-balance support: Feet supported, No upper extremity supported Sitting balance-Leahy Scale: Fair     Standing balance support: During functional activity, Bilateral upper extremity supported, Reliant on assistive device for balance Standing balance-Leahy Scale: Poor                               Pertinent Vitals/Pain Pain Assessment Pain Assessment: No/denies pain    Home Living Family/patient expects to be discharged to:: Private residence Living Arrangements: Children Available Help at Discharge: Family;Available PRN/intermittently Type of Home: Apartment           Home Equipment: Cane - single point Additional Comments: home  environment details not obtained    Prior Function Prior Level of Function : Needs assist  Mobility Comments: reorts ambulates with cane frequent falls       Extremity/Trunk Assessment   Upper Extremity Assessment Upper Extremity Assessment: Overall WFL for tasks assessed    Lower Extremity Assessment Lower Extremity Assessment: RLE deficits/detail;LLE deficits/detail RLE Deficits / Details: noted  decreased fine moter, somewhat lacks control of  speded of  movement LLE Deficits / Details: similar to the right    Cervical / Trunk Assessment Cervical / Trunk Assessment: Kyphotic  Communication   Communication Factors Affecting Communication: Reduced clarity of speech    Cognition Arousal: Alert Behavior During Therapy: WFL for tasks assessed/performed   PT - Cognitive impairments: No family/caregiver present to determine baseline                       PT - Cognition Comments: oriented to place and date, able to state her neurological diagnosis Following commands: Intact       Cueing Cueing Techniques: Verbal cues     General Comments      Exercises     Assessment/Plan    PT Assessment Patient needs continued PT services  PT Problem List Decreased strength;Decreased cognition;Decreased activity tolerance;Decreased mobility;Decreased knowledge of precautions;Decreased balance       PT Treatment Interventions DME instruction;Therapeutic exercise;Gait training;Balance training;Functional mobility training;Therapeutic activities;Patient/family education    PT Goals (Current goals can be found in the Care Plan section)  Acute Rehab PT Goals Patient Stated Goal: agreed to ambulate PT Goal Formulation: With patient Time For Goal Achievement: 12/28/23 Potential to Achieve Goals: Fair    Frequency Min 2X/week     Co-evaluation               AM-PAC PT "6 Clicks" Mobility  Outcome Measure Help needed turning from your back to your side while in a flat bed without using bedrails?: None Help needed moving from lying on your back to sitting on the side of a flat bed without using bedrails?: None Help needed moving to and from a bed to a chair (including a wheelchair)?: A Little Help needed standing up from a chair using your arms (e.g., wheelchair or bedside chair)?: A Little Help needed to walk in hospital room?: A Little Help needed climbing 3-5 steps with a railing? : A Lot 6 Click Score: 19    End of  Session Equipment Utilized During Treatment: Gait belt Activity Tolerance: Patient tolerated treatment well Patient left: in bed;with nursing/sitter in room Nurse Communication: Mobility status PT Visit Diagnosis: Unsteadiness on feet (R26.81);History of falling (Z91.81);Other symptoms and signs involving the nervous system (R29.898)    Time: 4098-1191 PT Time Calculation (min) (ACUTE ONLY): 10 min   Charges:   PT Evaluation $PT Eval Low Complexity: 1 Low   PT General Charges $$ ACUTE PT VISIT: 1 Visit         Abelina Hoes PT Acute Rehabilitation Services Office [phone] Weekend [phone]   Dareen Ebbing 12/14/2023, 10:10 AM

## 2023-12-14 NOTE — ED Notes (Signed)
 [facility] prepared an envelope of resources for pt and her family that includes information about [facility] and a Camera operator.   Patt Boozer, [facility]  12/14/23

## 2023-12-14 NOTE — Consult Note (Signed)
 [facility] Psychiatric Consult Initial  Patient Name: .Linda Hayes  MRN: [id number]  DOB: August 28, 1950  Consult Order details:  Orders (From admission, onward)     Start     Ordered   12/11/23 2349  CONSULT TO CALL ACT TEAM       Ordering Provider: Iva Mariner, MD  Provider:  (Not yet assigned)  Question:  Reason for Consult?  Answer:  suicide attempt   12/11/23 2348             Mode of Visit: In person    Psychiatry Consult Evaluation  Service Date: Dec 14, 2023 LOS:  LOS: 0 days  Chief Complaint Benzodiazepine overdose  Primary Psychiatric Diagnoses  Suicide attempt by benzodiazepine overdose  Assessment  Linda Hayes is a 73 y.o. female admitted: Presented to the EDfor 12/11/2023  9:26 PM brought in by EMS after texting her family that she was going to end her life and overdosing on her benzodiazepines. She carries the psychiatric diagnoses of anxiety disorder and depression, and has a past medical history of  dyslipidemia, hypothyroidism, recurrent UTIs, herpes zoster, meige syndrome, esophagitis and overactive bladder.   On evaluation today, the patient is sitting up  on the side of her bed.  She is calm and cooperative during this assessment. His appearance is appropriate for environment. Her eye contact is good.  Speech is clear and coherent, normal pace and normal volume. He is alert and oriented x4 to person, place, time, and situation. She reports her mood is euthymic.  Affect is congruent with mood.  Thought process is coherent and disorganized.  Thought content is slightly tangential.  She denies auditory and visual hallucinations.  No indication that she is responding to internal stimuli during this assessment.  No delusions elicited during this assessment.  She denies suicidal ideations.  She denies homicidal ideations. Appetite and sleep are fair.   This provider discussed with her the various that are keeping her in the hospital, why she has not been admitted to  inpatient psychiatric facility.  Patient states that she understood, discussed with her about the possibility of being discharged on tomorrow, and going back home with her daughter, with provided outpatient resources for medication management, senior day programs, and medication management.   Diagnoses:  Active Hospital problems: Principal Problem:   Suicide attempt by benzodiazepine overdose (HCC) Active Problems:   Anxiety disorder    Plan   ## Psychiatric Medication Recommendations:  --zoloft  100mg  PO Q day Give trazodone  50 mg as needed for sleep Give Atarax  25 mg p.o. 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  No noted withdrawal symptoms from Xanax , no agitation or aggressive behaviors in the past 48 hours.   ## Behavioral / Environmental: -Delirium Precautions: Delirium Interventions for Nursing and Staff: - RN to open blinds every AM. - To Bedside: Glasses, hearing aide, and pt's own shoes. Make available to patients. when possible and encourage use. - Encourage po fluids when appropriate, keep fluids within reach. - OOB to  chair with meals. - Passive ROM exercises to all extremities with AM & PM care. - RN to assess orientation to person, time and place QAM and PRN. - Recommend extended visitation hours with familiar family/friends as feasible. - Staff to minimize disturbances at night. Turn off television when pt asleep or when not in use.    ## Safety and Observation Level:  - Based on my  clinical evaluation, I estimate the patient to be at high risk of self harm in the current setting. - At this time, we recommend  1:1 Observation. This decision is based on my review of the chart including patient's history and current presentation, interview of the patient, mental status examination, and consideration of suicide risk including evaluating suicidal ideation, plan, intent, suicidal or self-harm behaviors, risk factors, and protective factors. This judgment is based on our ability to directly address suicide risk, implement suicide prevention strategies, and develop a safety plan while the patient is in the clinical setting. Please contact our team if there is a concern that risk level has changed.  CSSR Risk Category:C-SSRS RISK CATEGORY: High Risk  Suicide Risk Assessment: Patient has following modifiable risk factors for suicide: None; we will monitor patient overnight for possible discharge tomorrow on 12/15/2023. Patient has following non-modifiable or demographic risk factors for suicide: History of suicide attempt  patient has the following protective factors against suicide: Supportive family  Thank you for this consult request. Recommendations have been communicated to the primary team.  We will continue to follow at this time.   Othello Sgroi MOTLEY-MANGRUM, PMHNP       History of Present Illness  Relevant Aspects of Hospital ED Course:  Admitted on 12/11/2023 brought in by EMS after texting her family that she was going to end her life and overdosing on her benzodiazepines. She carries the psychiatric diagnoses of anxiety disorder and depression, and has a past medical history of  dyslipidemia, hypothyroidism, recurrent UTIs, herpes zoster, meige syndrome, esophagitis and overactive bladder.    Patient Report:  On evaluation today, the patient is sitting up  on the side of her bed.  She is calm and cooperative during this assessment. His appearance is appropriate for environment.  Her eye contact is good.  Speech is clear and coherent, normal pace and normal volume. He is alert and oriented x4 to person, place, time, and situation. She reports her mood is euthymic.  Affect is congruent with mood.  Thought process is coherent and disorganized.  Thought content is slightly tangential.  She denies auditory and visual hallucinations.  No indication that she is responding to internal stimuli during this assessment.  No delusions elicited during this assessment.  She denies suicidal ideations.  She denies homicidal ideations. Appetite and sleep are fair.   This provider discussed with her the various that are keeping her in the hospital, why she has not been admitted to inpatient psychiatric facility.  Patient states that she understood, discussed with her about the possibility of being discharged on tomorrow, and going back home with her daughter, with provided outpatient resources for medication management, senior day programs, and medication management.  Patient has not displayed any withdrawal symptoms of Xanax  in the past 48 hours, patient denies tremors, sweating, loss of appetite, no seizures or psychosis have been reported.  Patient is more concerned and why she has not had any withdrawal symptoms, discussed with her observing her 1 more night and started her on Atarax  which helps with anxiety and trazodone  for sleep.  Patient is in agreement.  Patient's daughter [name] is also at the hospital, with patient's permission spoke with [name] with patient after we spoke with patient separately, discussed with both, the barriers of placing patient in an inpatient psychiatric facility.  Discussed with each outpatient options such as the [facility], which is specifically for seniors to help them get out of their house in the daytime and provides activities and transportation.  Also discussed with each of them the benefits of individual and family therapy.  Discussed  starting patient on Atarax  for anxiety and possibly trazodone  to help with sleep.  Patient lives with [name], safety plan with [name] and patient to dispose of any extra Xanax , also dispose of any children's Benadryl and Ambien  that patient had in her property, patient and daughter are in agreement.  Gave daughter resources for behavioral health urgent care, and informed her that patient and herself are able to get therapy and medication management there if needed.   Psych ROS:  Depression: endorses  Anxiety:  endorses  Mania (lifetime and current): denies Psychosis: (lifetime and current): denies  Collateral information:  None, patient daughter [name] was visiting with patient   Review of Systems  Genitourinary:  Positive for dysuria, frequency and urgency.  Skin:        Bruising on right inner thigh  Neurological:  Positive for speech change.  Psychiatric/Behavioral:  Positive for depression and suicidal ideas.   All other systems reviewed and are negative.    Psychiatric and Social History  Psychiatric History:  Information collected from patient chart review and family member  Prev Dx/Sx: anxiety disorder and depression Current Psych Provider: none Home Meds (current): zoloft  and xanax  Previous Med Trials: unknown Therapy: none  Prior Psych Hospitalization: none  Prior Self Harm: none Prior Violence: none  Family Psych History: depression and anxiety in all relatives per daughter Family Hx suicide: no  Social History:  Developmental Hx: WNL  Educational Hx: unknown Occupational Hx: unemployed Armed forces operational officer Hx: none Living Situation: lives with family Spiritual Hx: none noted Access to weapons/lethal means: denies   Substance History Alcohol: UTA  Tobacco: UTA Illicit drugs: UTA Prescription drug abuse: Possible overuse of benzodiazepines per daughter Rehab hx: none  Exam Findings  Physical Exam:  Vital Signs:  Temp:  [97.8 F (36.6 C)-98.1 F (36.7 C)]  97.9 F (36.6 C) (05/02 1212) Pulse Rate:  [58-66] 66 (05/02 1212) Resp:  [17-18] 18 (05/02 1212) BP: (90-99)/(63-70) 97/63 (05/02 1212) SpO2:  [98 %-99 %] 99 % (05/02 1212) Blood pressure 97/63, pulse 66, temperature 97.9 F (36.6 C), temperature source Oral, resp. rate 18, height 5\' 4"  (1.626 m), weight 73.9 kg, SpO2 99%. Body mass index is 27.98 kg/m.  Physical Exam Vitals and nursing note reviewed.  Constitutional:      Appearance: She is obese.  HENT:     Mouth/Throat:     Comments: Tongue with thick white coating Eyes:     Pupils: Pupils are equal, round, and reactive to light.  Pulmonary:     Effort: Pulmonary effort is normal.  Skin:    General: Skin is dry.     Findings: Bruising present.  Neurological:     Mental Status: She is alert.  Psychiatric:        Mood and Affect: Mood is depressed.        Speech: Speech is slurred.        Behavior: Behavior is cooperative.  Thought Content: Thought content includes suicidal ideation.     Comments: Very garbled speech     Mental Status Exam: General Appearance: Disheveled  Orientation:  Other:  UTA fully, at least oriented to self and place  Memory:   UTA  Concentration:  Good  Recall:   UTA  Attention  Good  Eye Contact:  Good  Speech:  Clear and coherent   Language:   UTA  Volume:  Normal  Mood: euthymic  Affect:  Congruent  Thought Process:  Goal Directed  Thought Content:   UTA  Suicidal Thoughts:  Denies  Homicidal Thoughts:  No  Judgement: Fair  Insight:  Fair  Psychomotor Activity:  Decreased  Akathisia:  No  Fund of Knowledge:   UTA      Assets:  Housing Leisure Time Social Support  Cognition: intact  ADL's:  intact  AIMS (if indicated):        Other History   These have been pulled in through the EMR, reviewed, and updated if appropriate.  Family History:  The patient's family history includes Colon polyps in her sister; Depression in an other family member; Diabetes in an other  family member; Hypertension in an other family member; Other in her mother; Prostate cancer in her father.  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   Cancer (HCC)    thyroid  cancer    Cataract    forming both eyes    Colitis due to Clostridioides difficile 2007   DEPRESSION 03/17/2008   GAIT ATAXIA 06/11/2009   GERD (gastroesophageal reflux disease)    diet related    Hyperlipidemia    HYPOTHYROIDISM 03/17/2008   INSOMNIA, PERSISTENT 03/17/2008   Meige syndrome (blepharospasm with oromandibular dystonia)    OBESITY 03/17/2008   Palpitations 10/01/2008   SWEATING 03/17/2008    Surgical History: Past Surgical History:  Procedure Laterality Date   COLONOSCOPY     THYROIDECTOMY  1999   TONSILLECTOMY       Medications:   Current Facility-Administered Medications:    cephALEXin  (KEFLEX ) capsule 500 mg, 500 mg, Oral, Q8H, Dixon, Verdie Gladden, MD, 500 mg at 12/14/23 1522   hydrOXYzine  (ATARAX ) tablet 25 mg, 25 mg, Oral, TID PRN, Motley-Mangrum, Nakima Fluegge A, PMHNP   levothyroxine  (SYNTHROID ) tablet 125 mcg, 125 mcg, Oral, Q0600, Floyd, Dan, DO, 125 mcg at 12/14/23 1009   sertraline  (ZOLOFT ) tablet 100 mg, 100 mg, Oral, Daily, Weber, Kyra A, NP, 100 mg at 12/14/23 1009   traZODone  (DESYREL ) tablet 50 mg, 50 mg, Oral, QHS, Motley-Mangrum, Deirdra Heumann A, PMHNP  Current Outpatient Medications:    ADVIL  200 MG CAPS, Take 200-600 mg by mouth every 6 (six) hours as needed (for pain or headaches)., Disp: , Rfl:    alprazolam  (XANAX ) 2 MG tablet, Take 1 tablet (2 mg total) by mouth 3 (three) times daily as needed for anxiety. (Patient taking differently: Take 2 mg by mouth in the morning, at noon, and at bedtime.), Disp: 270 tablet, Rfl: 1   BENADRYL ALLERGY CHILDRENS 12.5-5 MG/5ML SOLN, Take 12.5 mg by mouth at bedtime as needed (for sleep)., Disp: , Rfl:    Carboxymeth-Glycerin-Polysorb (REFRESH OPTIVE ADVANCED  PF OP), Place 1 drop into both eyes 3 (three) times daily as needed (FOR DRYNESS)., Disp: , Rfl:    OVER THE COUNTER MEDICATION, Take 1-2 tablets by mouth See admin instructions. Advil  Dual Action tablets/caplets: Take 1-2 caplets by mouth every  8 hours as needed for headaches or pain, Disp: , Rfl:    OVER THE COUNTER MEDICATION, Take 1 capsule by mouth See admin instructions. AZO Dual Protection Urinary + Vaginal Support Capsules: Take 1 capsule by mouth once a day (prebiotic + probiotic), Disp: , Rfl:    sertraline  (ZOLOFT ) 100 MG tablet, TAKE 2 TABLETS BY MOUTH EVERY DAY, Disp: 180 tablet, Rfl: 1   SYNTHROID  125 MCG tablet, TAKE 1 TABLET BY MOUTH EVERY DAY, Disp: 90 tablet, Rfl: 3   Cholecalciferol (VITAMIN D3) 50 MCG (2000 UT) capsule, Take 1 capsule (2,000 Units total) by mouth daily., Disp: 100 capsule, Rfl: 3   EPINEPHrine  (EPIPEN  2-PAK) 0.3 mg/0.3 mL IJ SOAJ injection, Inject 0.3 mg into the muscle as needed for anaphylaxis., Disp: 2 each, Rfl: 0   Multiple Vitamins-Minerals (CENTRUM SILVER  ADULT 50+) TABS, 1 po qd, Disp: 100 tablet, Rfl: 3  Allergies: Allergies  Allergen Reactions   Ciprofloxacin Other (See Comments)    Caused colitis   Macrobid  [Nitrofurantoin ] Other (See Comments)    Reaction not cited   Ambien  [Zolpidem ] Other (See Comments)    Memory, balance issues   Cefdinir  Other (See Comments)    Made the patient feel not well   Erythromycin Other (See Comments)    GI issues   Gabapentin  Cough   Hydrocodone  Nausea And Vomiting   Tramadol  Nausea And Vomiting   Amoxicillin Rash   Penicillins Rash and Other (See Comments)    Pt can take cephalosporins ok.   Ritalin  [Methylphenidate] Palpitations    Annamae Shivley MOTLEY-MANGRUM, PMHNP

## 2023-12-14 NOTE — ED Provider Notes (Signed)
 Emergency Medicine Observation Re-evaluation Note  Linda Hayes is a 73 y.o. female, seen on rounds today.  Pt initially presented to the ED for complaints of Suicide Attempt Currently, the patient is recommended for inpatient psychiatric admission.  Awaiting placement..  Physical Exam  BP 90/64 (BP Location: Right Arm)   Pulse 60   Temp 97.8 F (36.6 C) (Oral)   Resp 17   Ht 1.626 m (5\' 4" )   Wt 73.9 kg   SpO2 98%   BMI 27.98 kg/m  Physical Exam General: Patient sleeping comfortably Cardiac:  Lungs: No obvious respiratory distress Psych: Baseline  ED Course / MDM  EKG:EKG Interpretation Date/Time:  Tuesday December 11 2023 21:57:19 EDT Ventricular Rate:  62 PR Interval:  218 QRS Duration:  95 QT Interval:  408 QTC Calculation: 415 R Axis:   -23  Text Interpretation: Sinus rhythm Multiple ventricular premature complexes Borderline prolonged PR interval Probable left atrial enlargement Borderline left axis deviation Low voltage, precordial leads Abnormal R-wave progression, early transition Confirmed by Iva Mariner [phone]) on 12/11/2023 10:27:33 PM  I have reviewed the labs performed to date as well as medications administered while in observation.  Recent changes in the last 24 hours include patient's urinary culture growing E. coli.  Sensitivities not currently back yet.  Patient has lots of allergies.  Patient was started on Keflex  2 days ago.  That seems appropriate at this time.  Plan  Current plan is for inpatient psychiatric placement.    Horice Carrero, MD 12/14/23 [phone]

## 2023-12-14 NOTE — Progress Notes (Signed)
 LCSW Progress Note  [id number]   Linda Hayes  12/14/2023  10:51 AM  Description:   Inpatient Psychiatric Referral  Patient was recommended inpatient per Lady Pier NP. There are no available beds at [facility], per [facility]room McNichol RN). Patient was referred to the following out of network facilities:   Destination  Service Provider Address Phone Fax  [facility] [address] [place], [place] [address] [phone]  [facility] [address] [place] [address] [phone]  [facility] [address] [place] [address] [phone]  [facility] [address], [place] [address] [phone]  [facility] EFAX [address] [place], [place] [phone]  [facility] [address] [place], [place] [address] [phone]  [facility] [address] [place] [address] [phone]      Situation ongoing, CSW to continue following and update chart as more information becomes available.     [place] Iyesha Such , MSW, LCSW  12/14/2023 10:51 AM

## 2023-12-15 DIAGNOSIS — T424X2A Poisoning by benzodiazepines, intentional self-harm, initial encounter: Secondary | ICD-10-CM | POA: Diagnosis not present

## 2023-12-15 LAB — SARS CORONAVIRUS 2 BY RT PCR: SARS Coronavirus 2 by RT PCR: NEGATIVE

## 2023-12-15 NOTE — Progress Notes (Signed)
 Patient has been denied by [facility] due to acuity. Patient meets [facility] inpatient criteria per [name], NP. Patient has been faxed out to the following facilities:   [facility] [address] [place], [place] [address] [phone]  [facility] [place] [address] [place] [address] [phone]  [facility] [address] [place] [address] [phone]  [facility] [address] [place] [address] [phone]  [facility] EFAX [address] [place], [place] [phone]  [facility] [address] [place], [place] [address] [phone]  [facility] [address], [place] [address] [phone]  [facility] [address], [place] [address] [phone]  [facility] [address] [place], [place] [address] [phone]  [facility] [address], [place] [address] [phone]  [facility] [address] [place] [address] [phone]  [facility] [address] [place] [address] [phone]  [facility], [place] [phone]  [facility] [address] [place] [address] [phone]  [facility] [address] [place] [address] [phone]  [facility] [address], [place] [address] [phone]  [facility] [address] [place], [place] [address] [phone]  [facility] [address] [place]  [address] [phone]   Phares Brasher, MSW, LCSW-A  3:09 PM 12/15/2023

## 2023-12-15 NOTE — Consult Note (Signed)
 Patient evaluated by psychiatry, she continues to endorse feelings of hopelessness, despair, sadness, following the loss of her spouse a few weeks ago.  She would benefit for referral for geri psych admission.  Discussed above with patient and her daughter who agrees to admission/transfer to [facility].    After discussing tx plans with patient, [facility] AC reported they are unable to accept patient today d/t change in staffing.  However, AC agrees to review for possible transfer tomorrow.   Complete Psy consult note pending.

## 2023-12-15 NOTE — ED Notes (Signed)
 Patient calm and cooperative during this shift.

## 2023-12-15 NOTE — ED Provider Notes (Signed)
 Emergency Medicine Observation Re-evaluation Note  Linda Hayes is a 73 y.o. female, seen on rounds today.  Pt initially presented to the ED for complaints of Suicide Attempt Currently, the patient is resting.  Physical Exam  BP 96/63 (BP Location: Right Arm)   Pulse 60   Temp 98.2 F (36.8 C) (Oral)   Resp 18   Ht 1.626 m (5\' 4" )   Wt 73.9 kg   SpO2 96%   BMI 27.98 kg/m  Physical Exam  ED Course / MDM  EKG:EKG Interpretation Date/Time:  Tuesday December 11 2023 21:57:19 EDT Ventricular Rate:  62 PR Interval:  218 QRS Duration:  95 QT Interval:  408 QTC Calculation: 415 R Axis:   -23  Text Interpretation: Sinus rhythm Multiple ventricular premature complexes Borderline prolonged PR interval Probable left atrial enlargement Borderline left axis deviation Low voltage, precordial leads Abnormal R-wave progression, early transition Confirmed by Iva Mariner [phone]) on 12/11/2023 10:27:33 PM  I have reviewed the labs performed to date as well as medications administered while in observation.  Recent changes in the last 24 hours include .  Plan  Current plan is for placement.    [name], MD 12/15/23 [phone]

## 2023-12-15 NOTE — Consult Note (Incomplete)
 [facility] Psychiatric Consult Initial  Patient Name: .Linda Hayes  MRN: [id number]  DOB: [birth year]  Consult Order details:  Orders (From admission, onward)     Start     Ordered   12/11/23 2349  CONSULT TO CALL ACT TEAM       Ordering Provider: Iva Mariner, MD  Provider:  (Not yet assigned)  Question:  Reason for Consult?  Answer:  suicide attempt   12/11/23 2348             Mode of Visit: In person    Psychiatry Consult Evaluation  Service Date: Dec 15, 2023 LOS:  LOS: 0 days  Chief Complaint Patient states, "I've never done anything like this before."  Primary Psychiatric Diagnoses  Suicide attempt by benzodiazepine overdose  Assessment  Linda Hayes is a 73 y.o. female admitted: Presented to the EDfor 12/11/2023  9:26 PM brought in by EMS after texting her family that she was going to end her life and overdosing on her benzodiazepines. She carries the psychiatric diagnoses of anxiety disorder and depression, and has a past medical history of  dyslipidemia, hypothyroidism, recurrent UTIs, herpes zoster, meige syndrome, esophagitis and overactive bladder.   Patient evaluated by psychiatry, she continues to endorse feelings of hopelessness, despair, sadness, following the loss of her spouse a few weeks ago.  She would benefit for referral for geri psych admission where she can engage in therapeutic milieu prior to discharge.  Patient has hx for cognitive impairment but provides excellent chronological history of present illness and no apparent memory deficits were apparent.  Discussed above with patient and her daughter who agree with admission/transfer to [facility].     After discussing tx plans with patient, [facility] AC reported they are unable to accept patient today d/t change in staffing.  However, AC agrees to review for possible transfer tomorrow.   Diagnoses:  Active Hospital problems: Principal Problem:   Suicide attempt by benzodiazepine overdose (HCC) Active  Problems:   Anxiety disorder    Plan   ## Psychiatric Medication Recommendations:  No medication changes today; Continue Zoloft  100mg  po daily for depression  Give trazodone  50 mg as needed for sleep Give Atarax  25 mg p.o. 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 -- most recent EKG on 12/12/2023 had QtC of 415 -- Pertinent labwork reviewed earlier this admission includes: CBC, CMP, acetaminophen , salicylate, TSH, UA, alcohol and UDS   ## Disposition:-Patient is recommended for inpatient admission. [facility] AC will review her for possible acceptance to [facility] tomorrow, contingent on staffing.    ## Behavioral / Environmental: -Delirium Precautions: Delirium Interventions for Nursing and Staff: - RN to open blinds every AM. - To Bedside: Glasses, hearing aide, and pt's own shoes. Make available to patients. when possible and encourage use. - Encourage po fluids when appropriate, keep fluids within reach. - OOB to chair with meals. - Passive ROM exercises to all extremities with AM & PM care. - RN to assess orientation to person, time and place QAM and PRN. - Recommend extended visitation hours with familiar family/friends as feasible. - Staff to minimize disturbances at night. Turn off television when pt asleep or when not in use.    ## Safety and Observation Level:  - Based on my clinical evaluation, I estimate the patient  to be at high risk of self harm in the current setting. - At this time, we recommend  1:1 Observation. This decision is based on my review of the chart including patient's history and current presentation, interview of the patient, mental status examination, and consideration of suicide risk including evaluating suicidal ideation, plan, intent, suicidal or self-harm behaviors, risk factors,  and protective factors. This judgment is based on our ability to directly address suicide risk, implement suicide prevention strategies, and develop a safety plan while the patient is in the clinical setting. Please contact our team if there is a concern that risk level has changed.  CSSR Risk Category:C-SSRS RISK CATEGORY: High Risk  Suicide Risk Assessment: Patient has following modifiable risk factors for suicide: None; we will monitor patient overnight for possible discharge tomorrow on 12/15/2023. Patient has following non-modifiable or demographic risk factors for suicide: History of suicide attempt  patient has the following protective factors against suicide: Supportive family  Thank you for this consult request. Recommendations have been communicated to the primary team.  We will continue to follow at this time.   Doneen Fuelling, NP       History of Present Illness  Relevant Aspects of Hospital ED Course:  Admitted on 12/11/2023 brought in by EMS after texting her family that she was going to end her life and overdosing on her benzodiazepines. She carries the psychiatric diagnoses of anxiety disorder and depression, and has a past medical history of  dyslipidemia, hypothyroidism, recurrent UTIs, herpes zoster, meige syndrome, esophagitis and overactive bladder.    Patient Report:  Patient report she was grieving the recent passing of her spouse of 36 years who recently passed away. She reports he's been living with her daughter, who she remarks has ADHD and is very meticulous.  She reports her daughter was upset with her because she could smell the onions on her salad.  Patient reports she tried to go to another room  Psych ROS:  Depression: endorses  Anxiety:  endorses  Mania (lifetime and current): denies Psychosis: (lifetime and current): denies  Collateral information:  None, patient daughter [name] was visiting with patient   Review of Systems  Genitourinary:  Positive  for dysuria, frequency and urgency.  Skin:        Bruising on right inner thigh  Psychiatric/Behavioral:  Positive for depression and suicidal ideas. The patient is nervous/anxious.   All other systems reviewed and are negative.    Psychiatric and Social History  Psychiatric History:  Information collected from patient chart review and family member  Prev Dx/Sx: anxiety disorder and depression Current Psych Provider: none Home Meds (current): zoloft  and xanax  Previous Med Trials: unknown Therapy: none  Prior Psych Hospitalization: none  Prior Self Harm: none Prior Violence: none  Family Psych History: depression and anxiety in all relatives per daughter Family Hx suicide: no  Social History:  Developmental Hx: WNL  Educational Hx: unknown Occupational Hx: unemployed Armed forces operational officer Hx: none Living Situation: lives with family Spiritual Hx: none noted Access to weapons/lethal means: denies   Substance History Alcohol: UTA  Tobacco: UTA Illicit drugs: UTA Prescription drug abuse: Possible overuse of benzodiazepines per daughter Rehab hx: none  Exam Findings  Physical Exam:  Vital Signs:  Temp:  [98.2 F (36.8 C)-98.4 F (36.9 C)] 98.3 F (36.8 C) (05/03 1901) Pulse Rate:  [60-67] 66 (05/03 1901) Resp:  [17-18] 17 (05/03 1901) BP: (96-100)/(63-66) 97/66 (05/03 1901) SpO2:  [96 %-98 %] 98 % (05/03 1901) Blood  pressure 97/66, pulse 66, temperature 98.3 F (36.8 C), temperature source Oral, resp. rate 17, height 5\' 4"  (1.626 m), weight 73.9 kg, SpO2 98%. Body mass index is 27.98 kg/m.  Physical Exam Vitals and nursing note reviewed.  Constitutional:      Appearance: She is obese.  HENT:     Mouth/Throat:     Comments: Tongue with thick white coating Eyes:     Pupils: Pupils are equal, round, and reactive to light.  Pulmonary:     Effort: Pulmonary effort is normal.  Musculoskeletal:        General: Normal range of motion.  Skin:    General: Skin is dry.      Findings: Bruising present.  Neurological:     Mental Status: She is alert and oriented to person, place, and time. Mental status is at baseline.  Psychiatric:        Attention and Perception: Attention normal.        Mood and Affect: Mood is depressed. Affect is labile and tearful.        Speech: Speech normal.        Behavior: Behavior is cooperative.        Thought Content: Thought content includes suicidal ideation.        Judgment: Judgment is impulsive.     Comments: Very garbled speech     Mental Status Exam: General Appearance: Disheveled  Orientation:  Full (Time, Place, and Person)  Memory:   good  Concentration:  Good  Recall:  good  Attention  Good  Eye Contact:  Good  Speech:  Clear and coherent   Language:   good  Volume:  Normal  Mood: depressed   Affect:  Congruent, tearful and labile mood  Thought Process:  Goal Directed  Thought Content:  coherent,   Suicidal Thoughts:  Denies but patient is here following suicide attempt  Homicidal Thoughts:  No  Judgement: impulsive  Insight:  Fair  Psychomotor Activity:  Decreased  Akathisia:  No  Fund of Knowledge:  good      Assets:  Housing Leisure Time Social Support  Cognition: intact  ADL's:  intact  AIMS (if indicated):        Other History   These have been pulled in through the EMR, reviewed, and updated if appropriate.  Family History:  The patient's family history includes Colon polyps in her sister; Depression in an other family member; Diabetes in an other family member; Hypertension in an other family member; Other in her mother; Prostate cancer in her father.  Medical History: Past Medical History:  Diagnosis Date  . ANXIETY 03/17/2008  . ASTHMATIC BRONCHITIS, ACUTE 10/01/2008  . 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 Cancer (HCC)    thyroid  cancer   . Cataract    forming both eyes   . Colitis due to Clostridioides difficile 2007  . DEPRESSION 03/17/2008  . GAIT ATAXIA 06/11/2009  . GERD  (gastroesophageal reflux disease)    diet related   . Hyperlipidemia   . HYPOTHYROIDISM 03/17/2008  . INSOMNIA, PERSISTENT 03/17/2008  . Meige syndrome (blepharospasm with oromandibular dystonia)   . OBESITY 03/17/2008  . Palpitations 10/01/2008  . SWEATING 03/17/2008    Surgical History: Past Surgical History:  Procedure Laterality Date  . COLONOSCOPY    . THYROIDECTOMY  1999  . TONSILLECTOMY       Medications:   Current Facility-Administered Medications:  .  cephALEXin  (KEFLEX ) capsule 500 mg, 500 mg, Oral, Q8H, Iva Mariner, MD, 500 mg  at 12/15/23 2113 .  hydrOXYzine  (ATARAX ) tablet 25 mg, 25 mg, Oral, TID PRN, Motley-Mangrum, Jadeka A, PMHNP, 25 mg at 12/15/23 2113 .  levothyroxine  (SYNTHROID ) tablet 125 mcg, 125 mcg, Oral, Q0600, Albertus Hughs, DO, 125 mcg at 12/15/23 1610 .  sertraline  (ZOLOFT ) tablet 100 mg, 100 mg, Oral, Daily, Weber, Kyra A, NP, 100 mg at 12/15/23 1215 .  traZODone  (DESYREL ) tablet 50 mg, 50 mg, Oral, QHS, Motley-Mangrum, Jadeka A, PMHNP, 50 mg at 12/15/23 2113  Current Outpatient Medications:  .  ADVIL  200 MG CAPS, Take 200-600 mg by mouth every 6 (six) hours as needed (for pain or headaches)., Disp: , Rfl:  .  alprazolam  (XANAX ) 2 MG tablet, Take 1 tablet (2 mg total) by mouth 3 (three) times daily as needed for anxiety. (Patient taking differently: Take 2 mg by mouth in the morning, at noon, and at bedtime.), Disp: 270 tablet, Rfl: 1 .  BENADRYL ALLERGY CHILDRENS 12.5-5 MG/5ML SOLN, Take 12.5 mg by mouth at bedtime as needed (for sleep)., Disp: , Rfl:  .  Carboxymeth-Glycerin-Polysorb (REFRESH OPTIVE ADVANCED PF OP), Place 1 drop into both eyes 3 (three) times daily as needed (FOR DRYNESS)., Disp: , Rfl:  .  OVER THE COUNTER MEDICATION, Take 1-2 tablets by mouth See admin instructions. Advil  Dual Action tablets/caplets: Take 1-2 caplets by mouth every 8 hours as needed for headaches or pain, Disp: , Rfl:  .  OVER THE COUNTER MEDICATION, Take 1 capsule by mouth See  admin instructions. AZO Dual Protection Urinary + Vaginal Support Capsules: Take 1 capsule by mouth once a day (prebiotic + probiotic), Disp: , Rfl:  .  sertraline  (ZOLOFT ) 100 MG tablet, TAKE 2 TABLETS BY MOUTH EVERY DAY, Disp: 180 tablet, Rfl: 1 .  SYNTHROID  125 MCG tablet, TAKE 1 TABLET BY MOUTH EVERY DAY, Disp: 90 tablet, Rfl: 3 .  Cholecalciferol (VITAMIN D3) 50 MCG (2000 UT) capsule, Take 1 capsule (2,000 Units total) by mouth daily., Disp: 100 capsule, Rfl: 3 .  EPINEPHrine  (EPIPEN  2-PAK) 0.3 mg/0.3 mL IJ SOAJ injection, Inject 0.3 mg into the muscle as needed for anaphylaxis., Disp: 2 each, Rfl: 0 .  Multiple Vitamins-Minerals (CENTRUM SILVER  ADULT 50+) TABS, 1 po qd, Disp: 100 tablet, Rfl: 3  Allergies: Allergies  Allergen Reactions  . Ciprofloxacin Other (See Comments)    Caused colitis  . Macrobid  [Nitrofurantoin ] Other (See Comments)    Reaction not cited  . Ambien  [Zolpidem ] Other (See Comments)    Memory, balance issues  . Cefdinir  Other (See Comments)    Made the patient feel not well  . Erythromycin Other (See Comments)    GI issues  . Gabapentin  Cough  . Hydrocodone  Nausea And Vomiting  . Tramadol  Nausea And Vomiting  . Amoxicillin Rash  . Penicillins Rash and Other (See Comments)    Pt can take cephalosporins ok.  . Ritalin  [Methylphenidate] Palpitations    Myli Pae E Uri Turnbough, NP

## 2023-12-15 NOTE — Consult Note (Signed)
 Attempted to see patient for psych assessment, sent message to RN Winslow Hawk, and awaiting response.

## 2023-12-15 NOTE — ED Notes (Signed)
 Patient calm and cooperative, family visiting at this time.

## 2023-12-15 NOTE — ED Notes (Signed)
 Mrs. Coomer awoke this AM and stated " that little green pill you gave me really worked I sleep all night".

## 2023-12-15 NOTE — Consult Note (Addendum)
 [facility] Psychiatric Consult Initial  Patient Name: .Linda Hayes  MRN: [id number]  DOB: [birth year]  Consult Order details:  Orders (From admission, onward)     Start     Ordered   12/11/23 2349  CONSULT TO CALL ACT TEAM       Ordering Provider: Iva Mariner, MD  Provider:  (Not yet assigned)  Question:  Reason for Consult?  Answer:  suicide attempt   12/11/23 2348             Mode of Visit: Tele-visit Virtual Statement:TELE PSYCHIATRY ATTESTATION & CONSENT As the provider for this telehealth consult, I attest that I verified the patient's identity using two separate identifiers, introduced myself to the patient, provided my credentials, disclosed my location, and performed this encounter via a HIPAA-compliant, real-time, face-to-face, two-way, interactive audio and video platform and with the full consent and agreement of the patient (or guardian as applicable.) Patient physical location: [facility]. Telehealth provider physical location: home office in state of [place].   Video start time: 1430 Video end time: 1305    Psychiatry Consult Evaluation  Service Date: Dec 15, 2023 LOS:  LOS: 0 days  Chief Complaint Patient states, "I've never done anything like this before."  Primary Psychiatric Diagnoses  Suicide attempt by benzodiazepine overdose  Assessment  Linda Hayes is a 73 y.o. female admitted: Presented to the EDfor 12/11/2023  9:26 PM brought in by EMS after texting her family that she was going to end her life and overdosing on her benzodiazepines. She carries the psychiatric diagnoses of anxiety disorder and depression, and has a past medical history of  dyslipidemia, hypothyroidism, recurrent UTIs, herpes zoster, meige syndrome, esophagitis and overactive bladder.   Considering her medical/mental health concerns causing barriers to psychiatric admission, psychiatry was attempting to work with patient and her daughter for safety planning and discharge home to follow up  with outpatient resources.   However, [facility] AC requested pt complete covid testing today to evaluate accepting her for geri psych admission.   Patient re-evaluated by psychiatry today. She continues to endorse feelings of hopelessness, despair, sadness, following the loss of her spouse a few weeks ago.  She would benefit for referral for geri psych admission where she can engage in therapeutic milieu prior to discharge.  Patient has hx for cognitive impairment but provides excellent chronological history of present illness and no apparent memory deficits.  Discussed above with patient and her daughter who agree with admission/transfer to [facility].     After discussing tx plans with patient, [facility] reported they are unable to accept patient today d/t change in staffing.  However, AC agrees to review for possible transfer tomorrow.   Diagnoses:  Active Hospital problems: Principal Problem:   Suicide attempt by benzodiazepine overdose (HCC) Active Problems:   Anxiety disorder    Plan   ## Psychiatric Medication Recommendations:  No medication changes today; Continue Zoloft  100mg  po daily for depression  Give trazodone  50 mg as needed for sleep Give Atarax  25 mg p.o. 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 -- most recent EKG on 12/12/2023 had QtC of 415 -- Pertinent labwork reviewed earlier this admission includes: CBC, CMP, acetaminophen , salicylate, TSH, UA, alcohol and UDS   ## Disposition:-Patient is recommended for inpatient admission. [facility]  AC will review her for possible acceptance to [facility] tomorrow, contingent on staffing.    ## Behavioral / Environmental: -Delirium Precautions: Delirium Interventions for Nursing and Staff: - RN to open blinds every AM. - To Bedside: Glasses, hearing aide, and pt's  own shoes. Make available to patients. when possible and encourage use. - Encourage po fluids when appropriate, keep fluids within reach. - OOB to chair with meals. - Passive ROM exercises to all extremities with AM & PM care. - RN to assess orientation to person, time and place QAM and PRN. - Recommend extended visitation hours with familiar family/friends as feasible. - Staff to minimize disturbances at night. Turn off television when pt asleep or when not in use.    ## Safety and Observation Level:  - Based on my clinical evaluation, I estimate the patient to be at high risk of self harm in the current setting. - At this time, we recommend  1:1 Observation. This decision is based on my review of the chart including patient's history and current presentation, interview of the patient, mental status examination, and consideration of suicide risk including evaluating suicidal ideation, plan, intent, suicidal or self-harm behaviors, risk factors, and protective factors. This judgment is based on our ability to directly address suicide risk, implement suicide prevention strategies, and develop a safety plan while the patient is in the clinical setting. Please contact our team if there is a concern that risk level has changed.  CSSR Risk Category:C-SSRS RISK CATEGORY: High Risk  Suicide Risk Assessment: Patient has following modifiable risk factors for suicide: None; we will monitor patient overnight for possible discharge tomorrow on 12/15/2023. Patient has following non-modifiable or demographic risk factors for suicide: History of suicide attempt  patient has the following protective factors against suicide: Supportive family  Thank you for this consult request. Recommendations have been communicated to the primary team.  We will continue to follow at this time.   Doneen Fuelling, NP       History of Present Illness  Relevant Aspects of Hospital ED Course:  Admitted on 12/11/2023 brought in by EMS  after texting her family that she was going to end her life and overdosing on her benzodiazepines. She carries the psychiatric diagnoses of anxiety disorder and depression, and has a past medical history of  dyslipidemia, hypothyroidism, recurrent UTIs, herpes zoster, meige syndrome, esophagitis and overactive bladder.    Patient Report:  Patient report she was grieving the recent passing of her spouse of 36 years who recently passed away. She reports she's been living with her daughter, who she remarks has ADHD and is very meticulous.  Prior to admission, She reports her daughter was upset with her because she could smell the onions on her salad.  Patient reports she tried to go to another room but her daughter followed her and continued to complain about the smell of onions.  Patient states after a verbal exchange with her daughter, she felt "trapped and just wished she would give me some space."  She reports she attempted to overdose on xanax  because she needed an escape.  She does lament over her decision and states she has never engaged in suicidal behavior before.  She states she has worked in [employer] her entire life and has prided herself in her ability to get along with others.   Today she denies suicidal thoughts but is tearful and provides circumstantial information regarding how losing her husband and companion has impacted her life.  Psych ROS:  Depression: endorses  Anxiety:  endorses  Mania (lifetime and current): denies Psychosis: (lifetime and current): denies  Collateral information:  None, patient daughter [name] was visiting with patient   Review of Systems  Genitourinary:  Positive for dysuria, frequency and urgency.  Skin:        Bruising on right inner thigh  Psychiatric/Behavioral:  Positive for depression and suicidal ideas. The patient is nervous/anxious.   All other systems reviewed and are negative.    Psychiatric and Social History  Psychiatric  History:  Information collected from patient chart review and family member  Prev Dx/Sx: anxiety disorder and depression Current Psych Provider: none Home Meds (current): zoloft  and xanax  Previous Med Trials: unknown Therapy: none  Prior Psych Hospitalization: none  Prior Self Harm: none Prior Violence: none  Family Psych History: depression and anxiety in all relatives per daughter Family Hx suicide: no  Social History:  Developmental Hx: WNL  Educational Hx: unknown Occupational Hx: unemployed Armed forces operational officer Hx: none Living Situation: lives with family Spiritual Hx: none noted Access to weapons/lethal means: denies   Substance History Alcohol: denies Tobacco:denies  Illicit drugs: denies Prescription drug abuse: Possible overuse of benzodiazepines per daughter Rehab hx: none  Exam Findings  Physical Exam:  Vital Signs:  Temp:  [98.2 F (36.8 C)-98.4 F (36.9 C)] 98.3 F (36.8 C) (05/03 1901) Pulse Rate:  [60-67] 66 (05/03 1901) Resp:  [17-18] 17 (05/03 1901) BP: (96-100)/(63-66) 97/66 (05/03 1901) SpO2:  [96 %-98 %] 98 % (05/03 1901) Blood pressure 97/66, pulse 66, temperature 98.3 F (36.8 C), temperature source Oral, resp. rate 17, height 5\' 4"  (1.626 m), weight 73.9 kg, SpO2 98%. Body mass index is 27.98 kg/m.  Physical Exam Vitals and nursing note reviewed.  Constitutional:      Appearance: She is obese.  HENT:     Mouth/Throat:     Comments: Tongue with thick white coating Eyes:     Pupils: Pupils are equal, round, and reactive to light.  Pulmonary:     Effort: Pulmonary effort is normal.  Musculoskeletal:        General: Normal range of motion.  Skin:    General: Skin is dry.     Findings: Bruising present.  Neurological:     Mental Status: She is alert and oriented to person, place, and time. Mental status is at baseline.  Psychiatric:        Attention and Perception: Attention normal.        Mood and Affect: Mood is depressed. Affect is labile and  tearful.        Speech: Speech normal.        Behavior: Behavior is cooperative.        Thought Content: Thought content includes suicidal ideation.        Judgment: Judgment is impulsive.     Comments: Very garbled speech     Mental Status Exam: General Appearance: Disheveled  Orientation:  Full (Time, Place, and Person)  Memory:   good  Concentration:  Good  Recall:  good  Attention  Good  Eye Contact:  Good  Speech:  Clear and coherent   Language:   good  Volume:  Normal  Mood: depressed   Affect:  Congruent, tearful and labile mood  Thought Process:  Goal Directed  Thought Content:  coherent,   Suicidal Thoughts:  Denies but patient is here following suicide attempt  Homicidal Thoughts:  No  Judgement: impulsive  Insight:  Fair  Psychomotor  Activity:  Decreased  Akathisia:  No  Fund of Knowledge:  good      Assets:  Housing Leisure Time Social Support  Cognition: intact  ADL's:  intact  AIMS (if indicated):        Other History   These have been pulled in through the EMR, reviewed, and updated if appropriate.  Family History:  The patient's family history includes Colon polyps in her sister; Depression in an other family member; Diabetes in an other family member; Hypertension in an other family member; Other in her mother; Prostate cancer in her father.  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   Cancer (HCC)    thyroid  cancer    Cataract    forming both eyes    Colitis due to Clostridioides difficile 2007   DEPRESSION 03/17/2008   GAIT ATAXIA 06/11/2009   GERD (gastroesophageal reflux disease)    diet related    Hyperlipidemia    HYPOTHYROIDISM 03/17/2008   INSOMNIA, PERSISTENT 03/17/2008   Meige syndrome (blepharospasm with oromandibular dystonia)    OBESITY 03/17/2008   Palpitations 10/01/2008   SWEATING 03/17/2008    Surgical History: Past Surgical  History:  Procedure Laterality Date   COLONOSCOPY     THYROIDECTOMY  1999   TONSILLECTOMY       Medications:   Current Facility-Administered Medications:    cephALEXin  (KEFLEX ) capsule 500 mg, 500 mg, Oral, Q8H, Dixon, Ryan, MD, 500 mg at 12/15/23 2113   hydrOXYzine  (ATARAX ) tablet 25 mg, 25 mg, Oral, TID PRN, Motley-Mangrum, Jadeka A, PMHNP, 25 mg at 12/15/23 2113   levothyroxine  (SYNTHROID ) tablet 125 mcg, 125 mcg, Oral, Q0600, Floyd, Dan, DO, 125 mcg at 12/15/23 0618   sertraline  (ZOLOFT ) tablet 100 mg, 100 mg, Oral, Daily, Weber, Kyra A, NP, 100 mg at 12/15/23 1215   traZODone  (DESYREL ) tablet 50 mg, 50 mg, Oral, QHS, Motley-Mangrum, Jadeka A, PMHNP, 50 mg at 12/15/23 2113  Current Outpatient Medications:    ADVIL  200 MG CAPS, Take 200-600 mg by mouth every 6 (six) hours as needed (for pain or headaches)., Disp: , Rfl:    alprazolam  (XANAX ) 2 MG tablet, Take 1 tablet (2 mg total) by mouth 3 (three) times daily as needed for anxiety. (Patient taking differently: Take 2 mg by mouth in the morning, at noon, and at bedtime.), Disp: 270 tablet, Rfl: 1   BENADRYL ALLERGY CHILDRENS 12.5-5 MG/5ML SOLN, Take 12.5 mg by mouth at bedtime as needed (for sleep)., Disp: , Rfl:    Carboxymeth-Glycerin-Polysorb (REFRESH OPTIVE ADVANCED PF OP), Place 1 drop into both eyes 3 (three) times daily as needed (FOR DRYNESS)., Disp: , Rfl:    OVER THE COUNTER MEDICATION, Take 1-2 tablets by mouth See admin instructions. Advil  Dual Action tablets/caplets: Take 1-2 caplets by mouth every 8 hours as needed for headaches or pain, Disp: , Rfl:    OVER THE COUNTER MEDICATION, Take 1 capsule by mouth See admin instructions. AZO Dual Protection Urinary + Vaginal Support Capsules: Take 1 capsule by mouth once a day (prebiotic + probiotic), Disp: , Rfl:    sertraline  (ZOLOFT ) 100 MG tablet, TAKE 2 TABLETS BY MOUTH EVERY DAY, Disp: 180 tablet, Rfl: 1   SYNTHROID  125 MCG tablet, TAKE 1 TABLET BY MOUTH EVERY DAY, Disp: 90  tablet, Rfl: 3   Cholecalciferol (VITAMIN D3) 50 MCG (2000 UT) capsule, Take 1 capsule (2,000 Units total) by mouth daily., Disp: 100  capsule, Rfl: 3   EPINEPHrine  (EPIPEN  2-PAK) 0.3 mg/0.3 mL IJ SOAJ injection, Inject 0.3 mg into the muscle as needed for anaphylaxis., Disp: 2 each, Rfl: 0   Multiple Vitamins-Minerals (CENTRUM SILVER  ADULT 50+) TABS, 1 po qd, Disp: 100 tablet, Rfl: 3  Allergies: Allergies  Allergen Reactions   Ciprofloxacin Other (See Comments)    Caused colitis   Macrobid  [Nitrofurantoin ] Other (See Comments)    Reaction not cited   Ambien  [Zolpidem ] Other (See Comments)    Memory, balance issues   Cefdinir  Other (See Comments)    Made the patient feel not well   Erythromycin Other (See Comments)    GI issues   Gabapentin  Cough   Hydrocodone  Nausea And Vomiting   Tramadol  Nausea And Vomiting   Amoxicillin Rash   Penicillins Rash and Other (See Comments)    Pt can take cephalosporins ok.   Ritalin  [Methylphenidate] Palpitations    Doneen Fuelling, NP

## 2023-12-15 NOTE — ED Notes (Signed)
 Trazadone given about 21:20 for anxiety and sleep pt reports ineffective and request something more for sleep, Atarax  given.

## 2023-12-16 NOTE — Progress Notes (Signed)
 Patient has been denied by [facility] due to no appropriate beds available. Patient meets [facility] inpatient criteria per [name], NP. Patient has been faxed out to the following facilities:   [facility] [address] [place], [place] [address] [phone]  [facility] [address] [place] [address] [phone]  [facility] [address], [place] [address] [phone]  [facility] [address] [place] [address] [phone]  [facility] EFAX [address] [place], [place] [phone]  [facility] [address] [place], [place] [address] [phone]  [facility] [address], [place] [address] [phone]  [facility] [address], [place] [address] [phone]  [facility] [address] [place], [place] [address] [phone]  [facility] [address], [place] [address] [phone]  [facility] [address] [place] [address] [phone]  [facility] [address] [place] [address] [phone]  [facility] Pl[facility], [place] [phone]  [facility] [address] [place] [address] [phone]  [facility] [address] [place] [address] [phone]  [facility] [address] [place] [address] [phone]  [facility] [address] [place], [place] [address] [phone]  [facility] [address] [place] [address] [phone]   Phares Brasher, MSW, LCSW-A  10:59 AM 12/16/2023

## 2023-12-16 NOTE — ED Notes (Signed)
 Patient resting in bed.

## 2023-12-16 NOTE — ED Provider Notes (Addendum)
 Emergency Medicine Observation Re-evaluation Note  Linda Hayes is a 73 y.o. female, seen on rounds today.  Pt initially presented to the ED for complaints of Suicide Attempt Currently, the patient is sleeping.  Physical Exam  BP (!) 118/97 (BP Location: Right Arm)   Pulse (!) 57   Temp 98.4 F (36.9 C)   Resp 14   Ht 1.626 m (5\' 4" )   Wt 73.9 kg   SpO2 94%   BMI 27.98 kg/m  Physical Exam   ED Course / MDM  EKG:EKG Interpretation Date/Time:  Tuesday December 11 2023 21:57:19 EDT Ventricular Rate:  62 PR Interval:  218 QRS Duration:  95 QT Interval:  408 QTC Calculation: 415 R Axis:   -23  Text Interpretation: Sinus rhythm Multiple ventricular premature complexes Borderline prolonged PR interval Probable left atrial enlargement Borderline left axis deviation Low voltage, precordial leads Abnormal R-wave progression, early transition Confirmed by Iva Mariner [phone]) on 12/11/2023 10:27:33 PM  I have reviewed the labs performed to date as well as medications administered while in observation.  Recent changes in the last 24 hours include urinalysis came out positive for E. coli sensitive to Keflex .  She is already on that medication..  Plan  Patient meets criteria to be placed at [facility] however they are understaffed.  Hopefully she can go there today or tomorrow    [name], MD 12/16/23 0805    [name], MD 12/16/23 [phone]

## 2023-12-17 DIAGNOSIS — T424X2A Poisoning by benzodiazepines, intentional self-harm, initial encounter: Secondary | ICD-10-CM | POA: Diagnosis not present

## 2023-12-17 MED ORDER — ACETAMINOPHEN 160 MG/5ML PO SOLN
650.0000 mg | Freq: Once | ORAL | Status: DC
  Filled 2023-12-17: qty 20.3

## 2023-12-17 MED ORDER — FLUOXETINE HCL 10 MG PO CAPS: 10.0000 mg | ORAL_CAPSULE | Freq: Every day | ORAL | Status: DC

## 2023-12-17 MED ORDER — SERTRALINE HCL 50 MG PO TABS
50.0000 mg | ORAL_TABLET | Freq: Every day | ORAL | Status: DC
  Administered 2023-12-18: 50 mg via ORAL
  Filled 2023-12-17: qty 1

## 2023-12-17 MED ORDER — FLUOXETINE HCL 10 MG PO CAPS
10.0000 mg | ORAL_CAPSULE | Freq: Every day | ORAL | Status: DC
  Administered 2023-12-18: 10 mg via ORAL
  Filled 2023-12-17: qty 1

## 2023-12-17 MED ORDER — ACETAMINOPHEN 325 MG PO TABS
ORAL_TABLET | ORAL | Status: AC
Start: 2023-12-17 — End: 2023-12-17
  Administered 2023-12-17: 650 mg
  Filled 2023-12-17: qty 2

## 2023-12-17 NOTE — ED Provider Notes (Signed)
 Emergency Medicine Observation Re-evaluation Note  Linda Hayes is a 73 y.o. female, seen on rounds today.  Pt initially presented to the ED for complaints of Suicide Attempt Currently, the patient is asleep.  Physical Exam  BP 105/71 (BP Location: Right Arm)   Pulse 60   Temp 97.8 F (36.6 C) (Oral)   Resp 16   Ht 5\' 4"  (1.626 m)   Wt 73.9 kg   SpO2 98%   BMI 27.98 kg/m  Physical Exam General: No acute distress Cardiac: Regular rate Lungs: No respiratory distress Psych: Currently calm  ED Course / MDM  EKG:EKG Interpretation Date/Time:  Tuesday December 11 2023 21:57:19 EDT Ventricular Rate:  62 PR Interval:  218 QRS Duration:  95 QT Interval:  408 QTC Calculation: 415 R Axis:   -23  Text Interpretation: Sinus rhythm Multiple ventricular premature complexes Borderline prolonged PR interval Probable left atrial enlargement Borderline left axis deviation Low voltage, precordial leads Abnormal R-wave progression, early transition Confirmed by Iva Mariner [phone]) on 12/11/2023 10:27:33 PM  I have reviewed the labs performed to date as well as medications administered while in observation.  Recent changes in the last 24 hours include .  Plan  Current plan is for asleep. Needs admission for psychiatric stabilization.    Deatra Face, MD 12/17/23 [phone]

## 2023-12-17 NOTE — ED Notes (Signed)
 [facility] spoke with pts daughter [name] to inquire about pts recent gene test for withdrawal from xanax  and pt possibly being discharged. Pts daughter reports that the gene test results recommend that pt be cross tapered with zoloft . Pts daughter reports that when she spoke with someone over the weekend here in the [facility] ED she was told that pt was being reviewed for admission to [facility]. Per [facility] this morning, [facility] is capacity and can't accept pt. [facility] explained to pts daughter that pt has been faxed out since soon after arriving but has not been picked up by an inpatient psychiatric facility. Pts daughter expressed concerns over pt being discharged home due her withdrawal from xanax .  Patt Boozer, [facility]  12/17/23

## 2023-12-17 NOTE — Progress Notes (Signed)
 LCSW Progress Note  [id number]   Linda Hayes  12/17/2023  1:38 PM  Description:   Inpatient Psychiatric Referral  Patient was recommended inpatient per Arvell Latin NP. There are [facility] beds at [facility], per [facility] Kathryn Parish RN. Patient was referred to the following out of network facilities:   Destination  Service Provider Address Phone Fax  [facility] [address] [place], [place] [address] [phone]  [facility] [address] [place] [address] [phone]  [facility] [address] [place] [address] [phone]  [facility] [address], [place] [address] [phone]  [facility] EFAX [address] [place], [place] [phone]  [facility] [address] [place], [place] [address] [phone]  [facility] [address], [place] [address] [phone]  [facility] [address], [place] [address] [phone]  [facility] [address] [place], [place] [address] [phone]  [facility] [address], [place] [address] [phone]  [facility] [address] [place] [address] [phone]  [facility] [address] [place] [address] [phone]  [facility] Pl[facility], [place] [phone]  [facility] [address] [place] [address] [phone]  [facility] [address] [place] [address] [phone]  [facility] [address]  [place] [address] [phone]  [facility] [address] [place], [place] [address] [phone]  [facility] [address] [place] [address] [phone]      Situation ongoing, CSW to continue following and update chart as more information becomes available.      [place] Khi Mcmillen, MSW, LCSW  12/17/2023 1:38 PM

## 2023-12-17 NOTE — Telephone Encounter (Signed)
 Copied from CRM [id number]. Topic: Clinical - Lab/Test Results >> Dec 17, 2023  9:17 AM Ovid Blow wrote: Reason for CRM: Patient's daughter Ginnie Laine called wanting to check status of lab results for patient. Informed daughter of notes left by FNP Hershel Los for patient. AMANDA ESTES needs clarification of the directions to cross-taper. Please contact AMANDA ESTES @ [phone]

## 2023-12-17 NOTE — ED Notes (Signed)
 Patient has remained calm, cooperative, and pleasant throughout shift. Patient is able to make needs known and assist with ADL's.

## 2023-12-17 NOTE — ED Notes (Signed)
 PT walking pt around.  PT stopped and advised me that pt was telling her she has not had a BM since she has been admitted here.  I was assigned back here the other day and remember pt went to bathroom for a BM.  Not sure pt is a good historian about this.

## 2023-12-17 NOTE — Consult Note (Cosign Needed Addendum)
 [facility] Consult Follow-up  Patient Name: .Linda Hayes  MRN: [id number]  DOB: [birth year]  Consult Order details:  Orders (From admission, onward)     Start     Ordered   12/11/23 2349  CONSULT TO CALL ACT TEAM       Ordering Provider: Iva Mariner, MD  Provider:  (Not yet assigned)  Question:  Reason for Consult?  Answer:  suicide attempt   12/11/23 2348             Mode of Visit: Face to face    Psychiatry Consult Evaluation  Service Date: Dec 17, 2023 LOS:  LOS: 0 days  Chief Complaint Patient states, "I've never done anything like this before."  Primary Psychiatric Diagnoses  Suicide attempt by benzodiazepine overdose  Assessment  Linda Hayes is a 73 y.o. female admitted: Presented to the EDfor 12/11/2023  9:26 PM brought in by EMS after texting her family that she was going to end her life and overdosing on her benzodiazepines. She carries the psychiatric diagnoses of anxiety disorder and depression, and has a past medical history of  dyslipidemia, hypothyroidism, recurrent UTIs, herpes zoster, meige syndrome, esophagitis and overactive bladder.   Considering her medical/mental health concerns causing barriers to psychiatric admission, psychiatry was attempting to work with patient and her daughter for safety planning and discharge home to follow up with outpatient resources.   However, [facility] requested pt complete covid testing today to evaluate accepting her for geri psych admission.  Today's evaluation reveals that no hospital is accepting patient due to Medical needs and need for assistance with ADLS.  Patient has been faxed out to several hospitals but we have not received any acceptance.  We will continue to treat and medicate and manage medications until we either get a bed or discharge her home.   Diagnoses:  Active Hospital problems: Principal Problem:   Suicide attempt by benzodiazepine overdose (HCC) Active Problems:   Anxiety disorder     Plan   ## Psychiatric Medication Recommendations:  No medication changes today; Continue Zoloft  100mg  po daily for depression  Continue  trazodone  50 mg as needed for sleep Continue  Atarax  25 mg p.o. 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 -- most recent EKG on 12/12/2023 had QtC of 415 -- Pertinent labwork reviewed earlier this admission includes: CBC, CMP, acetaminophen , salicylate, TSH, UA, alcohol and UDS   ## Disposition:-Patient is recommended for inpatient admission. [facility] will review her for possible acceptance to [facility] tomorrow, contingent on staffing.    ## Behavioral / Environmental: -Delirium Precautions: Delirium Interventions for Nursing and Staff: - RN to open blinds every AM. - To Bedside: Glasses, hearing aide, and pt's own shoes. Make available to patients. when possible and encourage use. - Encourage po fluids when appropriate, keep fluids within reach. - OOB to chair with meals. - Passive ROM exercises to all extremities with AM & PM care. - RN to assess orientation to person, time and place QAM and PRN. - Recommend extended visitation hours with familiar family/friends as feasible. - Staff to minimize disturbances at night. Turn off television when pt asleep or when not in use.    ## Safety and Observation Level:  - Based on my clinical evaluation, I estimate the patient to be  at low risk of self harm in the current setting. - At this time, we recommend  1:1 Observation. This decision is based on my review of the chart including patient's history and current presentation, interview of the patient, mental status examination, and consideration of suicide risk including evaluating suicidal ideation, plan, intent, suicidal or self-harm behaviors, risk factors, and protective  factors. This judgment is based on our ability to directly address suicide risk, implement suicide prevention strategies, and develop a safety plan while the patient is in the clinical setting. Please contact our team if there is a concern that risk level has changed.  CSSR Risk Category:C-SSRS RISK CATEGORY: High Risk  Suicide Risk Assessment: Patient has following modifiable risk factors for suicide: None; we will monitor patient overnight for possible discharge tomorrow on 12/15/2023. Patient has following non-modifiable or demographic risk factors for suicide: History of suicide attempt  patient has the following protective factors against suicide: Supportive family  Thank you for this consult request. Recommendations have been communicated to the primary team.  We will continue to follow at this time.   Linda Hayes C Jaran Sainz, NP-PMHNP-BC       History of Present Illness  Relevant Aspects of Hospital ED Course:  Admitted on 12/11/2023 brought in by EMS after texting her family that she was going to end her life and overdosing on her benzodiazepines. She carries the psychiatric diagnoses of anxiety disorder and depression, and has a past medical history of  dyslipidemia, hypothyroidism, recurrent UTIs, herpes zoster, meige syndrome, esophagitis and overactive bladder.    Patient Report:  Patient was seen this morning awake, alert and oriented x 4, she was pleasant and engaged in meaningful conversation.  She is still grieving the loss of her husband three months ago.  Patient admitted OD on Xanax  but now regrets it.  She is denying SI/HI/AVH but  she is afraid of going home yet because she believes the extra Xanax   she took is still in her system and is worried about withdrawal symptoms at home.  Her daughter Linda Hayes is not refusing her coming home but has concerns like patient about withdrawal from Xanax .` Patient's v/s are wnl, she is moving around in the room using his walker.  Patient is  eating and drinking.  We will continue to monitor for any withdrawal and treat accordingly.  Psych ROS:  Depression: endorses  Anxiety:  endorses  Mania (lifetime and current): denies Psychosis: (lifetime and current): denies  Collateral information:  None, patient daughter Linda Hayes was visiting with patient   Review of Systems  Skin:        Bruising on right inner thigh  Psychiatric/Behavioral:  Positive for depression.   All other systems reviewed and are negative.    Psychiatric and Social History  Psychiatric History:  Information collected from patient chart review and family member  Prev Dx/Sx: anxiety disorder and depression Current Psych Provider: none Home Meds (current): zoloft  and xanax  Previous Med Trials: unknown Therapy: none  Prior Psych Hospitalization: none  Prior Self Harm: none Prior Violence: none  Family Psych History: depression and anxiety in all relatives per daughter Family Hx suicide: no  Social History:  Developmental Hx: WNL  Educational Hx: unknown Occupational Hx: unemployed Armed forces operational officer Hx: none Living Situation: lives with family Spiritual Hx: none noted Access to weapons/lethal means: denies   Substance History Alcohol: denies Tobacco:denies  Illicit drugs: denies Prescription drug abuse: Possible overuse of benzodiazepines per daughter Rehab hx: none  Exam Findings  Physical Exam:  Vital Signs:  Temp:  [97.8 F (36.6 C)] 97.8 F (36.6 C) (05/05 0700) Pulse Rate:  [60-71] 60 (05/05 0700) Resp:  [16-17] 16 (05/05 0700) BP: (101-105)/(71-81) 105/71 (05/05 0700) SpO2:  [98 %] 98 % (05/05 0700) Blood pressure 105/71, pulse 60, temperature 97.8 F (36.6 C), temperature source Oral, resp. rate 16, height 5\' 4"  (1.626 m), weight 73.9 kg, SpO2 98%. Body mass index is 27.98 kg/m.  Physical Exam Vitals and nursing note reviewed.  Constitutional:      Appearance: She is obese.  HENT:     Mouth/Throat:     Comments: Tongue with  thick white coating Eyes:     Pupils: Pupils are equal, round, and reactive to light.  Pulmonary:     Effort: Pulmonary effort is normal.  Musculoskeletal:        General: Normal range of motion.  Skin:    General: Skin is dry.     Findings: Bruising present.  Neurological:     Mental Status: She is alert and oriented to person, place, and time. Mental status is at baseline.  Psychiatric:        Attention and Perception: Attention normal.        Mood and Affect: Mood is depressed.        Speech: Speech normal.        Behavior: Behavior is cooperative.        Judgment: Judgment is impulsive.     Comments: Very garbled speech     Mental Status Exam: General Appearance: Disheveled  Orientation:  Full (Time, Place, and Person)  Memory:   good  Concentration:  Good  Recall:  good  Attention  Good  Eye Contact:  Good  Speech:  Clear and coherent   Language:   good  Volume:  Normal  Mood: depressed   Affect:  Congruent, tearful and labile mood  Thought Process:  Goal Directed  Thought Content:  coherent,   Suicidal Thoughts:  Denies but patient is here following suicide attempt  Homicidal Thoughts:  No  Judgement: impulsive  Insight:  Fair  Psychomotor Activity:  Decreased  Akathisia:  No  Fund of Knowledge:  good      Assets:  Housing Leisure Time Social Support  Cognition: intact  ADL's:  intact  AIMS (if indicated):        Other History   These have been pulled in through the EMR, reviewed, and updated if appropriate.  Family History:  The patient's family history includes Colon polyps in her sister; Depression in an other family member; Diabetes in an other family member; Hypertension in an other family member; Other in her mother; Prostate cancer in her father.  Medical History: Past Medical History:  Diagnosis Date  . ANXIETY 03/17/2008  . ASTHMATIC BRONCHITIS, ACUTE 10/01/2008  . 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 Cancer (HCC)    thyroid  cancer   . Cataract     forming both eyes   . Colitis due to Clostridioides difficile 2007  . DEPRESSION 03/17/2008  . GAIT ATAXIA 06/11/2009  . GERD (gastroesophageal reflux disease)    diet related   . Hyperlipidemia   . HYPOTHYROIDISM 03/17/2008  . INSOMNIA, PERSISTENT 03/17/2008  . Meige syndrome (blepharospasm with oromandibular dystonia)   . OBESITY 03/17/2008  . Palpitations 10/01/2008  . SWEATING 03/17/2008    Surgical History: Past Surgical History:  Procedure Laterality Date  . COLONOSCOPY    . THYROIDECTOMY  1999  . TONSILLECTOMY  Medications:   Current Facility-Administered Medications:  .  hydrOXYzine  (ATARAX ) tablet 25 mg, 25 mg, Oral, TID PRN, Motley-Mangrum, Jadeka A, PMHNP, 25 mg at 12/15/23 2113 .  levothyroxine  (SYNTHROID ) tablet 125 mcg, 125 mcg, Oral, Q0600, Albertus Hughs, DO, 125 mcg at 12/17/23 1610 .  sertraline  (ZOLOFT ) tablet 100 mg, 100 mg, Oral, Daily, Weber, Kyra A, NP, 100 mg at 12/17/23 0924 .  traZODone  (DESYREL ) tablet 50 mg, 50 mg, Oral, QHS, Motley-Mangrum, Jadeka A, PMHNP, 50 mg at 12/16/23 2116  Current Outpatient Medications:  .  ADVIL  200 MG CAPS, Take 200-600 mg by mouth every 6 (six) hours as needed (for pain or headaches)., Disp: , Rfl:  .  alprazolam  (XANAX ) 2 MG tablet, Take 1 tablet (2 mg total) by mouth 3 (three) times daily as needed for anxiety. (Patient taking differently: Take 2 mg by mouth in the morning, at noon, and at bedtime.), Disp: 270 tablet, Rfl: 1 .  BENADRYL ALLERGY CHILDRENS 12.5-5 MG/5ML SOLN, Take 12.5 mg by mouth at bedtime as needed (for sleep)., Disp: , Rfl:  .  Carboxymeth-Glycerin-Polysorb (REFRESH OPTIVE ADVANCED PF OP), Place 1 drop into both eyes 3 (three) times daily as needed (FOR DRYNESS)., Disp: , Rfl:  .  OVER THE COUNTER MEDICATION, Take 1-2 tablets by mouth See admin instructions. Advil  Dual Action tablets/caplets: Take 1-2 caplets by mouth every 8 hours as needed for headaches or pain, Disp: , Rfl:  .  OVER THE COUNTER  MEDICATION, Take 1 capsule by mouth See admin instructions. AZO Dual Protection Urinary + Vaginal Support Capsules: Take 1 capsule by mouth once a day (prebiotic + probiotic), Disp: , Rfl:  .  sertraline  (ZOLOFT ) 100 MG tablet, TAKE 2 TABLETS BY MOUTH EVERY DAY, Disp: 180 tablet, Rfl: 1 .  SYNTHROID  125 MCG tablet, TAKE 1 TABLET BY MOUTH EVERY DAY, Disp: 90 tablet, Rfl: 3 .  Cholecalciferol (VITAMIN D3) 50 MCG (2000 UT) capsule, Take 1 capsule (2,000 Units total) by mouth daily., Disp: 100 capsule, Rfl: 3 .  EPINEPHrine  (EPIPEN  2-PAK) 0.3 mg/0.3 mL IJ SOAJ injection, Inject 0.3 mg into the muscle as needed for anaphylaxis., Disp: 2 each, Rfl: 0 .  Multiple Vitamins-Minerals (CENTRUM SILVER  ADULT 50+) TABS, 1 po qd, Disp: 100 tablet, Rfl: 3  Allergies: Allergies  Allergen Reactions  . Ciprofloxacin Other (See Comments)    Caused colitis  . Macrobid  [Nitrofurantoin ] Other (See Comments)    Reaction not cited  . Ambien  [Zolpidem ] Other (See Comments)    Memory, balance issues  . Cefdinir  Other (See Comments)    Made the patient feel not well  . Erythromycin Other (See Comments)    GI issues  . Gabapentin  Cough  . Hydrocodone  Nausea And Vomiting  . Tramadol  Nausea And Vomiting  . Amoxicillin Rash  . Penicillins Rash and Other (See Comments)    Pt can take cephalosporins ok.  . Ritalin  [Methylphenidate] Palpitations    Ivadell Gaul C Elver Stadler, NP-PMHNP-BC

## 2023-12-17 NOTE — Progress Notes (Signed)
 Physical Therapy Treatment Patient Details Name: Linda Hayes MRN: [id number] DOB: October 02, 1950 Today's Date: 12/17/2023   History of Present Illness 73 yo female presents to ED4/29/25  via EMS with intentional medication overdose. NWG:NFAO ataxia, blepharospasm w/ mandibular dystonia, GERD    PT Comments  Pt ambulated 160' with RW with intermittent min assist for balance due to unsteadiness, especially with turns and when she releases grip from the walker (pt "talks" with her hands). Pt noted to be incontinent of urine, her pants and bed were wet with urine, NT notified. Pt also requesting a shower, NT aware.    If plan is discharge home, recommend the following: A little help with walking and/or transfers;A little help with bathing/dressing/bathroom;Assistance with cooking/housework;Assist for transportation;Help with stairs or ramp for entrance   Can travel by private vehicle        Equipment Recommendations  None recommended by PT    Recommendations for Other Services       Precautions / Restrictions Precautions Precautions: Fall Precaution/Restrictions Comments: ataxia Restrictions Weight Bearing Restrictions Per Provider Order: No     Mobility  Bed Mobility Overal bed mobility: Needs Assistance Bed Mobility: Supine to Sit     Supine to sit: Supervision, HOB elevated, Used rails          Transfers Overall transfer level: Needs assistance Equipment used: Rolling walker (2 wheels) Transfers: Sit to/from Stand Sit to Stand: Min assist           General transfer comment: steadying assistance to stand    Ambulation/Gait Ambulation/Gait assistance: Min assist Gait Distance (Feet): 160 Feet Assistive device: Rolling walker (2 wheels) Gait Pattern/deviations: Ataxic, Trunk flexed, Shuffle, Step-through pattern Gait velocity: decr     General Gait Details: min A for balance, unsteady when she releases RW   Stairs             Wheelchair Mobility      Tilt Bed    Modified Rankin (Stroke Patients Only)       Balance Overall balance assessment: History of Falls, Needs assistance Sitting-balance support: Feet supported, No upper extremity supported Sitting balance-Leahy Scale: Fair     Standing balance support: During functional activity, Bilateral upper extremity supported, Reliant on assistive device for balance Standing balance-Leahy Scale: Poor                              Communication Communication Factors Affecting Communication: Reduced clarity of speech  Cognition Arousal: Alert Behavior During Therapy: WFL for tasks assessed/performed   PT - Cognitive impairments: No family/caregiver present to determine baseline                       PT - Cognition Comments: oriented to place and date, able to state her neurological diagnosis Following commands: Intact      Cueing Cueing Techniques: Verbal cues  Exercises      General Comments        Pertinent Vitals/Pain Pain Assessment Pain Assessment: No/denies pain    Home Living                          Prior Function            PT Goals (current goals can now be found in the care plan section) Acute Rehab PT Goals Patient Stated Goal: agreed to ambulate PT Goal Formulation: With patient Time For Goal  Achievement: 12/28/23 Potential to Achieve Goals: Fair Progress towards PT goals: Progressing toward goals    Frequency    Min 2X/week      PT Plan      Co-evaluation              AM-PAC PT "6 Clicks" Mobility   Outcome Measure  Help needed turning from your back to your side while in a flat bed without using bedrails?: None Help needed moving from lying on your back to sitting on the side of a flat bed without using bedrails?: None Help needed moving to and from a bed to a chair (including a wheelchair)?: A Little Help needed standing up from a chair using your arms (e.g., wheelchair or bedside chair)?: A  Little Help needed to walk in hospital room?: A Little Help needed climbing 3-5 steps with a railing? : A Lot 6 Click Score: 19    End of Session Equipment Utilized During Treatment: Gait belt Activity Tolerance: Patient tolerated treatment well Patient left: with nursing/sitter in room;in chair;with call bell/phone within reach Nurse Communication: Mobility status PT Visit Diagnosis: Unsteadiness on feet (R26.81);History of falling (Z91.81);Other symptoms and signs involving the nervous system (R29.898)     Time: 1191-4782 PT Time Calculation (min) (ACUTE ONLY): 14 min  Charges:    $Gait Training: 8-22 mins PT General Charges $$ ACUTE PT VISIT: 1 Visit                     Daymon Evans PT 12/17/2023  Acute Rehabilitation Services  Office [phone]

## 2023-12-17 NOTE — Telephone Encounter (Signed)
 LVM for pts daughter Mylinda Asa to give the clinic a call back for her mom's gene test and the findings that are agreeable for her mom to take per this test.

## 2023-12-18 MED ORDER — HYDROXYZINE HCL 10 MG PO TABS: 10.0000 mg | ORAL_TABLET | Freq: Three times a day (TID) | ORAL | 0 refills | Status: DC | PRN

## 2023-12-18 MED ORDER — LEVOTHYROXINE SODIUM 125 MCG PO TABS
125.0000 ug | ORAL_TABLET | Freq: Every day | ORAL | 0 refills | Status: DC
Start: 2023-12-19 — End: 2024-06-24

## 2023-12-18 MED ORDER — TRAZODONE HCL 50 MG PO TABS: 50.0000 mg | ORAL_TABLET | Freq: Every day | ORAL | 0 refills | Status: DC

## 2023-12-18 MED ORDER — FLUOXETINE HCL 10 MG PO CAPS: 10.0000 mg | ORAL_CAPSULE | Freq: Every day | ORAL | 0 refills | Status: DC

## 2023-12-18 MED ORDER — SERTRALINE HCL 25 MG PO TABS
ORAL_TABLET | ORAL | 7 refills | Status: DC
Start: 2023-12-19 — End: 2023-12-23

## 2023-12-18 NOTE — Progress Notes (Signed)
 Mobility Specialist - Progress Note   12/18/23 1436  Mobility  Activity Ambulated with assistance in hallway  Level of Assistance Contact guard assist, steadying assist  Assistive Device Front wheel walker  Distance Ambulated (ft) 150 ft  Activity Response Tolerated well  Mobility Referral Yes  Mobility visit 1 Mobility  Mobility Specialist Start Time (ACUTE ONLY) 1417  Mobility Specialist Stop Time (ACUTE ONLY) 1434  Mobility Specialist Time Calculation (min) (ACUTE ONLY) 17 min   Pt received in bed and agreeable to mobility. Prior to ambulating, pt requested assistance to the bathroom. Pt had x2 LOB that were corrected with gait belt. No complaints during session. Pt to bed after session with all needs met. Sitter in room.   [facility]

## 2023-12-18 NOTE — ED Provider Notes (Signed)
 Patient has been cleared by psychiatry and has been cleared for discharge. Patient underwent treatment for UTI while in ER and has completed her course of therapy. Will discharge. Resources in AVS   Mordecai Applebaum, MD 12/18/23 [phone]

## 2023-12-18 NOTE — Progress Notes (Signed)
    Durable Medical Equipment  (From admission, onward)           Start     Ordered   12/18/23 1542  For home use only DME Walker rolling  Once       Question Answer Comment  Walker: With 5 Inch Wheels   Patient needs a walker to treat with the following condition Ataxia      12/18/23 1541

## 2023-12-18 NOTE — ED Notes (Signed)
 [facility] consulted with Jada Yarbrough from [facility] regarding a SNF being a viable option for pt. 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 [facility] and provider will continue to work with pts family to develop a safe plan for pt discharge home. Pts family was previously given resources regarding PACE program and a senior resource guide to assist with services once pt returns home.  Patt Boozer, [facility]  12/18/23

## 2023-12-18 NOTE — ED Notes (Signed)
 [facility] spoke with pt about her daughters coming to pick her up today for her discharge. Pt is aware that she will have referral information for follow up treatment and therapy as well as PACE program information. 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 Pt said that she understood that it is important for her not to "be drugged out" by taking medications. Pt was walking around the unit with her walker remarking about the importance of getting her strength back.  Patt Boozer, [facility]  12/18/23

## 2023-12-18 NOTE — ED Notes (Signed)
 [facility] called pts daughter Ginnie Laine to inform her that pt is being psychiatrically cleared. [facility] left a message to return the call.   [facility] called pts daughter Minta Ambrosia to inform her that pt is being psychiatrically cleared and will be ready for discharge. Pts daughter requested resources for outpatient follow up treatment and therapy as well as another copy of the information for the PACE program and the senior resource guide. [facility] informed pts daughter that the provider would be putting outpatient referral information in pts discharge summary and a TOC consult would be placed to assist with any additional resources needed such as home care. Pts daughter said that she would pick pt up around 5:30pm today.   Patt Boozer, [facility]  12/18/23

## 2023-12-18 NOTE — Discharge Instructions (Addendum)
 You were treated for a UTI while in the emergency department. You completed your course of therapy. Please follow up with your primary doctor.   Private Pay Resources  [facility] Address: [address] [place], [place] [address] Phone: [phone]  [facility] Address: [address], [place], [place] [address] Phone: [phone]  [facility] Address: [address] [place], [place] [address] Phone: [phone]  [facility] Address: [address] [place], [place] [address] Phone: [phone]  [facility] Address: [address] [place], [place], [place] [address] Phone: [phone]  [facility] Phone: [phone]  [facility] Address:  [address], [place], [place] [address] Phone:  [phone]  [facility] Address:  [address] Phone:  [phone]  [email or web address]  [facility] [employer] Phone: [phone]

## 2023-12-18 NOTE — Care Management (Addendum)
 This RNCM received TOC consult for DME: rolling walker. Per chart review patient is currently under psych due to OD using Xanax . This RNCM attempted to call patient's daughter, per chart review patient's daughter will pick her up today at 5:30pm and needs rolling walker prior to discharge. This RNCM spoke with patient at bedside to offer choice for HH/ DME, patient will accept anyone that accepts her insurance. Patient reports she lives at home with her daughter and has family support. Patient is concerned about what mediations she will be prescribed at discharge, this RNCM encouraged her to ask [facility] and RN taking care of her.  Notified EDP of need for DME: rolling walker, awaiting orders. Orders on file. Notified Jermaine with [employer] of need for DME: rolling walker by 5:30pm.   Per chart review psych team provided PACE information. Patient may not qualify for [facility] services however this RNCM notified Cheryl with [facility] for HHRN, HHPT.   Awaiting HH outcome which may not be today. Bartholomew Light w/[facility] is following   Awaiting response from Jermaine w/[employer] re: DME rolling walker being delivered to bedside prior to discharge.   - 4:21pm This RNCM notified Jermaine with [employer] re: DME rolling walker to cancel request, due to no response.  Notified Mitch with [employer] who will expedite DME :RW to deliver to bedside.   TOC following.

## 2023-12-18 NOTE — ED Notes (Signed)
 Patient has been cooperative and pleasant with nursing staff during this shift, however patient c/o becoming very anxious when patient in a nearby room is hollering and singing loudly. Patient given PRN Hydroxyzine  for anxiety. Patient is also experiencing dry eyes due to uncontrolled blinking due to her Meige's Syndrome. Provider on call was made aware.

## 2023-12-18 NOTE — ED Provider Notes (Addendum)
 Emergency Medicine Observation Re-evaluation Note  Linda Hayes is a 73 y.o. female, seen on rounds today.  Pt initially presented to the ED for complaints of Suicide Attempt Currently, the patient is resting comfortably.  Physical Exam  BP 113/66 (BP Location: Right Arm)   Pulse 68   Temp 98.7 F (37.1 C) (Oral)   Resp 16   Ht 5\' 4"  (1.626 m)   Wt 73.9 kg   SpO2 96%   BMI 27.98 kg/m  Physical Exam General: No acute distress Cardiac: Regular rate Lungs: No respiratory distress Psych: Currently calm  ED Course / MDM  EKG:EKG Interpretation Date/Time:  Tuesday December 11 2023 21:57:19 EDT Ventricular Rate:  62 PR Interval:  218 QRS Duration:  95 QT Interval:  408 QTC Calculation: 415 R Axis:   -23  Text Interpretation: Sinus rhythm Multiple ventricular premature complexes Borderline prolonged PR interval Probable left atrial enlargement Borderline left axis deviation Low voltage, precordial leads Abnormal R-wave progression, early transition Confirmed by Iva Mariner [phone]) on 12/11/2023 10:27:33 PM  I have reviewed the labs performed to date as well as medications administered while in observation.  Recent changes in the last 24 hours include -no new changes.  Patient has been difficult to place.  Recent COVID test was negative.  Plan  Current plan is for holding patient for psychiatric stabilization.   As an aside, patient has uti. She is on keflex . Likely contributing to weakness, ataxia.    Deatra Face, MD 12/18/23 [phone]

## 2023-12-18 NOTE — Care Management (Signed)
 Received call from ED SW regarding a call from patient's daughter Linda Hayes questioning having someone daily to assist with medications.   This RNCM spoke with patient's daughter Linda Hayes [phone] who is inquiring about having a RN come in to provide medication management for her mother daily. This RNCM advised there are resources for those services called private duty nursing however the insurance does not cover those expenses resulting in out of pocket expense.  [facility] ED SW has attached private duty nursing list to AVS. This RNCM recommended to patient's daughter to get a pill box and not leave the medication bottle out, as the [facility] will only come 1-2 times per week. This RNCM also explained the patient's insurance will need to approve the [facility] services and the [facility] agency will give a call 24-48 hours after leaving the hospital. Linda Hayes questioning if patient's UTI has cleared up this RNCM referred to bedside RN however it doesn't look like she's currently being treated. Patient's daughter reports she will be calling this RNCM, and this RNCM advised she will need to follow up with PCP and [facility] agency once she's discharged from the hospital as TOC does not follow post discharge. Linda Hayes verbalized understanding.  No additional TOC needs.

## 2023-12-18 NOTE — Consult Note (Signed)
 Linda Linda Hayes Psychiatric Consult Follow-up  Patient Name: .Linda Linda Hayes Linda Hayes  MRN: [id number]  DOB: Oct 16, 1950  Consult Order details:  Orders (From admission, onward)     Start     Ordered   12/11/23 2349  CONSULT TO CALL ACT TEAM       Ordering Provider: Iva Mariner, MD  Provider:  (Not yet assigned)  Question:  Reason for Consult?  Answer:  suicide attempt   12/11/23 2348             Mode of Visit: Face to face    Psychiatry Consult Evaluation  Service Date: Dec 18, 2023 LOS:  LOS: 0 days  Chief Complaint Patient states, "I've never done anything like this before."  Primary Psychiatric Diagnoses  Suicide attempt by benzodiazepine overdose  Assessment  Linda Linda Hayes is a 73 y.o. female admitted: Presented to the EDfor 12/11/2023  9:26 PM brought in by EMS after texting her family that she was going to end her life and overdosing on her benzodiazepines. She carries the psychiatric diagnoses of anxiety disorder and depression, and has a past medical history of  dyslipidemia, hypothyroidism, recurrent UTIs, herpes zoster, meige syndrome, esophagitis and overactive bladder.  Efforts to place patient in a Geropsych unit failed due to hospitals not offering beds.  However while she was here she was Psychiatrically cared for with Medication management.  Patient is Psychiatrically cleared and will be picked up by her daughter.   Diagnoses:  Active Hospital problems: Principal Problem:   Suicide attempt by benzodiazepine overdose (HCC) Active Problems:   Anxiety disorder    Plan   ## Psychiatric Medication Recommendations:  No medication changes today; Continue Zoloft  100mg  po daily for depression  Continue  trazodone  50 mg as needed for sleep Continue  Atarax  25 mg p.o. 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 -- most recent EKG on 12/12/2023 had QtC of 415 -- Pertinent labwork reviewed earlier this admission includes: CBC, CMP, acetaminophen , salicylate, TSH, UA, alcohol and UDS   ## Disposition:-  Patient is Psychiatrically cleared.  She has completed withdrawing from Xanax  OD. She is safe to go home and engage in outpatient Psychiatry care and therapy as she continues to grieve the loss of her husband. ## Behavioral / Environmental: -Delirium Precautions: Delirium Interventions for Nursing and Staff: - RN to open blinds every AM. - To Bedside: Glasses, hearing aide, and pt's own shoes. Make available to patients. when possible and encourage use. - Encourage po fluids when appropriate, keep fluids within reach. - OOB to chair with meals. - Passive ROM exercises to all extremities with AM & PM care. - RN to assess orientation to person, time and place QAM and PRN. - Recommend extended visitation hours with familiar family/friends as feasible. - Staff to minimize disturbances at night. Turn off television when pt asleep or when not in use.    ## Safety and Observation Level:  - Based on my clinical evaluation, I estimate the patient to be at low risk of self harm in the current setting. - At this time, we recommend  routine. This decision is based on my review of the chart including patient's history and current presentation, interview of the patient, mental status examination, and consideration of suicide risk including evaluating suicidal ideation, plan, intent,  suicidal or self-harm behaviors, risk factors, and protective factors. This judgment is based on our ability to directly address suicide risk, implement suicide prevention strategies, and develop a safety plan while the patient is in the clinical setting. Please contact our team if there is a concern that risk level has changed.  CSSR Risk Category:C-SSRS RISK CATEGORY: High Risk  Suicide Risk Assessment: Patient has  following modifiable risk factors for suicide: None Patient has following non-modifiable or demographic risk factors for suicide: History of suicide attempt  patient has the following protective factors against suicide: Supportive family  Thank you for this consult request. Patient is Psychiatrically cleared to go home.  We will sign off at this time.   Muhamed Luecke C Landi Biscardi, NP-PMHNP-BC       History of Present Illness  Relevant Aspects of Hospital ED Course:  Admitted on 12/11/2023 brought in by EMS after texting her family that she was going to end her life and overdosing on her benzodiazepines. She carries the psychiatric diagnoses of anxiety disorder and depression, and has a past medical history of  dyslipidemia, hypothyroidism, recurrent UTIs, herpes zoster, meige syndrome, esophagitis and overactive bladder.  Patient :  Patient was seen this morning awake, alert and oriented x 4, she was pleasant and engaged in meaningful conversation.  She was evaluated by PT and she walked on her own on a short distance.  Patient is eating and sleeping well and her V/S remains stable.  Patient is resting in her room and states she is feeling better and plans not to use Xanax  anymore.  Patient states she is going to need a new PCP and will instruct not to give her Xanax  again.  Patient is aware she will be leaving tomorrow she is ok with her going home but worried if her daughters will be happy she is coming home.  Patient has been worried about getting a new PCP to manage her medical care.   We discussed engaging in Psychotherapy and pace program.  Patient is in agreement.  Today she denies SI/HI/AVH and no mention of paranoia.  Patient is not having any hallucination and not responding to internal stimuli.  Patient is Psychiatrically cleared and daughter is coming to take her home. Psych ROS:  Depression: endorses  Anxiety:  endorses  Mania (lifetime and current): denies Psychosis: (lifetime and current):  denies  Collateral information:   Patient daughter [name] was called and informed that patient is Psychiatrically cleared.  She asked questions about home care and plan.  She was advised that her mother needs Psychotherapy for grieving process and that she will benefit from [facility] Program where she can go out to the community and meet other clients for activities.  [name] is in agreement and states they need resources.  Resources were kept for her as she plans to come get her mother late this evening.   Review of Systems  Skin:        Bruising on right inner thigh  Psychiatric/Behavioral:  Positive for depression.   All other systems reviewed and are negative.    Psychiatric and Social History  Psychiatric History:  Information collected from patient chart review and family member  Prev Dx/Sx: anxiety disorder and depression Current Psych Provider: none Home Meds (current): zoloft  and xanax  Previous Med Trials: unknown Therapy: none  Prior Psych Hospitalization: none  Prior Self Harm: none Prior Violence: none  Family Psych History: depression and anxiety in all relatives per daughter Family Hx suicide: no  Social  History:  Developmental Hx: WNL  Educational Hx: unknown Occupational Hx: unemployed Armed forces operational officer Hx: none Living Situation: lives with family Spiritual Hx: none noted Access to weapons/lethal means: denies   Substance History Alcohol: denies Tobacco:denies  Illicit drugs: denies Prescription drug abuse: Possible overuse of benzodiazepines per daughter Rehab hx: none  Exam Findings  Physical Exam:  Vital Signs:  Temp:  [98.7 F (37.1 C)-99.3 F (37.4 C)] 98.7 F (37.1 C) (05/06 0657) Pulse Rate:  [68-89] 68 (05/06 0657) Resp:  [16] 16 (05/06 0657) BP: (113-125)/(66-81) 113/66 (05/06 0657) SpO2:  [96 %-99 %] 96 % (05/06 0657) Blood pressure 113/66, pulse 68, temperature 98.7 F (37.1 C), temperature source Oral, resp. rate 16, height 5\' 4"  (1.626 m),  weight 73.9 kg, SpO2 96%. Body mass index is 27.98 kg/m.  Physical Exam Vitals and nursing note reviewed.  HENT:     Nose: Nose normal.  Eyes:     Pupils: Pupils are equal, round, and reactive to light.  Pulmonary:     Effort: Pulmonary effort is normal.  Musculoskeletal:        General: Normal range of motion.  Skin:    General: Skin is dry.  Neurological:     Mental Status: She is alert and oriented to person, place, and time. Mental status is at baseline.  Psychiatric:        Attention and Perception: Attention normal.        Speech: Speech normal.        Behavior: Behavior is cooperative.        Thought Content: Thought content normal.        Judgment: Judgment normal.     Mental Status Exam: General Appearance: Disheveled  Orientation:  Full (Time, Place, and Person)  Memory:   good  Concentration:  Good  Recall:  good  Attention  Good  Eye Contact:  Good  Speech:  Clear and coherent   Language:   good  Volume:  Normal  Mood: depressed   Affect:  Congruent  Thought Process:  Goal Directed  Thought Content:  coherent,   Suicidal Thoughts:  Denies  Homicidal Thoughts:  No  Judgement: impulsive  Insight:  Fair  Psychomotor Activity:  Decreased  Akathisia:  No  Fund of Knowledge:  good      Assets:  Housing Leisure Time Social Support  Cognition: intact  ADL's:  intact  AIMS (if indicated):        Other History   These have been pulled in through the EMR, reviewed, and updated if appropriate.  Family History:  The patient's family history includes Colon polyps in her sister; Depression in an other family member; Diabetes in an other family member; Hypertension in an other family member; Other in her mother; Prostate cancer in her father.  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   Cancer (HCC)    thyroid  cancer    Cataract    forming both eyes     Colitis due to Clostridioides difficile 2007   DEPRESSION 03/17/2008   GAIT ATAXIA 06/11/2009   GERD (gastroesophageal reflux disease)    diet related    Hyperlipidemia    HYPOTHYROIDISM 03/17/2008   INSOMNIA, PERSISTENT 03/17/2008   Meige syndrome (blepharospasm with oromandibular dystonia)    OBESITY 03/17/2008   Palpitations 10/01/2008   SWEATING 03/17/2008    Surgical History: Past Surgical History:  Procedure Laterality Date  COLONOSCOPY     THYROIDECTOMY  1999   TONSILLECTOMY       Medications:   Current Facility-Administered Medications:    acetaminophen  (TYLENOL ) 160 MG/5ML solution 650 mg, 650 mg, Oral, Once, Nanavati, Ankit, MD   FLUoxetine (PROZAC) capsule 10 mg, 10 mg, Oral, Daily, Braylinn Gulden C, NP, 10 mg at 12/18/23 0943   hydrOXYzine  (ATARAX ) tablet 25 mg, 25 mg, Oral, TID PRN, Motley-Mangrum, Jadeka A, PMHNP, 25 mg at 12/18/23 1154   levothyroxine  (SYNTHROID ) tablet 125 mcg, 125 mcg, Oral, Q0600, Albertus Hughs, DO, 125 mcg at 12/18/23 0654   sertraline  (ZOLOFT ) tablet 50 mg, 50 mg, Oral, Daily, Delsie Amador C, NP, 50 mg at 12/18/23 0943   traZODone  (DESYREL ) tablet 50 mg, 50 mg, Oral, QHS, Motley-Mangrum, Jadeka A, PMHNP, 50 mg at 12/17/23 2116  Current Outpatient Medications:    ADVIL  200 MG CAPS, Take 200-600 mg by mouth every 6 (six) hours as needed (for pain or headaches)., Disp: , Rfl:    alprazolam  (XANAX ) 2 MG tablet, Take 1 tablet (2 mg total) by mouth 3 (three) times daily as needed for anxiety. (Patient taking differently: Take 2 mg by mouth in the morning, at noon, and at bedtime.), Disp: 270 tablet, Rfl: 1   BENADRYL ALLERGY CHILDRENS 12.5-5 MG/5ML SOLN, Take 12.5 mg by mouth at bedtime as needed (for sleep)., Disp: , Rfl:    Carboxymeth-Glycerin-Polysorb (REFRESH OPTIVE ADVANCED PF OP), Place 1 drop into both eyes 3 (three) times daily as needed (FOR DRYNESS)., Disp: , Rfl:    OVER THE COUNTER MEDICATION, Take 1-2 tablets by mouth See admin  instructions. Advil  Dual Action tablets/caplets: Take 1-2 caplets by mouth every 8 hours as needed for headaches or pain, Disp: , Rfl:    OVER THE COUNTER MEDICATION, Take 1 capsule by mouth See admin instructions. AZO Dual Protection Urinary + Vaginal Support Capsules: Take 1 capsule by mouth once a day (prebiotic + probiotic), Disp: , Rfl:    sertraline  (ZOLOFT ) 100 MG tablet, TAKE 2 TABLETS BY MOUTH EVERY DAY, Disp: 180 tablet, Rfl: 1   SYNTHROID  125 MCG tablet, TAKE 1 TABLET BY MOUTH EVERY DAY, Disp: 90 tablet, Rfl: 3   Cholecalciferol (VITAMIN D3) 50 MCG (2000 UT) capsule, Take 1 capsule (2,000 Units total) by mouth daily., Disp: 100 capsule, Rfl: 3   EPINEPHrine  (EPIPEN  2-PAK) 0.3 mg/0.3 mL IJ SOAJ injection, Inject 0.3 mg into the muscle as needed for anaphylaxis., Disp: 2 each, Rfl: 0   Multiple Vitamins-Minerals (CENTRUM SILVER  ADULT 50+) TABS, 1 po qd, Disp: 100 tablet, Rfl: 3  Allergies: Allergies  Allergen Reactions   Ciprofloxacin Other (See Comments)    Caused colitis   Macrobid  [Nitrofurantoin ] Other (See Comments)    Reaction not cited   Ambien  [Zolpidem ] Other (See Comments)    Memory, balance issues   Cefdinir  Other (See Comments)    Made the patient feel not well   Erythromycin Other (See Comments)    GI issues   Gabapentin  Cough   Hydrocodone  Nausea And Vomiting   Tramadol  Nausea And Vomiting   Amoxicillin Rash   Penicillins Rash and Other (See Comments)    Pt can take cephalosporins ok.   Ritalin  [Methylphenidate] Palpitations    Makaelah Cranfield C Lameeka Schleifer, NP-PMHNP-BC

## 2023-12-20 ENCOUNTER — Telehealth: Payer: Self-pay | Admitting: Internal Medicine

## 2023-12-20 DIAGNOSIS — T1491XD Suicide attempt, subsequent encounter: Secondary | ICD-10-CM | POA: Diagnosis not present

## 2023-12-20 DIAGNOSIS — E039 Hypothyroidism, unspecified: Secondary | ICD-10-CM | POA: Diagnosis not present

## 2023-12-20 DIAGNOSIS — E785 Hyperlipidemia, unspecified: Secondary | ICD-10-CM | POA: Diagnosis not present

## 2023-12-20 DIAGNOSIS — D649 Anemia, unspecified: Secondary | ICD-10-CM | POA: Diagnosis not present

## 2023-12-20 DIAGNOSIS — M6281 Muscle weakness (generalized): Secondary | ICD-10-CM | POA: Diagnosis not present

## 2023-12-20 DIAGNOSIS — Z79899 Other long term (current) drug therapy: Secondary | ICD-10-CM | POA: Insufficient documentation

## 2023-12-20 DIAGNOSIS — F4320 Adjustment disorder, unspecified: Secondary | ICD-10-CM | POA: Diagnosis not present

## 2023-12-20 DIAGNOSIS — F419 Anxiety disorder, unspecified: Secondary | ICD-10-CM | POA: Diagnosis not present

## 2023-12-20 DIAGNOSIS — G245 Blepharospasm: Secondary | ICD-10-CM | POA: Diagnosis not present

## 2023-12-20 DIAGNOSIS — F0390 Unspecified dementia without behavioral disturbance: Secondary | ICD-10-CM | POA: Diagnosis not present

## 2023-12-20 DIAGNOSIS — K219 Gastro-esophageal reflux disease without esophagitis: Secondary | ICD-10-CM | POA: Insufficient documentation

## 2023-12-20 DIAGNOSIS — N3001 Acute cystitis with hematuria: Secondary | ICD-10-CM | POA: Diagnosis not present

## 2023-12-20 DIAGNOSIS — F331 Major depressive disorder, recurrent, moderate: Secondary | ICD-10-CM | POA: Insufficient documentation

## 2023-12-20 DIAGNOSIS — T424X2D Poisoning by benzodiazepines, intentional self-harm, subsequent encounter: Secondary | ICD-10-CM | POA: Diagnosis not present

## 2023-12-20 DIAGNOSIS — F3289 Other specified depressive episodes: Secondary | ICD-10-CM | POA: Diagnosis not present

## 2023-12-20 NOTE — Telephone Encounter (Signed)
 Copied from CRM [id number]. Topic: Clinical - Home Health Verbal Orders >> Dec 20, 2023 12:30 PM Varney Gentleman wrote: Caller/Agency: [facility] Callback Number: [phone] Secure Line Service Requested: Skilled Nursing, Fall reduction, disease process and fall education, nutrition, MSW Social Work, Occupation Therapy ordered Frequency: 1 week 9, EVAl for MSW and Occupational Therapy Any new concerns about the patient? No

## 2023-12-21 NOTE — Telephone Encounter (Signed)
Okay with me.  Thank you °

## 2023-12-22 ENCOUNTER — Telehealth: Admitting: Nurse Practitioner

## 2023-12-22 DIAGNOSIS — B3781 Candidal esophagitis: Secondary | ICD-10-CM | POA: Diagnosis not present

## 2023-12-22 DIAGNOSIS — B370 Candidal stomatitis: Secondary | ICD-10-CM

## 2023-12-22 MED ORDER — FLUCONAZOLE 150 MG PO TABS: ORAL_TABLET | ORAL | 0 refills | Status: DC

## 2023-12-22 NOTE — Progress Notes (Signed)
 Virtual Visit Consent   THERES MEININGER, you are scheduled for a virtual visit with a [place] provider today. Just as with appointments in the office, your consent must be obtained to participate. Your consent will be active for this visit and any virtual visit you may have with one of our providers in the next 365 days. If you have a MyChart account, a copy of this consent can be sent to you electronically.  As this is a virtual visit, video technology does not allow for your provider to perform a traditional examination. This may limit your provider's ability to fully assess your condition. If your provider identifies any concerns that need to be evaluated in person or the need to arrange testing (such as labs, EKG, etc.), we will make arrangements to do so. Although advances in technology are sophisticated, we cannot ensure that it will always work on either your end or our end. If the connection with a video visit is poor, the visit may have to be switched to a telephone visit. With either a video or telephone visit, we are not always able to ensure that we have a secure connection.  By engaging in this virtual visit, you consent to the provision of healthcare and authorize for your insurance to be billed (if applicable) for the services provided during this visit. Depending on your insurance coverage, you may receive a charge related to this service.  I need to obtain your verbal consent now. Are you willing to proceed with your visit today? [name] has provided verbal consent on 12/22/2023 for a virtual visit (video or telephone). [name], NP  Date: 12/22/2023 9:14 AM   Virtual Visit via Video Note   I, [name], connected with  Linda Hayes  ([id number], 1951/05/18) on 12/22/23 at  9:00 AM EDT by a video-enabled telemedicine application and verified that I am speaking with the correct person using two identifiers.  Location: Patient: Virtual Visit Location Patient:  Home Provider: Virtual Visit Location Provider: Home Office   I discussed the limitations of evaluation and management by telemedicine and the availability of in person appointments. The patient expressed understanding and agreed to proceed.    History of Present Illness: Linda Hayes is a 74 y.o. who identifies as a female who was assigned female at birth, and is being seen today for thrush.  Thrush: Patient here for evaluation of evaluation of white spots in mouth. Onset of symptoms was 2 days ago, gradually worsening since that time. Daughters state she was treated for thrush and UTI recently in the hospital as well. She notes pain with eating and drinking. Associated symptoms include:burning and numbness. Denies fever, chills or erythematous tonsils.  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   LLQ abdominal pain 01/15/2020   UTI (urinary tract infection) 07/16/2019   Thrush 03/07/2017   Esophagitis 03/07/2017   Chest wall symptom 06/28/2015  Earache on right 02/09/2015   Well adult exam 03/31/2014   Dyslipidemia 03/31/2014   Cough 12/17/2013   Post herpetic neuralgia 12/17/2013   Gastroenteritis, acute 03/13/2013   Anemia 03/13/2013   Acute sinusitis 11/12/2012   Rash of back 05/27/2012   Herpes zoster 05/27/2012   Left low back pain 05/27/2012   Blepharospasm 06/11/2009   GAIT ATAXIA 06/11/2009    Palpitations 10/01/2008   Hypothyroidism 03/17/2008   OBESITY 03/17/2008   Anxiety disorder 03/17/2008   Insomnia disorder 03/17/2008   SWEATING 03/17/2008    Allergies:  Allergies  Allergen Reactions   Ciprofloxacin Other (See Comments)    Caused colitis   Macrobid  [Nitrofurantoin ] Other (See Comments)    Reaction not cited   Ambien  [Zolpidem ] Other (See Comments)    Memory, balance issues   Cefdinir  Other (See Comments)    Made the patient feel not well   Erythromycin Other (See Comments)    GI issues   Gabapentin  Cough   Hydrocodone  Nausea And Vomiting   Tramadol  Nausea And Vomiting   Amoxicillin Rash   Penicillins Rash and Other (See Comments)    Pt can take cephalosporins ok.   Ritalin  [Methylphenidate] Palpitations   Medications:  Current Outpatient Medications:    fluconazole  (DIFLUCAN ) 150 MG tablet, Take 2 tablets on day 1 then 1 tablet on days 2-10, Disp: 11 tablet, Rfl: 0   ADVIL  200 MG CAPS, Take 200-600 mg by mouth every 6 (six) hours as needed (for pain or headaches)., Disp: , Rfl:    Carboxymeth-Glycerin-Polysorb (REFRESH OPTIVE ADVANCED PF OP), Place 1 drop into both eyes 3 (three) times daily as needed (FOR DRYNESS)., Disp: , Rfl:    Cholecalciferol (VITAMIN D3) 50 MCG (2000 UT) capsule, Take 1 capsule (2,000 Units total) by mouth daily., Disp: 100 capsule, Rfl: 3   EPINEPHrine  (EPIPEN  2-PAK) 0.3 mg/0.3 mL IJ SOAJ injection, Inject 0.3 mg into the muscle as needed for anaphylaxis., Disp: 2 each, Rfl: 0   FLUoxetine  (PROZAC ) 10 MG capsule, Take 1 capsule (10 mg total) by mouth daily., Disp: 30 capsule, Rfl: 0   hydrOXYzine  (ATARAX ) 10 MG tablet, Take 1 tablet (10 mg total) by mouth 3 (three) times daily as needed., Disp: 30 tablet, Rfl: 0   levothyroxine  (SYNTHROID ) 125 MCG tablet, Take 1 tablet (125 mcg total) by mouth daily at 6 (six) AM., Disp: 30 tablet, Rfl: 0   Multiple Vitamins-Minerals (CENTRUM SILVER  ADULT 50+) TABS, 1 po qd, Disp: 100 tablet, Rfl:  3   OVER THE COUNTER MEDICATION, Take 1-2 tablets by mouth See admin instructions. Advil  Dual Action tablets/caplets: Take 1-2 caplets by mouth every 8 hours as needed for headaches or pain, Disp: , Rfl:    OVER THE COUNTER MEDICATION, Take 1 capsule by mouth See admin instructions. AZO Dual Protection Urinary + Vaginal Support Capsules: Take 1 capsule by mouth once a day (prebiotic + probiotic), Disp: , Rfl:    sertraline  (ZOLOFT ) 25 MG tablet, Give Sertraline  25 for 7 days stop as we are cross tapering for Prozac ., Disp: 7 tablet, Rfl: 7   traZODone  (DESYREL ) 50 MG tablet, Take 1 tablet (50 mg total) by mouth at bedtime for 15 days., Disp: 15 tablet, Rfl: 0  Observations/Objective: Patient is well-developed, well-nourished in no acute distress.  Resting comfortably at home.  Head is normocephalic, atraumatic.  No labored breathing.  Speech is clear and coherent with logical content.  Patient is alert and oriented at baseline.    Assessment and Plan:  1. Thrush of mouth and esophagus (HCC) (Primary) - fluconazole  (DIFLUCAN ) 150 MG tablet; Take 2 tablets on day 1 then 1 tablet on days 2-10  Dispense: 11 tablet; Refill: 0   Follow Up Instructions: I discussed the assessment and treatment plan with the patient. The patient was provided an opportunity to ask questions and all were answered. The patient agreed with the plan and demonstrated an understanding of the instructions.  A copy of instructions were sent to the patient via MyChart unless otherwise noted below.    The patient was advised to call back or seek an in-person evaluation if the symptoms worsen or if the condition fails to improve as anticipated.    Cassity Christian W Lizzie Cokley, NP

## 2023-12-22 NOTE — Patient Instructions (Signed)
 Linda Hayes, thank you for joining [name], NP for today's virtual visit.  While this provider is not your primary care provider (PCP), if your PCP is located in our provider database this encounter information will be shared with them immediately following your visit.   A [facility] MyChart account gives you access to today's visit and all your visits, tests, and labs performed at [facility] " click here if you don't have a [facility] MyChart account or go to [email or web address]  Consent: (Patient) Linda Hayes provided verbal consent for this virtual visit at the beginning of the encounter.  Current Medications:  Current Outpatient Medications:    fluconazole  (DIFLUCAN ) 150 MG tablet, Take 2 tablets on day 1 then 1 tablet on days 2-10, Disp: 11 tablet, Rfl: 0   ADVIL  200 MG CAPS, Take 200-600 mg by mouth every 6 (six) hours as needed (for pain or headaches)., Disp: , Rfl:    Carboxymeth-Glycerin-Polysorb (REFRESH OPTIVE ADVANCED PF OP), Place 1 drop into both eyes 3 (three) times daily as needed (FOR DRYNESS)., Disp: , Rfl:    Cholecalciferol (VITAMIN D3) 50 MCG (2000 UT) capsule, Take 1 capsule (2,000 Units total) by mouth daily., Disp: 100 capsule, Rfl: 3   EPINEPHrine  (EPIPEN  2-PAK) 0.3 mg/0.3 mL IJ SOAJ injection, Inject 0.3 mg into the muscle as needed for anaphylaxis., Disp: 2 each, Rfl: 0   FLUoxetine  (PROZAC ) 10 MG capsule, Take 1 capsule (10 mg total) by mouth daily., Disp: 30 capsule, Rfl: 0   hydrOXYzine  (ATARAX ) 10 MG tablet, Take 1 tablet (10 mg total) by mouth 3 (three) times daily as needed., Disp: 30 tablet, Rfl: 0   levothyroxine  (SYNTHROID ) 125 MCG tablet, Take 1 tablet (125 mcg total) by mouth daily at 6 (six) AM., Disp: 30 tablet, Rfl: 0   Multiple Vitamins-Minerals (CENTRUM SILVER  ADULT 50+) TABS, 1 po qd, Disp: 100 tablet, Rfl: 3   OVER THE COUNTER MEDICATION, Take 1-2 tablets by mouth See admin instructions. Advil  Dual Action  tablets/caplets: Take 1-2 caplets by mouth every 8 hours as needed for headaches or pain, Disp: , Rfl:    OVER THE COUNTER MEDICATION, Take 1 capsule by mouth See admin instructions. AZO Dual Protection Urinary + Vaginal Support Capsules: Take 1 capsule by mouth once a day (prebiotic + probiotic), Disp: , Rfl:    sertraline  (ZOLOFT ) 25 MG tablet, Give Sertraline  25 for 7 days stop as we are cross tapering for Prozac ., Disp: 7 tablet, Rfl: 7   traZODone  (DESYREL ) 50 MG tablet, Take 1 tablet (50 mg total) by mouth at bedtime for 15 days., Disp: 15 tablet, Rfl: 0   Medications ordered in this encounter:  Meds ordered this encounter  Medications   fluconazole  (DIFLUCAN ) 150 MG tablet    Sig: Take 2 tablets on day 1 then 1 tablet on days 2-10    Dispense:  11 tablet    Refill:  0    Supervising Provider:   Corine Dice [phone]     *If you need refills on other medications prior to your next appointment, please contact your pharmacy*  Follow-Up: Call back or seek an in-person evaluation if the symptoms worsen or if the condition fails to improve as anticipated.  [facility] [phone]   If you have been instructed to have an in-person evaluation today at a local Urgent Care facility, please use the link below. It will take you to a list of all of our available [facility], including address, phone number and hours of operation. Please do not delay care.  [facility]  If you or a family member do not have a primary care provider, use the link below to schedule a visit and establish care. When you choose a [place] primary care physician or advanced practice provider, you gain a long-term partner in health. Find a Primary Care Provider  Learn more about [facility]'s in-office and virtual care options: [facility] - Get Care Now

## 2023-12-23 ENCOUNTER — Encounter: Payer: Self-pay | Admitting: Internal Medicine

## 2023-12-23 NOTE — Progress Notes (Deleted)
    Subjective:    Patient ID: Linda Hayes, female    DOB: 04/17/1951, 73 y.o.   MRN: [id number]      HPI Linda Hayes is here for No chief complaint on file.    Numbness in feet/legs   Increased anxiety - taking fluoxetine  10 mg daily, hydroxyzine  10 mg tid prn.   Recently in hospital after suicide attempt by taking 20-30 xanax  tablets.      Medications and allergies reviewed with patient and updated if appropriate.  Current Outpatient Medications on File Prior to Visit  Medication Sig Dispense Refill   ADVIL  200 MG CAPS Take 200-600 mg by mouth every 6 (six) hours as needed (for pain or headaches).     Carboxymeth-Glycerin-Polysorb (REFRESH OPTIVE ADVANCED PF OP) Place 1 drop into both eyes 3 (three) times daily as needed (FOR DRYNESS).     Cholecalciferol (VITAMIN D3) 50 MCG (2000 UT) capsule Take 1 capsule (2,000 Units total) by mouth daily. 100 capsule 3   EPINEPHrine  (EPIPEN  2-PAK) 0.3 mg/0.3 mL IJ SOAJ injection Inject 0.3 mg into the muscle as needed for anaphylaxis. 2 each 0   fluconazole  (DIFLUCAN ) 150 MG tablet Take 2 tablets on day 1 then 1 tablet on days 2-10 11 tablet 0   FLUoxetine  (PROZAC ) 10 MG capsule Take 1 capsule (10 mg total) by mouth daily. 30 capsule 0   hydrOXYzine  (ATARAX ) 10 MG tablet Take 1 tablet (10 mg total) by mouth 3 (three) times daily as needed. 30 tablet 0   levothyroxine  (SYNTHROID ) 125 MCG tablet Take 1 tablet (125 mcg total) by mouth daily at 6 (six) AM. 30 tablet 0   Multiple Vitamins-Minerals (CENTRUM SILVER  ADULT 50+) TABS 1 po qd 100 tablet 3   OVER THE COUNTER MEDICATION Take 1-2 tablets by mouth See admin instructions. Advil  Dual Action tablets/caplets: Take 1-2 caplets by mouth every 8 hours as needed for headaches or pain     OVER THE COUNTER MEDICATION Take 1 capsule by mouth See admin instructions. AZO Dual Protection Urinary + Vaginal Support Capsules: Take 1 capsule by mouth once a day (prebiotic + probiotic)     sertraline  (ZOLOFT )  25 MG tablet Give Sertraline  25 for 7 days stop as we are cross tapering for Prozac . 7 tablet 7   traZODone  (DESYREL ) 50 MG tablet Take 1 tablet (50 mg total) by mouth at bedtime for 15 days. 15 tablet 0   No current facility-administered medications on file prior to visit.    Review of Systems     Objective:  There were no vitals filed for this visit. BP Readings from Last 3 Encounters:  12/18/23 97/64  12/03/23 (!) 100/58  08/16/23 110/68   Wt Readings from Last 3 Encounters:  12/11/23 163 lb (73.9 kg)  12/03/23 162 lb (73.5 kg)  07/10/23 186 lb (84.4 kg)   There is no height or weight on file to calculate BMI.    Physical Exam         Assessment & Plan:    See Problem List for Assessment and Plan of chronic medical problems.

## 2023-12-23 NOTE — Patient Instructions (Incomplete)
   Blood work was ordered.       Medications changes include :   decrease lasix to 20 mg daily and decrease potassium to 20 mg daily   A referral was ordered for the heart failure clinic.      Return in about 2 months (around 09/02/2023) for follow up.

## 2023-12-24 ENCOUNTER — Telehealth: Admitting: Internal Medicine

## 2023-12-24 ENCOUNTER — Ambulatory Visit (INDEPENDENT_AMBULATORY_CARE_PROVIDER_SITE_OTHER): Admitting: Internal Medicine

## 2023-12-24 ENCOUNTER — Telehealth: Payer: Self-pay | Admitting: *Deleted

## 2023-12-24 ENCOUNTER — Encounter: Payer: Self-pay | Admitting: Internal Medicine

## 2023-12-24 ENCOUNTER — Telehealth: Payer: Self-pay

## 2023-12-24 VITALS — BP 106/78 | HR 82 | Temp 98.3°F | Ht 64.0 in | Wt 162.0 lb

## 2023-12-24 DIAGNOSIS — D649 Anemia, unspecified: Secondary | ICD-10-CM | POA: Diagnosis not present

## 2023-12-24 DIAGNOSIS — F419 Anxiety disorder, unspecified: Secondary | ICD-10-CM | POA: Diagnosis not present

## 2023-12-24 DIAGNOSIS — F331 Major depressive disorder, recurrent, moderate: Secondary | ICD-10-CM | POA: Diagnosis not present

## 2023-12-24 DIAGNOSIS — T424X2D Poisoning by benzodiazepines, intentional self-harm, subsequent encounter: Secondary | ICD-10-CM | POA: Diagnosis not present

## 2023-12-24 DIAGNOSIS — F0390 Unspecified dementia without behavioral disturbance: Secondary | ICD-10-CM | POA: Diagnosis not present

## 2023-12-24 DIAGNOSIS — N3001 Acute cystitis with hematuria: Secondary | ICD-10-CM | POA: Diagnosis not present

## 2023-12-24 DIAGNOSIS — T1491XD Suicide attempt, subsequent encounter: Secondary | ICD-10-CM | POA: Diagnosis not present

## 2023-12-24 MED ORDER — HYDROXYZINE PAMOATE 25 MG PO CAPS: 25.0000 mg | ORAL_CAPSULE | Freq: Three times a day (TID) | ORAL | 0 refills | Status: DC | PRN

## 2023-12-24 MED ORDER — DESVENLAFAXINE SUCCINATE ER 50 MG PO TB24: 50.0000 mg | ORAL_TABLET | Freq: Every day | ORAL | 3 refills | Status: DC

## 2023-12-24 NOTE — Progress Notes (Unsigned)
 Complex Care Management Note Care Guide Note  12/24/2023 Name: Linda Hayes MRN: [id number] DOB: [birth year]   Complex Care Management Outreach Attempts: An unsuccessful telephone outreach was attempted today to offer the patient information about available complex care management services.  Follow Up Plan:  Additional outreach attempts will be made to offer the patient complex care management information and services.   Encounter Outcome:  No Answer  Kandis Ormond, CMA, [place]  [facility], [facility] Guide Direct Dial: [phone]  Fax: [phone] Website: [email or web address]

## 2023-12-24 NOTE — Progress Notes (Signed)
 Subjective:    Patient ID: Linda Hayes, female    DOB: 08-01-1951, 73 y.o.   MRN: [id number]      HPI Linda Hayes is here for  Chief Complaint  Patient presents with   Numbness    Numbness in mouth (due to thrush)   Anxiety    Before she went to hospital she was on Xanax  and other medications they stopped her medication; Overdosed on medication (seen in hospital)    Was on sertraline , xanax .  Husband passed away in 09-14-23 and moved in with her daughter.  Recently she was hospitalized for suicide attempt.  That night she just felt like she was told to take more Xanax  pills.  While in the hospital they stopped the Xanax  and sertraline .  They started her on fluoxetine  10 mg daily, hydroxyzine  10 mg 3 times daily and trazodone  50 mg at night which she is taking.  She is extremely anxious and is not sleeping.  Medications did not seem to be helping.  The Xanax  was also helping her body spasms and she is not seeing as well wean off of it.  She has had several falls.   Thrush in mouth - can't eat - on diflucan .  That has helped.    incontience,   Medications and allergies reviewed with patient and updated if appropriate.  Current Outpatient Medications on File Prior to Visit  Medication Sig Dispense Refill   ADVIL  200 MG CAPS Take 200-600 mg by mouth every 6 (six) hours as needed (for pain or headaches).     Carboxymeth-Glycerin-Polysorb (REFRESH OPTIVE ADVANCED PF OP) Place 1 drop into both eyes 3 (three) times daily as needed (FOR DRYNESS).     Cholecalciferol (VITAMIN D3) 50 MCG (2000 UT) capsule Take 1 capsule (2,000 Units total) by mouth daily. 100 capsule 3   EPINEPHrine  (EPIPEN  2-PAK) 0.3 mg/0.3 mL IJ SOAJ injection Inject 0.3 mg into the muscle as needed for anaphylaxis. 2 each 0   fluconazole  (DIFLUCAN ) 150 MG tablet Take 2 tablets on day 1 then 1 tablet on days 2-10 11 tablet 0   FLUoxetine  (PROZAC ) 10 MG capsule Take 1 capsule (10 mg total) by mouth daily. 30 capsule 0    hydrOXYzine  (ATARAX ) 10 MG tablet Take 1 tablet (10 mg total) by mouth 3 (three) times daily as needed. 30 tablet 0   levothyroxine  (SYNTHROID ) 125 MCG tablet Take 1 tablet (125 mcg total) by mouth daily at 6 (six) AM. 30 tablet 0   Multiple Vitamins-Minerals (CENTRUM SILVER  ADULT 50+) TABS 1 po qd 100 tablet 3   OVER THE COUNTER MEDICATION Take 1-2 tablets by mouth See admin instructions. Advil  Dual Action tablets/caplets: Take 1-2 caplets by mouth every 8 hours as needed for headaches or pain     OVER THE COUNTER MEDICATION Take 1 capsule by mouth See admin instructions. AZO Dual Protection Urinary + Vaginal Support Capsules: Take 1 capsule by mouth once a day (prebiotic + probiotic)     traZODone  (DESYREL ) 50 MG tablet Take 1 tablet (50 mg total) by mouth at bedtime for 15 days. 15 tablet 0   No current facility-administered medications on file prior to visit.    Review of Systems     Objective:   Vitals:   12/24/23 1529  BP: 106/78  Pulse: 82  Temp: 98.3 F (36.8 C)  SpO2: 96%   BP Readings from Last 3 Encounters:  12/24/23 106/78  12/18/23 97/64  12/03/23 (!) 100/58   [employer]  Readings from Last 3 Encounters:  12/24/23 162 lb (73.5 kg)  12/11/23 163 lb (73.9 kg)  12/03/23 162 lb (73.5 kg)   Body mass index is 27.81 kg/m.    Physical Exam Constitutional:      General: She is not in acute distress.    Appearance: Normal appearance. She is not ill-appearing.  HENT:     Head: Normocephalic and atraumatic.  Skin:    General: Skin is warm and dry.  Neurological:     Mental Status: She is alert. Mental status is at baseline.  Psychiatric:        Behavior: Behavior normal.        Thought Content: Thought content normal.        Judgment: Judgment normal.     Comments: Very anxious            Assessment & Plan:    Anxiety disorder: Acute on chronic Long-term anxiety and has been on Zoloft  and high dose Xanax  for a while Recent suicide attempt with Xanax   resulted in hospitalization and change in medication Medications are not working well and she is here for follow-up She did recently do a genesight test showing the Pristiq would work well for her Stop fluoxetine -start Pristiq 50 mg daily Increase hydroxyzine  to 25 mg 3 times daily-monitor if this is too much because of increased risk of falling and being too sedated Continue trazodone  50 mg nightly for now-this is not working well, but I do not want to change it to something off at this time given other changes  Advised that she needs close follow-up in the next 2-4 weeks, sooner if needed Discussed possibly seeing a psychiatrist to help manage her medication and she will think about that, if not needs to follow-up with PCP

## 2023-12-24 NOTE — Patient Instructions (Addendum)
     Medications changes include :     Stop fluoxetine .   Start pristiq 50 mg daily.  Increase hydroxyzine  to 25 mg three times a day as needed.       You need close follow up with your primary care physician or a psychiatrist in 3-4 weeks     Some psychiatrist you can consider  [facility] at [facility] [phone]  [facility]            [address] [place] [phone]  Dr Isidore Mares [facility] [address] [place] 952 [phone]

## 2023-12-25 ENCOUNTER — Telehealth: Payer: Self-pay

## 2023-12-25 DIAGNOSIS — N3281 Overactive bladder: Secondary | ICD-10-CM

## 2023-12-25 DIAGNOSIS — N3000 Acute cystitis without hematuria: Secondary | ICD-10-CM

## 2023-12-25 DIAGNOSIS — Z9181 History of falling: Secondary | ICD-10-CM

## 2023-12-25 NOTE — Progress Notes (Unsigned)
 Complex Care Management Note Care Guide Note  12/25/2023 Name: Linda Hayes MRN: [id number] DOB: 1950-10-03   Complex Care Management Outreach Attempts: A second unsuccessful outreach was attempted today to offer the patient with information about available complex care management services.  Follow Up Plan:  Additional outreach attempts will be made to offer the patient complex care management information and services.   Encounter Outcome:  No Answer  Kandis Ormond, CMA [place]  [facility], [facility] Guide Direct Dial: [phone]  Fax: [phone] Website: [email or web address]

## 2023-12-25 NOTE — Telephone Encounter (Signed)
 Copied from CRM [id number]. Topic: Clinical - Home Health Verbal Orders >> Dec 24, 2023  4:44 PM Crispin Dolphin wrote: Caller/Agency: Marikay Show [facility] Callback Number: [phone] Service Requested: Physical Therapy Frequency: 2 week for 3 weeks, 1 per week for 5 -  Any new concerns about the patient? Yes - no concerns but also need order script bedside commode 3 in 1. Fax: [phone]

## 2023-12-26 ENCOUNTER — Telehealth: Payer: Self-pay | Admitting: Internal Medicine

## 2023-12-26 DIAGNOSIS — F419 Anxiety disorder, unspecified: Secondary | ICD-10-CM

## 2023-12-26 DIAGNOSIS — T1491XD Suicide attempt, subsequent encounter: Secondary | ICD-10-CM | POA: Diagnosis not present

## 2023-12-26 DIAGNOSIS — F332 Major depressive disorder, recurrent severe without psychotic features: Secondary | ICD-10-CM

## 2023-12-26 DIAGNOSIS — T424X2D Poisoning by benzodiazepines, intentional self-harm, subsequent encounter: Secondary | ICD-10-CM | POA: Diagnosis not present

## 2023-12-26 DIAGNOSIS — F331 Major depressive disorder, recurrent, moderate: Secondary | ICD-10-CM | POA: Diagnosis not present

## 2023-12-26 DIAGNOSIS — F5104 Psychophysiologic insomnia: Secondary | ICD-10-CM

## 2023-12-26 DIAGNOSIS — F0390 Unspecified dementia without behavioral disturbance: Secondary | ICD-10-CM | POA: Diagnosis not present

## 2023-12-26 DIAGNOSIS — D649 Anemia, unspecified: Secondary | ICD-10-CM | POA: Diagnosis not present

## 2023-12-26 DIAGNOSIS — N3001 Acute cystitis with hematuria: Secondary | ICD-10-CM | POA: Diagnosis not present

## 2023-12-26 NOTE — Progress Notes (Signed)
 Complex Care Management Note  Care Guide Note 12/26/2023 Name: Linda Hayes MRN: [id number] DOB: 09/29/1950  Linda Hayes is a 73 y.o. year old female who sees Plotnikov, Aleksei V, MD for primary care. I reached out to Ramsey Burows by phone today to offer complex care management services.  [name] was given information about Complex Care Management services today including:   The Complex Care Management services include support from the care team which includes your Nurse Care Manager, Clinical Social Worker, or Pharmacist.  The Complex Care Management team is here to help remove barriers to the health concerns and goals most important to you. Complex Care Management services are voluntary, and the patient may decline or stop services at any time by request to their care team member.   Complex Care Management Consent Status: Patient did not agree to participate in complex care management services at this time.  Follow up plan:  pt declined services says she has established with therapist   Encounter Outcome:  Patient Refused  Kandis Ormond, CMA [place]  [facility], [facility] Guide Direct Dial: [phone]  Fax: [phone] Website: [email or web address]

## 2023-12-26 NOTE — Telephone Encounter (Signed)
 Copied from CRM [id number]. Topic: Referral - Request for Referral >> Dec 26, 2023 12:15 PM Annelle Kiel wrote: Did the patient discuss referral with their provider in the last year? Yes (If No - schedule appointment) (If Yes - send message)  Appointment offered? No  Type of order/referral and detailed reason for visit: psychiatrists  the one she was referred to is not in network    Preference of office, provider, location: psychiatrists   dr  [facility] [phone]  If referral order, have you been seen by this specialty before? No (If Yes, this issue or another issue? When? Where?  Can we respond through MyChart? No  ---  Checked pt's most recent referral # [id number] . It was closed today with a note " Pt declined services at this time - says she has established with therapist "  Unsure of how to proceed, please assist.

## 2023-12-26 NOTE — Telephone Encounter (Signed)
Okay with me. Thanks 

## 2023-12-27 ENCOUNTER — Other Ambulatory Visit: Payer: Self-pay | Admitting: Internal Medicine

## 2023-12-27 NOTE — Telephone Encounter (Signed)
 LDV for Rice Chamorro with [facility] for Verbal [place] Physical Therapy Frequency: 2 week for 3 weeks, 1 per week for 5 -

## 2023-12-27 NOTE — Addendum Note (Signed)
 Addended by: Hildegard Low R on: 12/27/2023 09:30 AM   Modules accepted: Orders

## 2023-12-27 NOTE — Telephone Encounter (Signed)
 Called and relayed verbal orders to [facility] at [facility]

## 2023-12-28 ENCOUNTER — Other Ambulatory Visit: Payer: Self-pay | Admitting: Internal Medicine

## 2023-12-28 NOTE — Telephone Encounter (Signed)
 Do I need to place another referral for a psychiatrist?  Thanks

## 2023-12-31 NOTE — Telephone Encounter (Signed)
 Will do. Thank you

## 2024-01-01 ENCOUNTER — Other Ambulatory Visit: Payer: Self-pay | Admitting: Internal Medicine

## 2024-01-01 DIAGNOSIS — F331 Major depressive disorder, recurrent, moderate: Secondary | ICD-10-CM | POA: Diagnosis not present

## 2024-01-01 DIAGNOSIS — N3001 Acute cystitis with hematuria: Secondary | ICD-10-CM | POA: Diagnosis not present

## 2024-01-01 DIAGNOSIS — D649 Anemia, unspecified: Secondary | ICD-10-CM | POA: Diagnosis not present

## 2024-01-01 DIAGNOSIS — T1491XD Suicide attempt, subsequent encounter: Secondary | ICD-10-CM | POA: Diagnosis not present

## 2024-01-01 DIAGNOSIS — T424X2D Poisoning by benzodiazepines, intentional self-harm, subsequent encounter: Secondary | ICD-10-CM | POA: Diagnosis not present

## 2024-01-01 DIAGNOSIS — F0390 Unspecified dementia without behavioral disturbance: Secondary | ICD-10-CM | POA: Diagnosis not present

## 2024-01-01 NOTE — Telephone Encounter (Signed)
 Copied from CRM [id number]. Topic: Clinical - Medical Advice >> Jan 01, 2024 10:47 AM Baldo Levan wrote: Reason for CRM: Patient and her home health nurse called in stating that she is currently taking hydrOXYzine  (VISTARIL ) 25 MG capsule [[id number]]. Patient believes this medication is causing her dry mouth and trouble with her heart rates. The patients nurse stated she is taking every 8 hours and believes this could be too much, but she needs something for anxiety. Patient also stated that she would like the doctor to know she has been having stinging when urinating. Patient would like a call back at [phone]. Patient has an appointment scheduled for 05/29. >> Jan 01, 2024 11:07 AM Adonis Hoot wrote: Home health nurse called in back I and wanted to add that patient hasn't had a B, since 12/26/2023.

## 2024-01-02 ENCOUNTER — Telehealth: Payer: Self-pay

## 2024-01-02 ENCOUNTER — Other Ambulatory Visit: Payer: Self-pay | Admitting: Internal Medicine

## 2024-01-02 ENCOUNTER — Encounter: Payer: Self-pay | Admitting: Internal Medicine

## 2024-01-02 DIAGNOSIS — F0390 Unspecified dementia without behavioral disturbance: Secondary | ICD-10-CM | POA: Diagnosis not present

## 2024-01-02 DIAGNOSIS — F331 Major depressive disorder, recurrent, moderate: Secondary | ICD-10-CM | POA: Diagnosis not present

## 2024-01-02 DIAGNOSIS — N3001 Acute cystitis with hematuria: Secondary | ICD-10-CM | POA: Diagnosis not present

## 2024-01-02 DIAGNOSIS — T424X2D Poisoning by benzodiazepines, intentional self-harm, subsequent encounter: Secondary | ICD-10-CM | POA: Diagnosis not present

## 2024-01-02 DIAGNOSIS — D649 Anemia, unspecified: Secondary | ICD-10-CM | POA: Diagnosis not present

## 2024-01-02 DIAGNOSIS — T1491XD Suicide attempt, subsequent encounter: Secondary | ICD-10-CM | POA: Diagnosis not present

## 2024-01-02 NOTE — Telephone Encounter (Signed)
 Copied from CRM [id number]. Topic: Clinical - Medication Refill >> Jan 02, 2024  2:09 PM Oddis Bench wrote: Medication: traZODone  (DESYREL ) 50 MG table  Has the patient contacted their pharmacy? Yes Stated to contact PCp  This is the patient's preferred pharmacy:  [id number] [place], [place] - [address] [place] [address] Phone: [phone] Fax: [phone]    Is this the correct pharmacy for this prescription? Yes If no, delete pharmacy and type the correct one.   Has the prescription been filled recently? No  Is the patient out of the medication? Yes  Has the patient been seen for an appointment in the last year OR does the patient have an upcoming appointment? Yes  Can we respond through MyChart? No  Agent: Please be advised that Rx refills may take up to 3 business days. We ask that you follow-up with your pharmacy.

## 2024-01-02 NOTE — Telephone Encounter (Signed)
 Copied from CRM [id number]. Topic: General - Other >> Jan 02, 2024 10:25 AM Magdalene School wrote: Reason for CRM: Aleda Hurl, patient's home health nurse calling to check on status of shower chair order which she stated was sent on 12th or 13th of may 2025.  Call back number [phone]

## 2024-01-02 NOTE — Telephone Encounter (Signed)
 Copied from CRM [id number]. Topic: Clinical - Medication Refill >> Jan 02, 2024 10:22 AM Vivian Z wrote: Medication: traZODone  (DESYREL ) 50 MG tablet  Has the patient contacted their pharmacy? Yes (Agent: If no, request that the patient contact the pharmacy for the refill. If patient does not wish to contact the pharmacy document the reason why and proceed with request.) (Agent: If yes, when and what did the pharmacy advise?) Out of refills.  This is the patient's preferred pharmacy:  [id number] [place], [place] - [address] [place] [address] Phone: [phone] Fax: [phone]  Is this the correct pharmacy for this prescription? Yes If no, delete pharmacy and type the correct one.   Has the prescription been filled recently? No  Is the patient out of the medication? Yes  Has the patient been seen for an appointment in the last year OR does the patient have an upcoming appointment? Yes  Can we respond through MyChart? No  Agent: Please be advised that Rx refills may take up to 3 business days. We ask that you follow-up with your pharmacy.

## 2024-01-03 MED ORDER — TRAZODONE HCL 50 MG PO TABS: 50.0000 mg | ORAL_TABLET | Freq: Every day | ORAL | 0 refills | Status: DC

## 2024-01-03 NOTE — Telephone Encounter (Signed)
 It was renewed for 30 days.  Maegan needs to follow-up with her psychiatrist

## 2024-01-08 DIAGNOSIS — F331 Major depressive disorder, recurrent, moderate: Secondary | ICD-10-CM | POA: Diagnosis not present

## 2024-01-08 DIAGNOSIS — D649 Anemia, unspecified: Secondary | ICD-10-CM | POA: Diagnosis not present

## 2024-01-08 DIAGNOSIS — F0390 Unspecified dementia without behavioral disturbance: Secondary | ICD-10-CM | POA: Diagnosis not present

## 2024-01-08 DIAGNOSIS — T424X2D Poisoning by benzodiazepines, intentional self-harm, subsequent encounter: Secondary | ICD-10-CM | POA: Diagnosis not present

## 2024-01-08 DIAGNOSIS — T1491XD Suicide attempt, subsequent encounter: Secondary | ICD-10-CM | POA: Diagnosis not present

## 2024-01-08 DIAGNOSIS — N3001 Acute cystitis with hematuria: Secondary | ICD-10-CM | POA: Diagnosis not present

## 2024-01-09 ENCOUNTER — Telehealth: Payer: Self-pay | Admitting: Internal Medicine

## 2024-01-09 NOTE — Telephone Encounter (Signed)
 Copied from CRM [id number]. Topic: Clinical - Home Health Verbal Orders >> Jan 09, 2024  8:55 AM Dimple Francis wrote: Caller/Agency: [facility] Callback Number: [phone] Service Requested: Occupational Therapy Frequency: 1 time a week for 4 weeks Any new concerns about the patient? No

## 2024-01-10 ENCOUNTER — Ambulatory Visit (INDEPENDENT_AMBULATORY_CARE_PROVIDER_SITE_OTHER): Admitting: Internal Medicine

## 2024-01-10 ENCOUNTER — Encounter: Payer: Self-pay | Admitting: Internal Medicine

## 2024-01-10 VITALS — BP 116/48 | HR 72 | Temp 98.3°F | Ht 64.0 in | Wt 164.0 lb

## 2024-01-10 DIAGNOSIS — E890 Postprocedural hypothyroidism: Secondary | ICD-10-CM | POA: Diagnosis not present

## 2024-01-10 DIAGNOSIS — F419 Anxiety disorder, unspecified: Secondary | ICD-10-CM

## 2024-01-10 DIAGNOSIS — F331 Major depressive disorder, recurrent, moderate: Secondary | ICD-10-CM | POA: Diagnosis not present

## 2024-01-10 DIAGNOSIS — G250 Essential tremor: Secondary | ICD-10-CM | POA: Insufficient documentation

## 2024-01-10 DIAGNOSIS — R350 Frequency of micturition: Secondary | ICD-10-CM

## 2024-01-10 DIAGNOSIS — F332 Major depressive disorder, recurrent severe without psychotic features: Secondary | ICD-10-CM | POA: Diagnosis not present

## 2024-01-10 DIAGNOSIS — T424X2A Poisoning by benzodiazepines, intentional self-harm, initial encounter: Secondary | ICD-10-CM

## 2024-01-10 LAB — CBC WITH DIFFERENTIAL/PLATELET
Basophils Absolute: 0.1 10*3/uL (ref 0.0–0.1)
Basophils Relative: 0.8 % (ref 0.0–3.0)
Eosinophils Absolute: 0.1 10*3/uL (ref 0.0–0.7)
Eosinophils Relative: 1.3 % (ref 0.0–5.0)
HCT: 40.6 % (ref 36.0–46.0)
Hemoglobin: 13.9 g/dL (ref 12.0–15.0)
Lymphocytes Relative: 38 % (ref 12.0–46.0)
Lymphs Abs: 2.6 10*3/uL (ref 0.7–4.0)
MCHC: 34.3 g/dL (ref 30.0–36.0)
MCV: 93.9 fl (ref 78.0–100.0)
Monocytes Absolute: 0.5 10*3/uL (ref 0.1–1.0)
Monocytes Relative: 6.8 % (ref 3.0–12.0)
Neutro Abs: 3.6 10*3/uL (ref 1.4–7.7)
Neutrophils Relative %: 53.1 % (ref 43.0–77.0)
Platelets: 228 10*3/uL (ref 150.0–400.0)
RBC: 4.33 Mil/uL (ref 3.87–5.11)
RDW: 13.4 % (ref 11.5–15.5)
WBC: 6.8 10*3/uL (ref 4.0–10.5)

## 2024-01-10 LAB — URINALYSIS, ROUTINE W REFLEX MICROSCOPIC
Bilirubin Urine: NEGATIVE
Hgb urine dipstick: NEGATIVE
Nitrite: NEGATIVE
Specific Gravity, Urine: 1.02 (ref 1.000–1.030)
Urine Glucose: NEGATIVE
Urobilinogen, UA: 0.2 (ref 0.0–1.0)
pH: 6 (ref 5.0–8.0)

## 2024-01-10 LAB — COMPREHENSIVE METABOLIC PANEL WITH GFR
ALT: 13 U/L (ref 0–35)
AST: 19 U/L (ref 0–37)
Albumin: 4.1 g/dL (ref 3.5–5.2)
Alkaline Phosphatase: 52 U/L (ref 39–117)
BUN: 18 mg/dL (ref 6–23)
CO2: 27 meq/L (ref 19–32)
Calcium: 9.2 mg/dL (ref 8.4–10.5)
Chloride: 105 meq/L (ref 96–112)
Creatinine, Ser: 0.8 mg/dL (ref 0.40–1.20)
GFR: 73.89 mL/min (ref >60.00)
Glucose, Bld: 95 mg/dL (ref 70–99)
Potassium: 4.1 meq/L (ref 3.5–5.1)
Sodium: 139 meq/L (ref 135–145)
Total Bilirubin: 0.6 mg/dL (ref 0.2–1.2)
Total Protein: 6.8 g/dL (ref 6.0–8.3)

## 2024-01-10 LAB — TSH: TSH: 0.04 u[IU]/mL — ABNORMAL LOW (ref 0.35–5.50)

## 2024-01-10 MED ORDER — ALPRAZOLAM 1 MG PO TABS: 1.0000 mg | ORAL_TABLET | Freq: Two times a day (BID) | ORAL | 1 refills | Status: DC

## 2024-01-10 NOTE — Assessment & Plan Note (Signed)
 Worsening tremor, tics and anxiety off Xanax  Restart Xanax  1 mg bid - dtr will dispense 2 tab a day

## 2024-01-10 NOTE — Assessment & Plan Note (Signed)
 Suicidal attempt 12/2023 Will ref to [facility] on Pristiq  50 mg/d

## 2024-01-10 NOTE — Progress Notes (Signed)
 Subjective:  Patient ID: Linda Hayes, female    DOB: 11-23-1950  Age: 73 y.o. MRN: [id number]  CC: Medical Management of Chronic Issues (4 week f/u and referral for psychiatrist, pt is concerned about UTI.. Pt has concerns about medications giving her dry eyes and dry mouth... she is not taking the Hydroxyzine  and has been only doing 1/2 a tab of the Trazodone  )   HPI Linda Hayes presents for a post-hospital visit - d/c'd on Dec 18 2023 Taking Pristiq  50 mg, 1/2 Trazodone  Lives w/dtr [name] now Here w/Michelle and her husband In PT/OT   Outpatient Medications Prior to Visit  Medication Sig Dispense Refill   ADVIL  200 MG CAPS Take 200-600 mg by mouth every 6 (six) hours as needed (for pain or headaches).     Carboxymeth-Glycerin-Polysorb (REFRESH OPTIVE ADVANCED PF OP) Place 1 drop into both eyes 3 (three) times daily as needed (FOR DRYNESS).     Cholecalciferol (VITAMIN D3) 50 MCG (2000 UT) capsule Take 1 capsule (2,000 Units total) by mouth daily. 100 capsule 3   desvenlafaxine  (PRISTIQ ) 50 MG 24 hr tablet Take 1 tablet (50 mg total) by mouth daily. 30 tablet 3   EPINEPHrine  (EPIPEN  2-PAK) 0.3 mg/0.3 mL IJ SOAJ injection Inject 0.3 mg into the muscle as needed for anaphylaxis. 2 each 0   levothyroxine  (SYNTHROID ) 125 MCG tablet Take 1 tablet (125 mcg total) by mouth daily at 6 (six) AM. 30 tablet 0   Multiple Vitamins-Minerals (CENTRUM SILVER  ADULT 50+) TABS 1 po qd 100 tablet 3   OVER THE COUNTER MEDICATION Take 1-2 tablets by mouth See admin instructions. Advil  Dual Action tablets/caplets: Take 1-2 caplets by mouth every 8 hours as needed for headaches or pain     OVER THE COUNTER MEDICATION Take 1 capsule by mouth See admin instructions. AZO Dual Protection Urinary + Vaginal Support Capsules: Take 1 capsule by mouth once a day (prebiotic + probiotic)     traZODone  (DESYREL ) 50 MG tablet Take 1 tablet (50 mg total) by mouth at bedtime for 15 days. 30 tablet 0   fluconazole   (DIFLUCAN ) 150 MG tablet Take 2 tablets on day 1 then 1 tablet on days 2-10 11 tablet 0   hydrOXYzine  (VISTARIL ) 25 MG capsule Take 1 capsule (25 mg total) by mouth every 8 (eight) hours as needed for anxiety. 90 capsule 0   No facility-administered medications prior to visit.    ROS: Review of Systems  Constitutional:  Positive for fatigue. Negative for activity change, appetite change, chills and unexpected weight change.  HENT:  Negative for congestion, mouth sores and sinus pressure.   Eyes:  Negative for visual disturbance.  Respiratory:  Negative for cough and chest tightness.   Gastrointestinal:  Negative for abdominal pain and nausea.  Genitourinary:  Negative for difficulty urinating, frequency and vaginal pain.  Musculoskeletal:  Negative for back pain and gait problem.  Skin:  Negative for pallor and rash.  Neurological:  Negative for dizziness, tremors, weakness, numbness and headaches.  Psychiatric/Behavioral:  Positive for dysphoric mood and sleep disturbance. Negative for confusion, decreased concentration, hallucinations, self-injury and suicidal ideas. The patient is nervous/anxious.     Objective:  BP (!) 116/48   Pulse 72   Temp 98.3 F (36.8 C) (Oral)   Ht 5\' 4"  (1.626 m)   Wt 164 lb (74.4 kg)   SpO2 98%   BMI 28.15 kg/m   BP Readings from Last 3 Encounters:  01/10/24 (!) 116/48  12/24/23  106/78  12/18/23 97/64    Wt Readings from Last 3 Encounters:  01/10/24 164 lb (74.4 kg)  12/24/23 162 lb (73.5 kg)  12/11/23 163 lb (73.9 kg)    Physical Exam Constitutional:      General: She is not in acute distress.    Appearance: She is well-developed.  HENT:     Head: Normocephalic.     Right Ear: External ear normal.     Left Ear: External ear normal.     Nose: Nose normal.  Eyes:     General:        Right eye: No discharge.        Left eye: No discharge.     Conjunctiva/sclera: Conjunctivae normal.     Pupils: Pupils are equal, round, and reactive  to light.  Neck:     Thyroid : No thyromegaly.     Vascular: No JVD.     Trachea: No tracheal deviation.  Cardiovascular:     Rate and Rhythm: Normal rate and regular rhythm.     Heart sounds: Normal heart sounds.  Pulmonary:     Effort: No respiratory distress.     Breath sounds: No stridor. No wheezing.  Abdominal:     General: Bowel sounds are normal. There is no distension.     Palpations: Abdomen is soft. There is no mass.     Tenderness: There is no abdominal tenderness. There is no guarding or rebound.  Musculoskeletal:        General: No tenderness.     Cervical back: Normal range of motion and neck supple. No rigidity.     Right lower leg: No edema.     Left lower leg: No edema.  Lymphadenopathy:     Cervical: No cervical adenopathy.  Skin:    Findings: No erythema or rash.  Neurological:     Mental Status: She is oriented to person, place, and time.     Cranial Nerves: No cranial nerve deficit.     Motor: No abnormal muscle tone.     Coordination: Coordination normal.     Deep Tendon Reflexes: Reflexes normal.  Psychiatric:        Behavior: Behavior normal.        Thought Content: Thought content normal.        Judgment: Judgment normal.    Spasms Tremors - head, face tics Anxious Using a walker    A total time of 45 minutes was spent preparing to see the patient, reviewing tests, x-rays, operative reports and other medical records.  Also, obtaining history and performing comprehensive physical exam.  Additionally, counseling the patient regarding the above listed issues.   Finally, documenting clinical information in the health records, coordination of care, educating the patient re depression, tremors, anxiety. It is a complex case.   Lab Results  Component Value Date   WBC 6.1 12/11/2023   HGB 13.0 12/11/2023   HCT 39.1 12/11/2023   PLT 193 12/11/2023   GLUCOSE 103 (H) 12/11/2023   CHOL 235 (H) 01/31/2022   TRIG 267.0 (H) 01/31/2022   HDL 35.30 (L)  01/31/2022   LDLDIRECT 156.0 01/31/2022   ALT 11 12/11/2023   AST 19 12/11/2023   NA 141 12/11/2023   K 3.7 12/11/2023   CL 111 12/11/2023   CREATININE 0.74 12/11/2023   BUN 16 12/11/2023   CO2 23 12/11/2023   TSH 0.419 12/11/2023    CT Angio Neck W and/or Wo Contrast Result Date: 12/12/2023 CLINICAL DATA:  Initial evaluation for acute neck trauma. EXAM: CT ANGIOGRAPHY NECK TECHNIQUE: Multidetector CT imaging of the neck was performed using the standard protocol during bolus administration of intravenous contrast. Multiplanar CT image reconstructions and MIPs were obtained to evaluate the vascular anatomy. Carotid stenosis measurements (when applicable) are obtained utilizing NASCET criteria, using the distal internal carotid diameter as the denominator. RADIATION DOSE REDUCTION: This exam was performed according to the departmental dose-optimization program which includes automated exposure control, adjustment of the mA and/or kV according to patient size and/or use of iterative reconstruction technique. CONTRAST:  75mL OMNIPAQUE  IOHEXOL  350 MG/ML SOLN COMPARISON:  None Available. FINDINGS: Aortic arch: Visualized aortic arch within normal limits for caliber with standard 3 vessel morphology. Mild aortic atherosclerosis. No stenosis about the origin the great vessels. Right carotid system: No evidence of dissection, stenosis (50% or greater) or occlusion. Left carotid system: No evidence of dissection, stenosis (50% or greater) or occlusion. Vertebral arteries: No evidence of dissection, stenosis (50% or greater) or occlusion. Skeleton: No worrisome osseous lesions. Moderate spondylosis at C3-4 through C6-7. Other neck: No other acute finding. Sequelae of prior thyroidectomy. Upper chest: Hazy subsegmental atelectatic changes noted within the visualized lungs. No other acute finding. IMPRESSION: 1. Negative CTA of the neck. No evidence for acute traumatic vascular injury. 2.  Aortic Atherosclerosis  (ICD10-I70.0). Electronically Signed   By: Virgia Griffins M.D.   On: 12/12/2023 01:11   CT HEAD WO CONTRAST Result Date: 12/12/2023 CLINICAL DATA:  Initial evaluation for acute mental status change, unknown cause. EXAM: CT HEAD WITHOUT CONTRAST TECHNIQUE: Contiguous axial images were obtained from the base of the skull through the vertex without intravenous contrast. RADIATION DOSE REDUCTION: This exam was performed according to the departmental dose-optimization program which includes automated exposure control, adjustment of the mA and/or kV according to patient size and/or use of iterative reconstruction technique. COMPARISON:  None Available. FINDINGS: Brain: Cerebral volume within normal limits for patient age. No acute intracranial hemorrhage. No acute large vessel territory infarct. No mass lesion, midline shift, or mass effect. Ventricles are normal in size without hydrocephalus. No extra-axial fluid collection. Vascular: No abnormal hyperdense vessel. Skull: Scalp soft tissues demonstrate no acute abnormality. Calvarium intact. Sinuses/Orbits: Globes and orbital soft tissues within normal limits. Visualized paranasal sinuses are largely clear. No significant mastoid effusion. IMPRESSION: Normal head CT. No acute intracranial abnormality. Electronically Signed   By: Virgia Griffins M.D.   On: 12/12/2023 01:02    Assessment & Plan:   Problem List Items Addressed This Visit     Hypothyroidism   Cont w/ Levothroid      Anxiety disorder   Worse Restart Xanax  1 mg bid - dtr will dispense 2 tab a day      Relevant Medications   ALPRAZolam  (XANAX ) 1 MG tablet   Other Relevant Orders   Ambulatory referral to Psychiatry   Suicide attempt by benzodiazepine overdose (HCC)   Worsening tremor, tics and anxiety off Xanax  Restart Xanax  1 mg bid - dtr will dispense 2 tab a day      Major depressive disorder, recurrent, moderate (HCC) - Primary   Suicidal attempt 12/2023 Will ref to  [facility] on Pristiq  50 mg/d      Relevant Medications   ALPRAZolam  (XANAX ) 1 MG tablet   Benign head tremor   Worsening tremor, tics and anxiety off Xanax  Restart Xanax  1 mg bid - dtr will dispense 2 tab a day      Other Visit Diagnoses  Severe episode of recurrent major depressive disorder, without psychotic features (HCC)       Relevant Medications   ALPRAZolam  (XANAX ) 1 MG tablet   Other Relevant Orders   Ambulatory referral to Psychiatry         Meds ordered this encounter  Medications   ALPRAZolam  (XANAX ) 1 MG tablet    Sig: Take 1 tablet (1 mg total) by mouth 2 (two) times daily.    Dispense:  60 tablet    Refill:  1      Follow-up: Return in about 2 months (around 03/11/2024) for a follow-up visit.  Anitra Barn, MD

## 2024-01-10 NOTE — Assessment & Plan Note (Signed)
Cont w/ Levothroid 

## 2024-01-10 NOTE — Assessment & Plan Note (Addendum)
 Worse Restart Xanax  1 mg bid - dtr will dispense 2 tab a day

## 2024-01-11 ENCOUNTER — Ambulatory Visit: Payer: Self-pay | Admitting: Internal Medicine

## 2024-01-11 NOTE — Telephone Encounter (Signed)
 Okay. Thank you.

## 2024-01-14 ENCOUNTER — Encounter: Payer: Self-pay | Admitting: Internal Medicine

## 2024-01-14 ENCOUNTER — Ambulatory Visit: Admitting: Internal Medicine

## 2024-01-14 DIAGNOSIS — F0390 Unspecified dementia without behavioral disturbance: Secondary | ICD-10-CM | POA: Diagnosis not present

## 2024-01-14 DIAGNOSIS — D649 Anemia, unspecified: Secondary | ICD-10-CM | POA: Diagnosis not present

## 2024-01-14 DIAGNOSIS — F331 Major depressive disorder, recurrent, moderate: Secondary | ICD-10-CM | POA: Diagnosis not present

## 2024-01-14 DIAGNOSIS — N3001 Acute cystitis with hematuria: Secondary | ICD-10-CM | POA: Diagnosis not present

## 2024-01-14 DIAGNOSIS — T424X2D Poisoning by benzodiazepines, intentional self-harm, subsequent encounter: Secondary | ICD-10-CM | POA: Diagnosis not present

## 2024-01-14 DIAGNOSIS — E039 Hypothyroidism, unspecified: Secondary | ICD-10-CM

## 2024-01-14 DIAGNOSIS — T1491XD Suicide attempt, subsequent encounter: Secondary | ICD-10-CM | POA: Diagnosis not present

## 2024-01-14 NOTE — Telephone Encounter (Signed)
 LDVM for [place] with verbal OK per provider for OT 1 x a week for 4 weeks.

## 2024-01-16 ENCOUNTER — Other Ambulatory Visit: Payer: Self-pay | Admitting: Internal Medicine

## 2024-01-16 ENCOUNTER — Other Ambulatory Visit (INDEPENDENT_AMBULATORY_CARE_PROVIDER_SITE_OTHER)

## 2024-01-16 DIAGNOSIS — E039 Hypothyroidism, unspecified: Secondary | ICD-10-CM | POA: Diagnosis not present

## 2024-01-16 LAB — T4, FREE: Free T4: 1.45 ng/dL (ref 0.60–1.60)

## 2024-01-16 LAB — T3, FREE: T3, Free: 3.2 pg/mL (ref 2.3–4.2)

## 2024-01-17 ENCOUNTER — Encounter: Payer: Self-pay | Admitting: Internal Medicine

## 2024-01-20 ENCOUNTER — Ambulatory Visit: Payer: Self-pay | Admitting: Internal Medicine

## 2024-01-22 ENCOUNTER — Ambulatory Visit: Admitting: Internal Medicine

## 2024-02-01 ENCOUNTER — Telehealth: Payer: Self-pay | Admitting: Internal Medicine

## 2024-02-01 NOTE — Telephone Encounter (Signed)
 Copied from CRM [id number]. Topic: General - Other >> Feb 01, 2024  9:16 AM [place] B wrote: Reason for CRM: Linda Hayes therapy at [facility] , states pt missed PT since Monday. Patient stated she didn't want  home health visits, then pt stated she would do the therapy another time

## 2024-02-04 ENCOUNTER — Ambulatory Visit

## 2024-02-04 VITALS — Ht 64.0 in | Wt 164.0 lb

## 2024-02-04 DIAGNOSIS — Z0000 Encounter for general adult medical examination without abnormal findings: Secondary | ICD-10-CM | POA: Diagnosis not present

## 2024-02-04 DIAGNOSIS — F332 Major depressive disorder, recurrent severe without psychotic features: Secondary | ICD-10-CM | POA: Diagnosis not present

## 2024-02-04 DIAGNOSIS — F411 Generalized anxiety disorder: Secondary | ICD-10-CM | POA: Diagnosis not present

## 2024-02-04 NOTE — Patient Instructions (Signed)
 Linda Hayes , Thank you for taking time out of your busy schedule to complete your Annual Wellness Visit with me. I enjoyed our conversation and look forward to speaking with you again next year. I, as well as your care team,  appreciate your ongoing commitment to your health goals. Please review the following plan we discussed and let me know if I can assist you in the future. Your Game plan/ To Do List    Follow up Visits: Next Medicare AWV with our clinical staff: 02/05/2025.   Have you seen your provider in the last 6 months (3 months if uncontrolled diabetes)? Yes Next Office Visit with your provider: Patient stated that she will have her daughter call the office to schedule a follow up visit with provider.  Her last office visit was on 01/10/2024.  Clinician Recommendations:  Aim for 30 minutes of exercise or brisk walking, 6-8 glasses of water, and 5 servings of fruits and vegetables each day.       This is a list of the screening recommended for you and due dates:  Health Maintenance  Topic Date Due   Hepatitis C Screening  Never done   DTaP/Tdap/Td vaccine (1 - Tdap) Never done   DEXA scan (bone density measurement)  Never done   Mammogram  02/01/2018   Pneumococcal Vaccine for age over 109 (2 of 2 - PCV) 01/23/2019   COVID-19 Vaccine (3 - Pfizer risk series) 12/08/2019   Flu Shot  03/14/2024   Medicare Annual Wellness Visit  02/03/2025   Colon Cancer Screening  03/22/2030   HPV Vaccine  Aged Out   Meningitis B Vaccine  Aged Out   Cologuard (Stool DNA test)  Discontinued   Zoster (Shingles) Vaccine  Discontinued    Advanced directives: (Copy Requested) Please bring a copy of your health care power of attorney and living will to the office to be added to your chart at your convenience. You can mail to [facility] [address] [place], [place] [address] or email to [email or web address] Advance Care Planning is important because it:  [x]  Makes sure you  receive the medical care that is consistent with your values, goals, and preferences  [x]  It provides guidance to your family and loved ones and reduces their decisional burden about whether or not they are making the right decisions based on your wishes.  Follow the link provided in your after visit summary or read over the paperwork we have mailed to you to help you started getting your Advance Directives in place. If you need assistance in completing these, please reach out to us  so that we can help you!  See attachments for Preventive Care and Fall Prevention Tips.

## 2024-02-04 NOTE — Progress Notes (Addendum)
 Subjective:   Linda Hayes is a 73 y.o. who presents for a Medicare Wellness preventive visit.  As a reminder, Annual Wellness Visits don't include a physical exam, and some assessments may be limited, especially if this visit is performed virtually. We may recommend an in-person follow-up visit with your provider if needed.  Visit Complete: Virtual I connected with  Rock JAYSON Passy on 02/04/24 by a audio enabled telemedicine application and verified that I am speaking with the correct person using two identifiers.  Patient Location: Home  Provider Location: Home Office  I discussed the limitations of evaluation and management by telemedicine. The patient expressed understanding and agreed to proceed.  Vital Signs: Because this visit was a virtual/telehealth visit, some criteria may be missing or patient reported. Any vitals not documented were not able to be obtained and vitals that have been documented are patient reported.  VideoDeclined- This patient declined Librarian, academic. Therefore the visit was completed with audio only.  Persons Participating in Visit: Patient.  AWV Questionnaire: No: Patient Medicare AWV questionnaire was not completed prior to this visit.  Cardiac Risk Factors include: advanced age (>74men, >51 women);dyslipidemia     Objective:    Today's Vitals   02/04/24 1534  Weight: 164 lb (74.4 kg)  Height: 5' 4 (1.626 m)   Body mass index is 28.15 kg/m.     02/04/2024    4:11 PM 12/11/2023   11:43 PM 05/02/2022    3:10 PM 10/19/2016    8:17 AM 05/17/2015   11:08 AM  Advanced Directives  Does Patient Have a Medical Advance Directive? Yes No No Yes;No  No   Type of Estate agent of [place];Living will      Does patient want to make changes to medical advance directive?    Yes (ED - Information included in AVS)    Copy of Healthcare Power of Attorney in Chart? No - copy requested      Would patient like  information on creating a medical advance directive?   No - Patient declined  Yes - Educational materials given      Data saved with a previous flowsheet row definition    Current Medications (verified) Outpatient Encounter Medications as of 02/04/2024  Medication Sig   ADVIL  200 MG CAPS Take 200-600 mg by mouth every 6 (six) hours as needed (for pain or headaches).   ALPRAZolam  (XANAX ) 1 MG tablet Take 1 tablet (1 mg total) by mouth 2 (two) times daily.   Carboxymeth-Glycerin-Polysorb (REFRESH OPTIVE ADVANCED PF OP) Place 1 drop into both eyes 3 (three) times daily as needed (FOR DRYNESS).   Cholecalciferol (VITAMIN D3) 50 MCG (2000 UT) capsule Take 1 capsule (2,000 Units total) by mouth daily.   desvenlafaxine  (PRISTIQ ) 50 MG 24 hr tablet Take 1 tablet (50 mg total) by mouth daily.   EPINEPHrine  (EPIPEN  2-PAK) 0.3 mg/0.3 mL IJ SOAJ injection Inject 0.3 mg into the muscle as needed for anaphylaxis.   levothyroxine  (SYNTHROID ) 125 MCG tablet Take 1 tablet (125 mcg total) by mouth daily at 6 (six) AM.   Multiple Vitamins-Minerals (CENTRUM SILVER  ADULT 50+) TABS 1 po qd   OVER THE COUNTER MEDICATION Take 1-2 tablets by mouth See admin instructions. Advil  Dual Action tablets/caplets: Take 1-2 caplets by mouth every 8 hours as needed for headaches or pain   OVER THE COUNTER MEDICATION Take 1 capsule by mouth See admin instructions. AZO Dual Protection Urinary + Vaginal Support Capsules: Take 1  capsule by mouth once a day (prebiotic + probiotic)   No facility-administered encounter medications on file as of 02/04/2024.    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    Cancer (HCC)    thyroid  cancer    Cataract    forming both eyes    Colitis due to Clostridioides difficile 2007   DEPRESSION 03/17/2008   GAIT ATAXIA 06/11/2009   GERD (gastroesophageal reflux disease)    diet related    Hyperlipidemia    HYPOTHYROIDISM 03/17/2008   INSOMNIA, PERSISTENT 03/17/2008   Meige syndrome (blepharospasm with oromandibular dystonia)    OBESITY 03/17/2008   Palpitations 10/01/2008   SWEATING 03/17/2008   Past Surgical History:  Procedure Laterality Date   COLONOSCOPY     THYROIDECTOMY  1999   TONSILLECTOMY     Family History  Problem Relation Age of Onset   Other Mother        injury related to a fall   Prostate cancer Father    Colon polyps Sister    Hypertension Other    Depression Other    Diabetes Other    Colon cancer Neg Hx    Esophageal cancer Neg Hx    Stomach cancer Neg Hx    Liver disease Neg Hx    Pancreatic cancer Neg Hx    Ulcerative colitis Neg Hx    Social History   Socioeconomic History   Marital status: Widowed    Spouse name: Not on file   Number of children: 3   Years of education: 69   Highest education level: 12th grade  Occupational History   Occupation: Disabled   Occupation: RETIRED  Tobacco Use   Smoking status: Never   Smokeless tobacco: Never  Vaping Use   Vaping status: Never Used  Substance and Sexual Activity   Alcohol use: Not Currently    Alcohol/week: 0.0 standard drinks of alcohol   Drug use: No   Sexual activity: Yes  Other Topics Concern   Not on file  Social History Narrative   Married, 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   Right-handed.    No daily use of caffeine.      Social Drivers of Health   Financial Resource Strain: Medium Risk (02/04/2024)   Overall Financial Resource Strain (CARDIA)    Difficulty of Paying Living Expenses: Somewhat hard  Food Insecurity: No Food Insecurity (02/04/2024)   Hunger Vital Sign    Worried About Running Out of Food in  the Last Year: Never true    Ran Out of Food in the Last Year: Never true  Transportation Needs: No Transportation Needs (02/04/2024)   PRAPARE - Administrator, Civil Service (Medical): No    Lack of Transportation (Non-Medical): No  Physical Activity: Insufficiently Active (02/04/2024)   Exercise Vital Sign    Days of Exercise per Week: 3 days    Minutes of Exercise per Session: 30 min  Stress: Stress Concern Present (02/04/2024)   [employer] of Occupational Health - Occupational Stress Questionnaire    Feeling of Stress: Very much  Social Connections: Socially Isolated (02/04/2024)   Social Connection and Isolation Panel    Frequency of Communication with Friends and Family: More than three times a week  Frequency of Social Gatherings with Friends and Family: Twice a week    Attends Religious Services: Never    Database administrator or Organizations: No    Attends Banker Meetings: Never    Marital Status: Widowed    Tobacco Counseling Counseling given: Not Answered    Clinical Intake:  Pre-visit preparation completed: Yes  Pain : No/denies pain     BMI - recorded: 28.15 Nutritional Status: BMI 25 -29 Overweight Nutritional Risks: None Diabetes: No  No results found for: HGBA1C   How often do you need to have someone help you when you read instructions, pamphlets, or other written materials from your doctor or pharmacy?: 1 - Never  Interpreter Needed?: No  Information entered by :: Haze Antillon, RMA   Activities of Daily Living     02/04/2024    3:35 PM  In your present state of health, do you have any difficulty performing the following activities:  Hearing? 0  Vision? 0  Difficulty concentrating or making decisions? 0  Walking or climbing stairs? 0  Dressing or bathing? 0  Doing errands, shopping? 0  Comment family drives her around  Preparing Food and eating ? N  Using the Toilet? N  In the past six months, have you  accidently leaked urine? N  Do you have problems with loss of bowel control? N  Managing your Medications? N  Managing your Finances? N  Housekeeping or managing your Housekeeping? N    Patient Care Team: Plotnikov, Karlynn GAILS, MD as PCP - General Avram Lupita BRAVO, MD as Attending Physician (Gastroenterology) Blinda Ferry, MD as Consulting Physician (Psychiatry) Mevelyn Clarity (Inactive) as Referring Physician Onita Duos, MD as Consulting Physician (Neurology) Patrcia Sharper, MD as Consulting Physician (Ophthalmology) Onita Duos, MD as Consulting Physician (Neurology)  I have updated your Care Teams any recent Medical Services you may have received from other providers in the past year.     Assessment:   This is a routine wellness examination for [place].  Hearing/Vision screen Hearing Screening - Comments:: Denies hearing difficulties   Vision Screening - Comments:: Denies vision issues./ Dr. Patrcia    Goals Addressed             This Visit's Progress    Continue to do the best that I can.   On track      Depression Screen     02/04/2024    4:17 PM 12/03/2023    3:17 PM 07/10/2023   10:14 AM 12/21/2022    9:50 AM 05/02/2022    3:38 PM 04/03/2022    2:02 PM 01/27/2022    9:12 AM  PHQ 2/9 Scores  PHQ - 2 Score 2 6 0 0 2 2 6   PHQ- 9 Score 6 14   3 3 17     Fall Risk     02/04/2024    4:12 PM 12/03/2023    2:42 PM 07/10/2023   10:14 AM 12/21/2022    9:50 AM 05/02/2022    3:41 PM  Fall Risk   Falls in the past year? 1 1 0 1 1  Number falls in past yr: 1 1 0 0 1  Injury with Fall? 0 0 0 0 1  Risk for fall due to :  Impaired balance/gait;Impaired vision No Fall Risks Impaired balance/gait History of fall(s);Impaired balance/gait;Orthopedic patient  Follow up Falls evaluation completed;Falls prevention discussed Falls evaluation completed Falls evaluation completed Falls evaluation completed Education provided;Falls prevention discussed  Data saved with a previous  flowsheet row definition    MEDICARE RISK AT HOME:  Medicare Risk at Home Any stairs in or around the home?: Yes If so, are there any without handrails?: No Home free of loose throw rugs in walkways, pet beds, electrical cords, etc?: Yes Adequate lighting in your home to reduce risk of falls?: Yes Life alert?: No Use of a cane, walker or w/c?: Yes (cane and walker) Grab bars in the bathroom?: Yes Shower chair or bench in shower?: No Elevated toilet seat or a handicapped toilet?: No  TIMED UP AND GO:  Was the test performed?  No  Cognitive Function: 6CIT completed        02/04/2024    4:13 PM 05/02/2022    3:42 PM  6CIT Screen  What Year? 0 points 0 points  What month? 0 points 0 points  What time? 0 points 0 points  Count back from 20 0 points 0 points  Months in reverse 0 points 0 points  Repeat phrase 0 points 0 points  Total Score 0 points 0 points    Immunizations Immunization History  Administered Date(s) Administered   Fluad Quad(high Dose 65+) 07/19/2020   Fluad Trivalent(High Dose 65+) 07/10/2023   Influenza Whole 04/28/2010   Influenza, High Dose Seasonal PF 05/23/2017, 05/18/2019, 07/21/2021   Influenza,inj,Quad PF,6+ Mos 06/12/2015, 05/23/2016, 06/03/2018   Influenza-Unspecified 05/14/2012, 05/14/2013, 04/12/2023   PFIZER(Purple Top)SARS-COV-2 Vaccination 10/09/2019, 11/10/2019   Pneumococcal Polysaccharide-23 01/22/2018   Zoster, Live 03/31/2014    Screening Tests Health Maintenance  Topic Date Due   Hepatitis C Screening  Never done   DTaP/Tdap/Td (1 - Tdap) Never done   DEXA SCAN  Never done   MAMMOGRAM  02/01/2018   Pneumococcal Vaccine: 50+ Years (2 of 2 - PCV) 01/23/2019   COVID-19 Vaccine (3 - Pfizer risk series) 12/08/2019   INFLUENZA VACCINE  03/14/2024   Medicare Annual Wellness (AWV)  02/03/2025   Colonoscopy  03/22/2030   HPV VACCINES  Aged Out   Meningococcal B Vaccine  Aged Out   Fecal DNA (Cologuard)  Discontinued   Zoster  Vaccines- Shingrix  Discontinued    Health Maintenance  Health Maintenance Due  Topic Date Due   Hepatitis C Screening  Never done   DTaP/Tdap/Td (1 - Tdap) Never done   DEXA SCAN  Never done   MAMMOGRAM  02/01/2018   Pneumococcal Vaccine: 50+ Years (2 of 2 - PCV) 01/23/2019   COVID-19 Vaccine (3 - Pfizer risk series) 12/08/2019   Health Maintenance Items Addressed: See Nurse Notes at the end of this note  Additional Screening:  Vision Screening: Recommended annual ophthalmology exams for early detection of glaucoma and other disorders of the eye. Would you like a referral to an eye doctor? No    Dental Screening: Recommended annual dental exams for proper oral hygiene  Community Resource Referral / Chronic Care Management: CRR required this visit?  No   CCM required this visit?  No   Plan:    I have personally reviewed and noted the following in the patient's chart:   Medical and social history Use of alcohol, tobacco or illicit drugs  Current medications and supplements including opioid prescriptions. Patient is not currently taking opioid prescriptions. Functional ability and status Nutritional status Physical activity Advanced directives List of other physicians Hospitalizations, surgeries, and ER visits in previous 12 months Vitals Screenings to include cognitive, depression, and falls Referrals and appointments  In addition, I have reviewed and discussed  with patient certain preventive protocols, quality metrics, and best practice recommendations. A written personalized care plan for preventive services as well as general preventive health recommendations were provided to patient.   Kimora Stankovic L Toribio Seiber, CMA   02/04/2024   After Visit Summary: (MyChart) Due to this being a telephonic visit, the after visit summary with patients personalized plan was offered to patient via MyChart   Notes: Patient declines a mammogram and a DEXA.  Patient is due for a pneumonia  vaccine and a Tdap however she declines them.  She also declines a Hep C screening.  She stated that she will have her daughter call the office to schedule her follow up visit with Dr. Garald.

## 2024-02-06 ENCOUNTER — Telehealth: Payer: Self-pay

## 2024-02-06 NOTE — Telephone Encounter (Signed)
 Copied from CRM [id number]. Topic: General - Other >> Feb 06, 2024  3:11 PM Carlatta H wrote: Reason for CRM: Needs orders from last month signed//Will refax

## 2024-02-06 NOTE — Telephone Encounter (Signed)
 Noted. Thx.

## 2024-02-07 NOTE — Telephone Encounter (Signed)
 Okay.  Thanks.

## 2024-02-18 NOTE — Telephone Encounter (Unsigned)
 Copied from CRM [id number]. Topic: Clinical - Medical Advice >> Feb 18, 2024  9:24 AM Merlynn LABOR wrote: Reason for CRM: Grayce from [facility] called to check status of order sent over on 02/08/24. Please contact to provide update on order, she can be reached at [phone].

## 2024-02-27 NOTE — Telephone Encounter (Signed)
 Copied from CRM [id number]. Topic: General - Other >> Feb 27, 2024 11:04 AM Deleta RAMAN wrote: Reason for CRM: Marven [facility] is calling in regards to order that was sent in may of 2025 that was not signed by Merlynn, B.   You can reach Dungan S.  At [phone]

## 2024-02-27 NOTE — Telephone Encounter (Signed)
 Copied from CRM [id number]. Topic: General - Other >> Feb 27, 2024 10:58 AM Willma R wrote: Reason for CRM: Sonya from [facility] called to check status of order sent over on 02/08/24. CRM previously sent to the incorrect office.   Please contact to provide update on order, she can be reached at [phone].

## 2024-03-03 DIAGNOSIS — F331 Major depressive disorder, recurrent, moderate: Secondary | ICD-10-CM | POA: Diagnosis not present

## 2024-03-03 DIAGNOSIS — F411 Generalized anxiety disorder: Secondary | ICD-10-CM | POA: Diagnosis not present

## 2024-03-06 ENCOUNTER — Other Ambulatory Visit: Payer: Self-pay | Admitting: Internal Medicine

## 2024-03-07 ENCOUNTER — Encounter: Payer: Self-pay | Admitting: Internal Medicine

## 2024-03-15 ENCOUNTER — Encounter (HOSPITAL_BASED_OUTPATIENT_CLINIC_OR_DEPARTMENT_OTHER): Payer: Self-pay | Admitting: *Deleted

## 2024-03-15 ENCOUNTER — Emergency Department (HOSPITAL_BASED_OUTPATIENT_CLINIC_OR_DEPARTMENT_OTHER)
Admission: EM | Admit: 2024-03-15 | Discharge: 2024-03-15 | Disposition: A | Discharge status: Home / Self Care | Attending: Emergency Medicine | Admitting: Emergency Medicine

## 2024-03-15 DIAGNOSIS — N390 Urinary tract infection, site not specified: Secondary | ICD-10-CM | POA: Insufficient documentation

## 2024-03-15 DIAGNOSIS — R300 Dysuria: Secondary | ICD-10-CM | POA: Diagnosis present

## 2024-03-15 LAB — CBC WITH DIFFERENTIAL/PLATELET
Abs Immature Granulocytes: 0.03 K/uL (ref 0.00–0.07)
Basophils Absolute: 0.1 K/uL (ref 0.0–0.1)
Basophils Relative: 1 %
Eosinophils Absolute: 0.2 K/uL (ref 0.0–0.5)
Eosinophils Relative: 2 %
HCT: 38.9 % (ref 36.0–46.0)
Hemoglobin: 13.5 g/dL (ref 12.0–15.0)
Immature Granulocytes: 0 %
Lymphocytes Relative: 39 %
Lymphs Abs: 3 K/uL (ref 0.7–4.0)
MCH: 32.8 pg (ref 26.0–34.0)
MCHC: 34.7 g/dL (ref 30.0–36.0)
MCV: 94.6 fL (ref 80.0–100.0)
Monocytes Absolute: 0.6 K/uL (ref 0.1–1.0)
Monocytes Relative: 7 %
Neutro Abs: 3.8 K/uL (ref 1.7–7.7)
Neutrophils Relative %: 51 %
Platelets: 210 K/uL (ref 150–400)
RBC: 4.11 MIL/uL (ref 3.87–5.11)
RDW: 13.2 % (ref 11.5–15.5)
WBC: 7.7 K/uL (ref 4.0–10.5)
nRBC: 0 % (ref 0.0–0.2)

## 2024-03-15 LAB — BASIC METABOLIC PANEL WITH GFR
Anion gap: 10 (ref 5–15)
BUN: 21 mg/dL (ref 8–23)
CO2: 25 mmol/L (ref 22–32)
Calcium: 8.8 mg/dL — ABNORMAL LOW (ref 8.9–10.3)
Chloride: 104 mmol/L (ref 98–111)
Creatinine, Ser: 0.91 mg/dL (ref 0.44–1.00)
GFR, Estimated: >60 mL/min (ref >60)
Glucose, Bld: 100 mg/dL — ABNORMAL HIGH (ref 70–99)
Potassium: 4.2 mmol/L (ref 3.5–5.1)
Sodium: 139 mmol/L (ref 135–145)

## 2024-03-15 LAB — URINALYSIS, W/ REFLEX TO CULTURE (INFECTION SUSPECTED)
Bilirubin Urine: NEGATIVE
Glucose, UA: NEGATIVE mg/dL
Ketones, ur: NEGATIVE mg/dL
Nitrite: NEGATIVE
Protein, ur: NEGATIVE mg/dL
Specific Gravity, Urine: 1.007 (ref 1.005–1.030)
WBC, UA: >50 WBC/hpf (ref 0–5)
pH: 6 (ref 5.0–8.0)

## 2024-03-15 MED ORDER — CEPHALEXIN 500 MG PO CAPS: 500.0000 mg | ORAL_CAPSULE | Freq: Four times a day (QID) | ORAL | 0 refills | Status: AC

## 2024-03-15 NOTE — ED Provider Notes (Signed)
 [facility] EMERGENCY DEPARTMENT AT [facility] Provider Note   CSN: [id number] Arrival date & time: 03/15/24  1157     Patient presents with: Dysuria   Linda Hayes is a 73 y.o. female.   73 year old female presents with 1 week history of UTI symptoms.  She notes increasing frequency as well as urgency.  Denies any fever or chills.  No nausea or vomiting.  No back pain appreciated.  Patient required admission for her UTI several months ago.  No treatment used for her current symptoms prior to arrival       Prior to Admission medications   Medication Sig Start Date End Date Taking? Authorizing Provider  ADVIL  200 MG CAPS Take 200-600 mg by mouth every 6 (six) hours as needed (for pain or headaches).    [provider]  ALPRAZolam  (XANAX ) 1 MG tablet TAKE 1 TABLET BY MOUTH TWICE A DAY 03/08/24   Hayes, Linda V, MD  Carboxymeth-Glycerin-Polysorb (REFRESH OPTIVE ADVANCED PF OP) Place 1 drop into both eyes 3 (three) times daily as needed (FOR DRYNESS).    [provider]  Cholecalciferol (VITAMIN D3) 50 MCG (2000 UT) capsule Take 1 capsule (2,000 Units total) by mouth daily. 07/19/20   Hayes, Linda V, MD  desvenlafaxine  (PRISTIQ ) 50 MG 24 hr tablet Take 1 tablet (50 mg total) by mouth daily. 12/24/23   Geofm Glade PARAS, MD  EPINEPHrine  (EPIPEN  2-PAK) 0.3 mg/0.3 mL IJ SOAJ injection Inject 0.3 mg into the muscle as needed for anaphylaxis. 12/03/23   Linda Niki FALCON, FNP  levothyroxine  (SYNTHROID ) 125 MCG tablet Take 1 tablet (125 mcg total) by mouth daily at 6 (six) AM. 12/19/23 02/04/24  Hayes, Linda C, NP  Multiple Vitamins-Minerals (CENTRUM SILVER  ADULT 50+) TABS 1 po qd 12/21/22   Hayes, Linda V, MD  OVER THE COUNTER MEDICATION Take 1-2 tablets by mouth See admin instructions. Advil  Dual Action tablets/caplets: Take 1-2 caplets by mouth every 8 hours as needed for headaches or pain    [provider]  OVER THE COUNTER MEDICATION Take 1  capsule by mouth See admin instructions. AZO Dual Protection Urinary + Vaginal Support Capsules: Take 1 capsule by mouth once a day (prebiotic + probiotic)    [provider]    Allergies: Ciprofloxacin, Macrobid  [nitrofurantoin ], Ambien  [zolpidem ], Cefdinir , Erythromycin, Gabapentin , Hydrocodone , Hydroxyzine , Tramadol , Trazodone  and nefazodone, Amoxicillin, Penicillins, and Ritalin  [methylphenidate]    Review of Systems  All other systems reviewed and are negative.   Updated Vital Signs BP 127/86 (BP Location: Right Arm)   Pulse 88   Temp (!) 97.5 F (36.4 C) (Oral)   Resp 18   SpO2 97%   Physical Exam Vitals and nursing note reviewed.  Constitutional:      General: She is not in acute distress.    Appearance: Normal appearance. She is well-developed. She is not toxic-appearing.  HENT:     Head: Normocephalic and atraumatic.  Eyes:     General: Lids are normal.     Conjunctiva/sclera: Conjunctivae normal.     Pupils: Pupils are equal, round, and reactive to light.  Neck:     Thyroid : No thyroid  mass.     Trachea: No tracheal deviation.  Cardiovascular:     Rate and Rhythm: Normal rate and regular rhythm.     Heart sounds: Normal heart sounds. No murmur heard.    No gallop.  Pulmonary:     Effort: Pulmonary effort is normal. No respiratory distress.  Breath sounds: Normal breath sounds. No stridor. No decreased breath sounds, wheezing, rhonchi or rales.  Abdominal:     General: There is no distension.     Palpations: Abdomen is soft.     Tenderness: There is no abdominal tenderness. There is no rebound.  Musculoskeletal:        General: No tenderness. Normal range of motion.     Cervical back: Normal range of motion and neck supple.  Skin:    General: Skin is warm and dry.     Findings: No abrasion or rash.  Neurological:     Mental Status: She is alert and oriented to person, place, and time. Mental status is at baseline.     GCS: GCS eye subscore is  4. GCS verbal subscore is 5. GCS motor subscore is 6.     Cranial Nerves: No cranial nerve deficit.     Sensory: No sensory deficit.     Motor: Motor function is intact.  Psychiatric:        Attention and Perception: Attention normal.        Speech: Speech normal.        Behavior: Behavior normal.     (all labs ordered are listed, but only abnormal results are displayed) Labs Reviewed  URINALYSIS, W/ REFLEX TO CULTURE (INFECTION SUSPECTED)  CBC WITH DIFFERENTIAL/PLATELET  BASIC METABOLIC PANEL WITH GFR    EKG: None  Radiology: No results found.   Procedures   Medications Ordered in the ED - No data to display                                  Medical Decision Making Amount and/or Complexity of Data Reviewed Labs: ordered.   Blood here is reassuring.  No leukocytosis.  Low suspicion for systemic infectious process.  Urinalysis consistent with infection.  Patient states that the only antibiotic that seems to work for her is cephalexin .  Will give prescription for this.  Patient will follow-up with her doctor     Final diagnoses:  None    ED Discharge Orders     None          Linda Faden, MD 03/15/24 1331

## 2024-03-15 NOTE — ED Triage Notes (Addendum)
 Pt to ED reporting dysuria x 2 days with lower abd 7/10 pain constantly. Patient concerned for recurrent UTI with recent admission for UTI in April.

## 2024-03-17 LAB — URINE CULTURE: Culture: 60000 — AB

## 2024-03-18 ENCOUNTER — Telehealth (HOSPITAL_BASED_OUTPATIENT_CLINIC_OR_DEPARTMENT_OTHER): Payer: Self-pay | Admitting: *Deleted

## 2024-03-18 NOTE — Telephone Encounter (Signed)
 Post ED Visit - Positive Culture Follow-up  Culture report reviewed by antimicrobial stewardship pharmacist: [facility] []  Rankin Dee, Pharm.D. [x]  Venetia Gully, Pharm.D., BCPS AQ-ID []  Garrel Crews, Pharm.D., BCPS []  Almarie Lunger, [address].D., BCPS []  [place], [address].D., BCPS, AAHIVP []  Rosaline Bihari, Pharm.D., BCPS, AAHIVP []  Vernell Meier, PharmD, BCPS []  Latanya Hint, PharmD, BCPS []  Donald Medley, PharmD, BCPS []  Rocky Bold, PharmD []  Dorothyann Alert, PharmD, BCPS []  Morene Babe, PharmD  [facility] []  Rosaline Edison, PharmD []  Romona Bliss, PharmD []  Dolphus Roller, PharmD []  Veva Seip, Rph []  Vernell Daunt) Leonce, PharmD []  Eva Allis, PharmD []  Rosaline Millet, PharmD []  Iantha Batch, PharmD []  Arvin Gauss, PharmD []  Wanda Hasting, PharmD []  Ronal Rav, PharmD []  Rocky Slade, PharmD []  Bard Jeans, PharmD   Positive urine culture Treated with cephalexin , organism sensitive to the same and no further patient follow-up is required at this time.  Lorita Barnie Pereyra 03/18/2024, 10:38 AM

## 2024-03-26 ENCOUNTER — Encounter: Payer: Self-pay | Admitting: Internal Medicine

## 2024-03-30 ENCOUNTER — Other Ambulatory Visit: Payer: Self-pay | Admitting: Internal Medicine

## 2024-03-30 DIAGNOSIS — G245 Blepharospasm: Secondary | ICD-10-CM

## 2024-03-31 DIAGNOSIS — F411 Generalized anxiety disorder: Secondary | ICD-10-CM | POA: Diagnosis not present

## 2024-03-31 DIAGNOSIS — F331 Major depressive disorder, recurrent, moderate: Secondary | ICD-10-CM | POA: Diagnosis not present

## 2024-04-05 ENCOUNTER — Other Ambulatory Visit: Payer: Self-pay | Admitting: Internal Medicine

## 2024-04-30 ENCOUNTER — Other Ambulatory Visit: Payer: Self-pay | Admitting: Internal Medicine

## 2024-05-05 ENCOUNTER — Other Ambulatory Visit: Payer: Self-pay | Admitting: Internal Medicine

## 2024-05-05 NOTE — Telephone Encounter (Signed)
 Medication: Xanax  Directions: TAKE 1 TABLET BY MOUTH TWICE A DAY,  Last given: 03/08/2024 Number refills: 1 Last o/v: 12/24/2023 Follow up:  Labs:

## 2024-05-07 NOTE — Telephone Encounter (Signed)
 Patient called to follow up on refill of Alprazolam  (XANAX ). Patient requests that the office calls the patient to confirm the medication has been sent to the pharmacy.

## 2024-05-19 DIAGNOSIS — F330 Major depressive disorder, recurrent, mild: Secondary | ICD-10-CM | POA: Diagnosis not present

## 2024-05-19 DIAGNOSIS — F411 Generalized anxiety disorder: Secondary | ICD-10-CM | POA: Diagnosis not present

## 2024-06-02 ENCOUNTER — Telehealth: Payer: Self-pay | Admitting: Neurology

## 2024-06-02 NOTE — Telephone Encounter (Signed)
 Patient cancelled appointment due to seeing neurologist at [facility].

## 2024-06-17 ENCOUNTER — Institutional Professional Consult (permissible substitution): Admitting: Neurology

## 2024-06-23 ENCOUNTER — Telehealth: Payer: Self-pay

## 2024-06-23 NOTE — Telephone Encounter (Signed)
 Copied from CRM #[id number]. Topic: Clinical - Prescription Issue >> Jun 23, 2024  9:51 AM Janeecia G wrote: Reason for CRM: [facility] for [facility]ill for [facility]roid  and they havent gotten a response yet. Please send in prescription.

## 2024-06-24 ENCOUNTER — Other Ambulatory Visit: Payer: Self-pay | Admitting: Internal Medicine

## 2024-06-24 ENCOUNTER — Other Ambulatory Visit: Payer: Self-pay

## 2024-06-24 DIAGNOSIS — E890 Postprocedural hypothyroidism: Secondary | ICD-10-CM

## 2024-06-24 MED ORDER — LEVOTHYROXINE SODIUM 125 MCG PO TABS: 125.0000 ug | ORAL_TABLET | Freq: Every day | ORAL | 0 refills | Status: DC

## 2024-06-24 NOTE — Telephone Encounter (Unsigned)
 Copied from CRM [id number]. Topic: Clinical - Medication Refill >> Jun 24, 2024  8:54 AM Suzen RAMAN wrote: Medication: levothyroxine  (SYNTHROID ) 125 MCG tablet  Has the patient contacted their pharmacy? Yes   This is the patient's preferred pharmacy:  [id number] [place], [place] - [address] [place] [address] Phone: [phone] Fax: [phone]  Is this the correct pharmacy for this prescription? Yes If no, delete pharmacy and type the correct one.   Has the prescription been filled recently? No  Is the patient out of the medication? Yes  Has the patient been seen for an appointment in the last year OR does the patient have an upcoming appointment? Yes  Can we respond through MyChart? Yes  Agent: Please be advised that Rx refills may take up to 3 business days. We ask that you follow-up with your pharmacy.

## 2024-06-26 ENCOUNTER — Encounter: Payer: Self-pay | Admitting: Obstetrics and Gynecology

## 2024-06-26 ENCOUNTER — Ambulatory Visit: Admitting: Obstetrics and Gynecology

## 2024-06-26 VITALS — BP 109/83 | HR 96 | Wt 183.0 lb

## 2024-06-26 DIAGNOSIS — K5901 Slow transit constipation: Secondary | ICD-10-CM | POA: Diagnosis not present

## 2024-06-26 DIAGNOSIS — N393 Stress incontinence (female) (male): Secondary | ICD-10-CM

## 2024-06-26 DIAGNOSIS — N3281 Overactive bladder: Secondary | ICD-10-CM | POA: Diagnosis not present

## 2024-06-26 DIAGNOSIS — Z87440 Personal history of urinary (tract) infections: Secondary | ICD-10-CM

## 2024-06-26 DIAGNOSIS — N8110 Cystocele, unspecified: Secondary | ICD-10-CM

## 2024-06-26 DIAGNOSIS — N390 Urinary tract infection, site not specified: Secondary | ICD-10-CM

## 2024-06-26 DIAGNOSIS — N952 Postmenopausal atrophic vaginitis: Secondary | ICD-10-CM

## 2024-06-26 DIAGNOSIS — M6289 Other specified disorders of muscle: Secondary | ICD-10-CM

## 2024-06-26 LAB — POCT URINALYSIS DIP (CLINITEK)
Bilirubin, UA: NEGATIVE
Blood, UA: NEGATIVE
Glucose, UA: NEGATIVE mg/dL
Ketones, POC UA: NEGATIVE mg/dL
Leukocytes, UA: NEGATIVE
Nitrite, UA: NEGATIVE
POC PROTEIN,UA: NEGATIVE
Spec Grav, UA: 1.01 (ref 1.010–1.025)
Urobilinogen, UA: 0.2 U/dL
pH, UA: 5.5 (ref 5.0–8.0)

## 2024-06-26 MED ORDER — METHENAMINE HIPPURATE 1 G PO TABS: 1.0000 g | ORAL_TABLET | Freq: Two times a day (BID) | ORAL | 2 refills | Status: AC

## 2024-06-26 MED ORDER — ESTRADIOL 0.01 % VA CREA: 0.5000 g | TOPICAL_CREAM | VAGINAL | 11 refills | Status: AC
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
## 2024-06-26 NOTE — Patient Instructions (Addendum)
 Please use estrogen cream nightly for the next two weeks and then twice a week after the initial two weeks. You can use your finger to do a blueberry sized amount into the vagina.   We will start with the Hiprex for prevention and then after the estrogen cream has built up move to the more natural supplement options for UTI prevention.   We can try a pessary in 4-6 weeks if you would like to support the bulge of the prolapse.   Please consider doing miralax daily to help increase the amount of water in your stool and a squatty potty for bowel support.

## 2024-07-03 ENCOUNTER — Other Ambulatory Visit: Payer: Self-pay | Admitting: Internal Medicine

## 2024-07-21 ENCOUNTER — Other Ambulatory Visit: Payer: Self-pay | Admitting: Internal Medicine

## 2024-07-21 DIAGNOSIS — E890 Postprocedural hypothyroidism: Secondary | ICD-10-CM

## 2024-07-28 DIAGNOSIS — F411 Generalized anxiety disorder: Secondary | ICD-10-CM | POA: Diagnosis not present

## 2024-07-28 DIAGNOSIS — F330 Major depressive disorder, recurrent, mild: Secondary | ICD-10-CM | POA: Diagnosis not present

## 2024-08-02 ENCOUNTER — Emergency Department (HOSPITAL_BASED_OUTPATIENT_CLINIC_OR_DEPARTMENT_OTHER)
Admission: EM | Admit: 2024-08-02 | Discharge: 2024-08-02 | Disposition: A | Discharge status: Home / Self Care | Attending: Emergency Medicine | Admitting: Emergency Medicine

## 2024-08-02 ENCOUNTER — Emergency Department (HOSPITAL_BASED_OUTPATIENT_CLINIC_OR_DEPARTMENT_OTHER)

## 2024-08-02 ENCOUNTER — Other Ambulatory Visit: Payer: Self-pay

## 2024-08-02 ENCOUNTER — Encounter (HOSPITAL_BASED_OUTPATIENT_CLINIC_OR_DEPARTMENT_OTHER): Payer: Self-pay

## 2024-08-02 DIAGNOSIS — E039 Hypothyroidism, unspecified: Secondary | ICD-10-CM | POA: Insufficient documentation

## 2024-08-02 DIAGNOSIS — Z85850 Personal history of malignant neoplasm of thyroid: Secondary | ICD-10-CM | POA: Insufficient documentation

## 2024-08-02 DIAGNOSIS — J45909 Unspecified asthma, uncomplicated: Secondary | ICD-10-CM | POA: Insufficient documentation

## 2024-08-02 DIAGNOSIS — R1030 Lower abdominal pain, unspecified: Secondary | ICD-10-CM | POA: Diagnosis present

## 2024-08-02 DIAGNOSIS — Z79899 Other long term (current) drug therapy: Secondary | ICD-10-CM | POA: Insufficient documentation

## 2024-08-02 DIAGNOSIS — N390 Urinary tract infection, site not specified: Secondary | ICD-10-CM | POA: Insufficient documentation

## 2024-08-02 LAB — BASIC METABOLIC PANEL WITH GFR
Anion gap: 13 (ref 5–15)
BUN: 19 mg/dL (ref 8–23)
CO2: 26 mmol/L (ref 22–32)
Calcium: 9.8 mg/dL (ref 8.9–10.3)
Chloride: 102 mmol/L (ref 98–111)
Creatinine, Ser: 0.88 mg/dL (ref 0.44–1.00)
GFR, Estimated: >60 mL/min
Glucose, Bld: 93 mg/dL (ref 70–99)
Potassium: 3.8 mmol/L (ref 3.5–5.1)
Sodium: 140 mmol/L (ref 135–145)

## 2024-08-02 LAB — URINALYSIS, ROUTINE W REFLEX MICROSCOPIC
Bacteria, UA: NONE SEEN
Bilirubin Urine: NEGATIVE
Glucose, UA: NEGATIVE mg/dL
Ketones, ur: NEGATIVE mg/dL
Nitrite: NEGATIVE
Protein, ur: NEGATIVE mg/dL
Specific Gravity, Urine: 1.014 (ref 1.005–1.030)
pH: 5.5 (ref 5.0–8.0)

## 2024-08-02 LAB — CBC
HCT: 40.1 % (ref 36.0–46.0)
Hemoglobin: 14.4 g/dL (ref 12.0–15.0)
MCH: 33.3 pg (ref 26.0–34.0)
MCHC: 35.9 g/dL (ref 30.0–36.0)
MCV: 92.8 fL (ref 80.0–100.0)
Platelets: 293 K/uL (ref 150–400)
RBC: 4.32 MIL/uL (ref 3.87–5.11)
RDW: 13 % (ref 11.5–15.5)
WBC: 9.3 K/uL (ref 4.0–10.5)
nRBC: 0 % (ref 0.0–0.2)

## 2024-08-02 MED ORDER — IOHEXOL 300 MG/ML  SOLN
100.0000 mL | Freq: Once | INTRAMUSCULAR | Status: AC | PRN
  Administered 2024-08-02: 100 mL via INTRAVENOUS

## 2024-08-02 MED ORDER — CEPHALEXIN 500 MG PO CAPS: 500.0000 mg | ORAL_CAPSULE | Freq: Three times a day (TID) | ORAL | 0 refills | Status: AC

## 2024-08-02 MED ORDER — SODIUM CHLORIDE 0.9 % IV SOLN
1.0000 g | Freq: Once | INTRAVENOUS | Status: AC
  Administered 2024-08-02: 1 g via INTRAVENOUS
  Filled 2024-08-02: qty 10

## 2024-08-02 NOTE — ED Triage Notes (Signed)
 PT reports positive UTI test and groin pain x3 days.

## 2024-08-02 NOTE — ED Provider Notes (Signed)
 " [facility] AT [facility] Provider Note   CSN: [id number] Arrival date & time: 08/02/24  1700     Patient presents with: Recurrent UTI   Linda Hayes is a 73 y.o. female.  {Add pertinent medical, surgical, social history, OB history to HPI:32947} HPI       73 year old female with a history of hyperlipidemia, hypothyroidism, depression, thyroid  cancer, asthmatic bronchitis, anxiety, recurrent urinary tract infection   Saw urology November 13 for recurrent UTI, pelvic organ prolapse, who was placed on Hiprex --stopped taking it, and stopped estrogen cream.  Difficulty with the applicator.    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 Authorizing Provider  ADVIL  200 MG CAPS Take 200-600 mg by mouth every 6 (six) hours as needed (for pain or headaches).    [provider]  ALPRAZolam  (XANAX ) 1 MG tablet TAKE 1 TABLET BY MOUTH TWICE A DAY 07/04/24   Plotnikov, Aleksei V, MD  Carboxymeth-Glycerin-Polysorb (REFRESH OPTIVE ADVANCED PF OP) Place 1 drop into both eyes 3 (three) times  daily as needed (FOR DRYNESS).    [provider]  Cholecalciferol (VITAMIN D3) 50 MCG (2000 UT) capsule Take 1 capsule (2,000 Units total) by mouth daily. 07/19/20   Plotnikov, Aleksei V, MD  desvenlafaxine  (PRISTIQ ) 50 MG 24 hr tablet TAKE 1 TABLET BY MOUTH EVERY DAY 05/01/24   Plotnikov, Aleksei V, MD  EPINEPHrine  (EPIPEN  2-PAK) 0.3 mg/0.3 mL IJ SOAJ injection Inject 0.3 mg into the muscle as needed for anaphylaxis. 12/03/23   Merlynn Niki FALCON, FNP  estradiol  (ESTRACE ) 0.01 % CREA vaginal cream Place 0.5 g vaginally 2 (two) times a week. Place 0.5g nightly for two weeks then twice a week after 06/26/24   Zuleta, Kaitlin G, NP  methenamine  (HIPREX ) 1 g tablet Take 1 tablet (1 g total) by mouth 2 (two) times daily with a meal. 06/26/24   Zuleta, Kaitlin G, NP  Multiple Vitamins-Minerals (CENTRUM SILVER  ADULT 50+) TABS 1 po qd 12/21/22   Plotnikov, Aleksei V, MD  OVER THE COUNTER MEDICATION Take 1-2 tablets by mouth See admin instructions. Advil  Dual Action tablets/caplets: Take 1-2 caplets by mouth every 8 hours as needed for headaches or pain    [provider]  OVER THE COUNTER MEDICATION Take 1 capsule by mouth See admin instructions. AZO Dual Protection Urinary + Vaginal Support Capsules: Take 1 capsule by mouth once a day (prebiotic + probiotic)    [provider]  SYNTHROID  125 MCG tablet TAKE 1 TABLET (125 MCG TOTAL) BY MOUTH DAILY AT 6 (SIX) AM. 07/21/24  Plotnikov, Karlynn GAILS, MD    Allergies: Ciprofloxacin, Macrobid  [nitrofurantoin ], Ambien  [zolpidem ], Cefdinir , Erythromycin, Gabapentin , Hydrocodone , Hydroxyzine , Tramadol , Trazodone  and nefazodone, Amoxicillin, Penicillins, and Ritalin  [methylphenidate]    Review of Systems  Updated Vital Signs BP 114/82   Pulse 95   Temp 99 F (37.2 C) (Oral)   Resp 18   Ht 5' 4 (1.626 m)   Wt 77.1 kg   SpO2 96%   BMI 29.18 kg/m   Physical Exam  (all labs ordered are listed, but only abnormal results are  displayed) Labs Reviewed  URINALYSIS, ROUTINE W REFLEX MICROSCOPIC - Abnormal; Notable for the following components:      Result Value   Hgb urine dipstick TRACE (*)    Leukocytes,Ua MODERATE (*)    Non Squamous Epithelial 0-5 (*)    All other components within normal limits  CBC  BASIC METABOLIC PANEL WITH GFR    EKG: None  Radiology: No results found.  {Document cardiac monitor, telemetry assessment procedure when appropriate:32947} Procedures   Medications Ordered in the ED - No data to display    {Click here for ABCD2, HEART and other calculators REFRESH Note before signing:1}                              Medical Decision Making Amount and/or Complexity of Data Reviewed Labs: ordered.   ***  {Document critical care time when appropriate  Document review of labs and clinical decision tools ie CHADS2VASC2, etc  Document your independent review of radiology images and any outside records  Document your discussion with family members, caretakers and with consultants  Document social determinants of health affecting pt's care  Document your decision making why or why not admission, treatments were needed:32947:::1}   Final diagnoses:  None    ED Discharge Orders     None        "

## 2024-08-02 NOTE — ED Notes (Signed)
 Lab called for add on urine culture. Per lab, they are running request

## 2024-08-04 LAB — URINE CULTURE: Culture: >=100000 — AB

## 2024-08-05 ENCOUNTER — Telehealth (HOSPITAL_BASED_OUTPATIENT_CLINIC_OR_DEPARTMENT_OTHER): Payer: Self-pay | Admitting: *Deleted

## 2024-08-05 NOTE — Telephone Encounter (Signed)
 Post ED Visit - Positive Culture Follow-up  Culture report reviewed by antimicrobial stewardship pharmacist: [facility] [x]  Leonor Bash, [place].D. []  Venetia Gully, Pharm.D., BCPS AQ-ID []  Garrel Crews, Pharm.D., BCPS []  Almarie Lunger, [address].D., BCPS []  [place], [place].D., BCPS, AAHIVP []  Rosaline Bihari, Pharm.D., BCPS, AAHIVP []  Vernell Meier, PharmD, BCPS []  Latanya Hint, PharmD, BCPS []  Donald Medley, PharmD, BCPS []  Rocky Bold, PharmD []  Dorothyann Alert, PharmD, BCPS []  Morene Babe, PharmD  [facility] []  Rosaline Edison, PharmD []  Romona Bliss, PharmD []  Dolphus Roller, PharmD []  Veva Seip, Rph []  Vernell Daunt) Leonce, PharmD []  Eva Allis, PharmD []  Rosaline Millet, PharmD []  Iantha Batch, PharmD []  Arvin Gauss, PharmD []  Wanda Hasting, PharmD []  Ronal Rav, PharmD []  Rocky Slade, PharmD []  Bard Jeans, PharmD   Positive urine culture Treated with Cephalexin , organism sensitive to the same and no further patient follow-up is required at this time.  Jama Wyman Kipper 08/05/2024, 12:17 PM

## 2024-08-09 ENCOUNTER — Encounter: Payer: Self-pay | Admitting: Internal Medicine

## 2024-08-11 ENCOUNTER — Ambulatory Visit: Payer: Self-pay

## 2024-08-11 NOTE — Telephone Encounter (Signed)
 FYI Only or Action Required?: FYI only for provider: ED advised.  Patient was last seen in primary care on 01/10/2024 by Plotnikov, Karlynn GAILS, MD.  Called Nurse Triage reporting Influenza.  Symptoms began several days ago.  Interventions attempted: Rest, hydration, or home remedies.  Symptoms are: unchanged.  Triage Disposition: Go to ED Now (or PCP Triage)  Patient/caregiver understands and will follow disposition?: No, refuses disposition  Copied from CRM [id number]. Topic: Clinical - Red Word Triage >> Aug 11, 2024 12:50 PM Chasity T wrote: [facility] that prompted transfer to Nurse Triage: patient is having pain in chest, chills, dry cough Reason for Disposition  Patient sounds very sick or weak to the triager  Answer Assessment - Initial Assessment Questions Pt states she thinks she has the flu. States her daughter is also sick and thinks they got it from Christmas. Symptoms started on Friday. She states she is weak, cold chills, dry cough, chest hurts with coughing, headache. She states she is taking an atbx for a uti. Se states she is drinking plenty of liquids but no appetite. She feels weak in the mornings worse, like she can hardly stand up. Rn advised ER due to symptoms and weakness. Plus she is already on antbiotic for UTI, could be systemic. Pt states she does not want to do there afraid to pick up more germs. Pt then goes to tell daughter who states I think you are over exaggerating, you have only been sick for 2 days Dtr stated she thought pt was calling to find out what over the counters she could take. Dtr wanted to know if pt could take coricidan to help with her cough. Rn advised she could and again gave recommendations for ER and as to why. Dtr states she is not that bad. Pt states if she still feels bad tomorrow she'll go.   1. SYMPTOMS: What is your main symptom or concern? (e.g., cough, fever, shortness of breath, muscle aches)     Cough, body aches, chest pain with  cough.  2. ONSET: When did the symptoms start?      Friday 3. COUGH: Do you have a cough? If Yes, ask: How bad is the cough?       Yes-  4. FEVER: Do you have a fever? If Yes, ask: What is your temperature, how was it measured, and when did it start?     denies 5. BREATHING DIFFICULTY: Are you having any difficulty breathing? (e.g., normal; shortness of breath, wheezing, unable to speak)      denies 7. OTHER SYMPTOMS: Do you have any other symptoms?  (e.g., chills, fatigue, headache, loss of smell or taste, muscle pain, sore throat)     Headache, muscle pain 8. INFLUENZA EXPOSURE: Was there any known exposure to influenza (flu) before the symptoms began?      unknown 9. INFLUENZA SUSPECTED: Why do you think you have influenza? (e.g., positive flu self-test at home, symptoms after exposure).      10. INFLUENZA VACCINE: Have you had the flu vaccine? If Yes, ask: When did you last get it?       no  Protocols used: Influenza (Flu) Suspected-A-AH

## 2024-08-19 ENCOUNTER — Ambulatory Visit: Admitting: Obstetrics and Gynecology

## 2024-08-21 ENCOUNTER — Other Ambulatory Visit: Payer: Self-pay | Admitting: Internal Medicine

## 2024-08-21 ENCOUNTER — Other Ambulatory Visit: Payer: Self-pay

## 2024-08-21 DIAGNOSIS — E890 Postprocedural hypothyroidism: Secondary | ICD-10-CM

## 2024-08-21 MED ORDER — LEVOTHYROXINE SODIUM 125 MCG PO TABS: 125.0000 ug | ORAL_TABLET | Freq: Every day | ORAL | 0 refills | Status: AC

## 2024-08-25 ENCOUNTER — Encounter: Payer: Self-pay | Admitting: *Deleted

## 2024-09-05 ENCOUNTER — Ambulatory Visit: Admitting: Obstetrics and Gynecology

## 2024-09-08 ENCOUNTER — Other Ambulatory Visit: Payer: Self-pay | Admitting: Internal Medicine

## 2024-09-14 ENCOUNTER — Other Ambulatory Visit: Payer: Self-pay | Admitting: Internal Medicine

## 2024-09-14 DIAGNOSIS — E890 Postprocedural hypothyroidism: Secondary | ICD-10-CM

## 2024-09-26 ENCOUNTER — Ambulatory Visit: Admitting: Obstetrics and Gynecology

## 2025-02-05 ENCOUNTER — Ambulatory Visit
